# Patient Record
Sex: Female | Born: 1970 | Race: Black or African American | Hispanic: No | Marital: Single | Smoking: Former smoker
Health system: Southern US, Community
[De-identification: ages and names within clinical notes are randomized; demographics above are authoritative.]

## PROBLEM LIST (undated history)

## (undated) DIAGNOSIS — C539 Malignant neoplasm of cervix uteri, unspecified: Secondary | ICD-10-CM

## (undated) DIAGNOSIS — I5032 Chronic diastolic (congestive) heart failure: Secondary | ICD-10-CM

## (undated) DIAGNOSIS — D509 Iron deficiency anemia, unspecified: Secondary | ICD-10-CM

## (undated) DIAGNOSIS — K219 Gastro-esophageal reflux disease without esophagitis: Secondary | ICD-10-CM

## (undated) DIAGNOSIS — I10 Essential (primary) hypertension: Secondary | ICD-10-CM

## (undated) HISTORY — DX: Malignant neoplasm of cervix uteri, unspecified: C53.9

## (undated) HISTORY — PX: BREAST BIOPSY: SHX20

## (undated) HISTORY — PX: BREAST SURGERY: SHX581

---

## 2013-07-07 ENCOUNTER — Ambulatory Visit
Admission: RE | Admit: 2013-07-07 | Discharge: 2013-07-07 | Disposition: A | Discharge status: Home / Self Care | Payer: No Typology Code available for payment source | Source: Ambulatory Visit | Attending: Infectious Disease | Admitting: Infectious Disease

## 2013-07-07 ENCOUNTER — Other Ambulatory Visit: Payer: Self-pay | Admitting: Infectious Disease

## 2013-07-07 DIAGNOSIS — A150 Tuberculosis of lung: Secondary | ICD-10-CM

## 2016-02-01 ENCOUNTER — Ambulatory Visit: Payer: Self-pay | Admitting: Women's Health

## 2016-08-31 ENCOUNTER — Ambulatory Visit (HOSPITAL_COMMUNITY)
Admission: EM | Admit: 2016-08-31 | Discharge: 2016-08-31 | Disposition: A | Discharge status: Home / Self Care | Payer: Managed Care, Other (non HMO) | Attending: Family Medicine | Admitting: Family Medicine

## 2016-08-31 ENCOUNTER — Encounter (HOSPITAL_COMMUNITY): Payer: Self-pay | Admitting: Emergency Medicine

## 2016-08-31 DIAGNOSIS — M62838 Other muscle spasm: Secondary | ICD-10-CM

## 2016-08-31 DIAGNOSIS — I10 Essential (primary) hypertension: Secondary | ICD-10-CM

## 2016-08-31 HISTORY — DX: Essential (primary) hypertension: I10

## 2016-08-31 MED ORDER — DICLOFENAC SODIUM 1 % TD GEL: 4.0000 g | Freq: Four times a day (QID) | TRANSDERMAL | 2 refills | Status: DC

## 2016-08-31 MED ORDER — CYCLOBENZAPRINE HCL 10 MG PO TABS: ORAL_TABLET | ORAL | 0 refills | Status: DC

## 2016-08-31 NOTE — ED Notes (Signed)
Here for neck/upper back pain onset yest... Reports pain increase when she turns her head  Denies inj/trauma, strenuous activity, urinary or bowel incontinence.   Slow gait... A&O x4... NAD

## 2016-08-31 NOTE — Discharge Instructions (Signed)
You are having neck spasms. Treat with moist heat, use of Voltaren gel locally to area 3-4 times a day and use of muscle relaxer's (Flexeril) as needed. If you are not improving then suggest you f/u with Orthopedics.  From a blood pressure standpoint suggest establishing care with a PCP for management. Avoid high salt diet and no anti-inflammatory's like aleve or ibuprofen which can make you BP worse.

## 2016-08-31 NOTE — ED Provider Notes (Signed)
CSN: [id number]     Arrival date & time 08/31/16  1445 History   First MD Initiated Contact with Patient 08/31/16 1510     Chief Complaint  Patient presents with  . Neck Pain   (Consider location/radiation/quality/duration/timing/severity/associated sxs/prior Treatment) Patient is a 45 yo black female who carries a history of HTN. She presents today with neck pain and spasms. She reports that she awoke 2 days ago with pain upon ROM of her neck. She felt as though she "slept wrong". The pain is worsening. She reports taking Tylenol without good relief. She has no radiation of her pain into extremities. No numbness, tingling or weakness.  She reports that she has been managing her BP since arrival to the [place] 3 years ago. She has not established care with PCP. She states that her BPs are usually lower (she works as a Technical brewer and checks it often). No chest pain or dizziness. No headaches are noted.       Past Medical History:  Diagnosis Date  . Hypertension    History reviewed. No pertinent surgical history. No family history on file. Social History  Substance Use Topics  . Smoking status: Current Every Day Smoker    Packs/day: 0.50    Types: Cigarettes  . Smokeless tobacco: Never Used  . Alcohol use Yes   OB History    No data available     Review of Systems  Constitutional: Negative for chills and fever.  Eyes: Negative for visual disturbance.  Respiratory: Negative for chest tightness and shortness of breath.   Musculoskeletal: Positive for neck pain and neck stiffness.  Skin: Negative.   Neurological: Negative for dizziness, light-headedness, numbness and headaches.  Psychiatric/Behavioral: Negative.   All other systems reviewed and are negative.   Allergies  Patient has no known allergies.  Home Medications   Prior to Admission medications   Medication Sig Start Date End Date Taking? Authorizing Provider  cyclobenzaprine (FLEXERIL) 10 MG tablet 1/2 to 1 tablet every 8  hours prn muscle spasm and pain 08/31/16   Bjorn Pippin, PA-C  diclofenac sodium (VOLTAREN) 1 % GEL Apply 4 g topically 4 (four) times daily. 08/31/16   Bjorn Pippin, PA-C   Meds Ordered and Administered this Visit  Medications - No data to display  BP (!) 168/104 (BP Location: Left Arm) Comment: notified cma  Pulse 87   Temp 97.9 F (36.6 C) (Oral)   Resp 16   LMP 08/31/2016   SpO2 100%  No data found.   Physical Exam  Constitutional: She is oriented to person, place, and time. She appears well-developed and well-nourished. No distress.  HENT:  Head: Normocephalic and atraumatic.  Neck: Neck supple.  Tenderness along the right SCM with pain to limited ROM of the cervical spine.   Musculoskeletal:  Full ROm of the shoulders, good strength throughout upper extremities  Neurological: She is alert and oriented to person, place, and time. She displays normal reflexes. No sensory deficit. She exhibits normal muscle tone. Coordination normal.  Skin: Skin is warm and dry. No rash noted. She is not diaphoretic.  Psychiatric: Her behavior is normal.  Nursing note and vitals reviewed.   Urgent Care Course   Clinical Course     Procedures (including critical care time)  Labs Review Labs Reviewed - No data to display  Imaging Review No results found.   Visual Acuity Review  Right Eye Distance:   Left Eye Distance:   Bilateral Distance:  Right Eye Near:   Left Eye Near:    Bilateral Near:         MDM   1. Muscle spasms of neck   2. Elevated blood pressure reading with diagnosis of hypertension    1. Treat with muscle relaxer's, moist heat, gentle ROM. Work note given. Avoid NSAIDs in the setting of HTN. FU here or with Ortho if not improving. No indication for xray's.  2. Asymptomatic. Instructed to f/u with a PCP (some names given) ASAP for management of BP.     Bjorn Pippin, PA-C 08/31/16 [phone]

## 2017-12-25 ENCOUNTER — Other Ambulatory Visit: Payer: Self-pay | Admitting: Cardiovascular Disease

## 2017-12-25 ENCOUNTER — Ambulatory Visit
Admission: RE | Admit: 2017-12-25 | Discharge: 2017-12-25 | Disposition: A | Discharge status: Home / Self Care | Payer: 59 | Source: Ambulatory Visit | Attending: Cardiovascular Disease | Admitting: Cardiovascular Disease

## 2017-12-25 DIAGNOSIS — R05 Cough: Secondary | ICD-10-CM

## 2017-12-25 DIAGNOSIS — R059 Cough, unspecified: Secondary | ICD-10-CM

## 2018-05-27 ENCOUNTER — Ambulatory Visit (INDEPENDENT_AMBULATORY_CARE_PROVIDER_SITE_OTHER): Payer: 59 | Admitting: Family Medicine

## 2018-05-27 ENCOUNTER — Other Ambulatory Visit: Payer: Self-pay

## 2018-05-27 ENCOUNTER — Encounter: Payer: Self-pay | Admitting: Family Medicine

## 2018-05-27 VITALS — BP 162/115 | HR 69 | Temp 97.9°F | Resp 16 | Ht 67.0 in | Wt 255.6 lb

## 2018-05-27 DIAGNOSIS — Z833 Family history of diabetes mellitus: Secondary | ICD-10-CM

## 2018-05-27 DIAGNOSIS — Z131 Encounter for screening for diabetes mellitus: Secondary | ICD-10-CM | POA: Diagnosis not present

## 2018-05-27 DIAGNOSIS — R42 Dizziness and giddiness: Secondary | ICD-10-CM | POA: Diagnosis not present

## 2018-05-27 DIAGNOSIS — I10 Essential (primary) hypertension: Secondary | ICD-10-CM

## 2018-05-27 DIAGNOSIS — I517 Cardiomegaly: Secondary | ICD-10-CM

## 2018-05-27 DIAGNOSIS — R600 Localized edema: Secondary | ICD-10-CM

## 2018-05-27 NOTE — Patient Instructions (Addendum)
IF you received an x-ray today, you will receive an invoice from [facility]. Please contact [facility] at [phone] with questions or concerns regarding your invoice.   IF you received labwork today, you will receive an invoice from [facility]. Please contact [facility] at [phone] with questions or concerns regarding your invoice.   Our billing staff will not be able to assist you with questions regarding bills from these companies.  You will be contacted with the lab results as soon as they are available. The fastest way to get your results is to activate your My Chart account. Instructions are located on the last page of this paperwork. If you have not heard from [place] regarding the results in 2 weeks, please contact this office.    We recommend that you schedule a mammogram for breast cancer screening. Typically, you do not need a referral to do this. Please contact a local imaging center to schedule your mammogram.  [facility] - [phone]  *ask for the Radiology Department [facility] ([place]) - [phone] or [phone]  [facility] - [phone] [place] [phone] [facility] - [phone]  *ask for the [facility] - [phone]  *ask for the Radiology Department [facility] - [phone]  *ask for the [facility] - [phone] Cancer Screening for Women A cancer screening is a test or exam that checks for cancer. Your health care provider will recommend specific cancer screenings based on your age, personal history, and family history of cancer. Work with your health care provider to create a cancer screening schedule that protects your health. Why is cancer screening done? Cancer screening is done to look for cancer in the very early stages, before it spreads and becomes harder to treat and  before you would start to notice symptoms. Finding cancer early improves the chances of successful treatment. It may save your life. Who should be screened for cancer? All women should be screened for certain cancers, including breast cancer, cervical cancer, and skin cancer. Your health care provider may recommend screenings for other types of cancer if:  You had cancer before.  You have a family member with cancer.  You have abnormal genes that could increase the risk of cancer.  You have risk factors for certain cancers, such as smoking.  When you should be screened for cancer depends on:  Your age.  Your medical history and your family's medical history.  Certain lifestyle factors, such as smoking.  Environmental exposure, such as to asbestos.  What are some common cancer screenings? Breast cancer Breast cancer screening is done with a test that takes images of breast tissue (mammogram). Here are some screening guidelines:  When you are age 88-44. you will be given the choice to start having mammograms.  When you are age 51-54, you should have a mammogram every year.  You may start having mammograms before age 38 if you have risk factors for breast cancer, such as having an immediate family member with breast cancer.  When you are age 47 or older, you should have a mammogram every 1-2 years for as long as you are in good health and have a life expectancy of 10 years or more.  It is important to know what your breasts look and feel like so you can report any changes to your health care provider.  Cervical cancer Cervical cancer screening  is done with a Pap test. This testchecks for abnormalities, including the virus that causes cervical cancer (human papillomavirus, or HPV). To perform the test, a health care provider takes a swab of cervical cells during a pelvic exam. Screening for cervical cancer with a Pap test should start at age 63. Here are some screening  guidelines:  When you are age 49-29, you should have a Pap test every 3 years.  When you are age 70-65, you should have a Pap test and HPV test every 5 years or have a Pap test every 3 years.  You may be screened for cervical cancer more often if you have risk factors for cervical cancer.  If your Pap tests are abnormal, you may have an HPV test.  If you have had the HPV vaccine, you will still be screened for cervical cancer and follow normal screening recommendations.  You do not need to be screened for cervical cancer if any of the following apply to you:  You are older than age 76 and you have not had a serious cervical precancer or cancer in the last 20 years.  Your cervix and uterus have been removed and you have never had cervical cancer or precancerous cells.  Endometrial cancer There is no standard screening test for endometrial cancer, but the cancer can be detected with:  A test of a sample of tissue taken from the lining of the uterus (endometrial tissue biopsy).  A vaginal ultrasound.  Pap tests.  If you are at increased risk for endometrial cancer, you may need to have these tests more often than normal. You are at increased risk if:  You have a family history of ovarian, uterine, or colon cancer.  You are taking tamoxifen, a drug that is used to treat breast cancer.  You have certain types of colon cancer.  If you have reached menopause, it is especially important to talk with your health care provider about any vaginal bleeding or spotting. Screening for endometrial cancer is not recommended for women who do not have symptoms of the cancer, such as vaginal bleeding. Colorectal cancer Screening for colorectal cancer is recommended starting at age 67 for most women. If you have a family history of colon or rectal cancer or other risk factors, you may need to start having screenings earlier. Talk with your health care provider about which screening test is right for  you and how often you should be screened. Colorectal cancer screening looks for cancer or for growths called polyps that often form before cancer starts. Tests to look for cancer or polyps include:  Colonoscopy or flexible sigmoidoscopy. For these procedures, a flexible tube with a small camera is inserted into the rectum.  CT colonography. This test uses X-rays and a contrast dye to check the colon for polyps. If a polyp is found, you may need to have a colonoscopy so the polyp can be located and removed.  Tests to look for cancer in the stool (feces) include:  Guaiac-based fecal occult blood test (FOBT). This test detects blood in stool. It can be done at home with a kit.  Fecal immunochemical test (FIT). This test detects blood in stool. For this test, you will need to collect stool samples at home.  Stool DNA test. This test looks for blood in stool and any changes in DNA that can lead to colon cancer. For this test, you will need to collect a stool sample at home and send it to a lab.  Skin cancer Skin cancer screening is done by checking the skin for unusual moles or spots and any changes in existing moles. Your health care provider should check your skin for signs of skin cancer at every physical exam. You should check your skin every month and tell your health care provider right away if anything looks unusual. Women with a higher-than-normal risk for skin cancer may want to see a skin specialist (dermatologist) for an annual body check. Lung cancer Lung cancer screening is done with a CT scan that looks for abnormal cells in the lungs. Discuss lung cancer screening with your health care provider if you are 13-29 years old and if any of the following apply to you:  You currently smoke.  You used to smoke heavily.  You have had at least a 30-pack-year smoking history.  You have quit smoking within the past 15 years.  If you smoke heavily or if you used to smoke, you may need to be  screened every year. Where to find more information:  [place]: SkinPromotion.no  Centers for Disease Control and Prevention: http://knight-sullivan.biz/  Department of Health and Human Services: BankingDetective.si Contact a health care provider if:  You have concerns about any signs or symptoms of cancer, such as: ? Moles that have an unusual shape or color. ? Changes in existing moles. ? A sore on your skin that does not heal. ? Blood in your stool. ? Fatigue that does not go away. ? Frequent pain or cramping in your abdomen. ? Coughing, or coughing up blood. ? Losing weight without trying. ? Lumps or other changes in your breasts. ? Vaginal bleeding, spotting, or changes in your periods. Summary  Be aware of and watch for signs and symptoms of cancer, especially symptoms of breast cancer, cervical cancer, endometrial cancer, colorectal cancer, skin cancer, and lung cancer.  Early detection of cancer with cancer screening may save your life.  Talk with your health care provider about your specific cancer risks.  Work together with your health care provider to create a cancer screening plan that is right for you. This information is not intended to replace advice given to you by your health care provider. Make sure you discuss any questions you have with your health care provider. Document Released: 06/28/2016 Document Revised: 06/28/2016 Document Reviewed: 06/28/2016 Elsevier Interactive Patient Education  [employer].

## 2018-05-27 NOTE — Progress Notes (Signed)
Chief Complaint  Patient presents with  . New Patient (Initial Visit)    HPI  Hypertension: Patient here to establish care for Hypertension.   She reports that even when she takes all her medication he pressure is still high She reports that she gets some chest tightness even when she takes her medications Her bp makes her feel tired and dizzy She had a stress test with the Cardiologist. Her last visit was 2 week ago. 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   Health Maintenance She reports that she has not been checked for diabetes or high cholesterol She has not had a mammogram and pap smear She has been [id number], one passed away, 23 78 son and 17 102 daughter   Past Medical History:  Diagnosis Date  . Hypertension     Current Outpatient Medications  Medication Sig Dispense Refill  . albuterol (PROVENTIL HFA;VENTOLIN HFA) 108 (90 Base) MCG/ACT inhaler Inhale into the lungs every 6 (six) hours as needed for wheezing or shortness of breath.    . aMarland KitchenLODipine (NORVASC) 5 MG tablet Take 5 mg by mouth daily.    . hydrALAZINE (APRESOLINE) 25 MG tablet Take 25 mg by mouth 3 (three) times daily.    . lMarland Kitchensartan-hydrochlorothiazide (HYZAAR) 100-12.5 MG tablet Take 1 tablet by mouth daily.    . metoprolol tartrate (LOPRESSOR) 25 MG tablet Take 25 mg by mouth 2  (two) times daily.    . potassium chloride (K-DUR) 10 MEQ tablet Take 10 mEq by mouth daily.    . cyclobenzaprine (FLEXERIL) 10 MG tablet 1/2 to 1 tablet every 8 hours prn muscle spasm and pain (Patient not taking: Reported on 05/27/2018) 20 tablet 0  . diclofenac sodium (VOLTAREN) 1 % GEL Apply 4 g topically 4 (four) times daily. (Patient not taking: Reported on 05/27/2018) 1 Tube 2   No current facility-administered medications for this visit.     Allergies: No Known Allergies  No past surgical history on file.  Social History   Socioeconomic History  . Marital status: Single    Spouse name: Not on file  . Number of children: Not on file  . Years of education: Not on file  . Highest education level: Not on file  Occupational History  . Not on file  Social Needs  . Financial resource strain: Not on file  . Food insecurity:    Worry: Not on file    Inability: Not on file  . Transportation needs:    Medical: Not on file    Non-medical: Not on file  Tobacco Use  . Smoking status: Never Smoker  . Smokeless tobacco: Never Used  Substance and Sexual Activity  . Alcohol use: Yes  . Drug use: Never  . Sexual activity: Not on file  Lifestyle  .  Physical activity:    Days per week: Not on file    Minutes per session: Not on file  . Stress: Not on file  Relationships  . Social connections:    Talks on phone: Not on file    Gets together: Not on file    Attends religious service: Not on file    Active member of club or organization: Not on file    Attends meetings of clubs or organizations: Not on file    Relationship status: Not on file  Other Topics Concern  . Not on file  Social History Narrative  . Not on file    Family History  Problem Relation Age of Onset  . Hypertension Mother   . Hypertension Father   . Diabetes Father      ROS Review of Systems See HPI Constitution: No fevers or chills No malaise No diaphoresis Skin: No rash or itching Eyes: no  blurry vision, no double vision GU: no dysuria or hematuria Neuro: no dizziness or headaches  all others reviewed and negative   Objective: Vitals:   05/27/18 1133  BP: (!) 162/115  Pulse: 69  Resp: 16  Temp: 97.9 F (36.6 C)  TempSrc: Oral  SpO2: 98%  Weight: 255 lb 9.6 oz (115.9 kg)  Height: _0  (1.702 m)    Physical Exam  Constitutional: She is oriented to person, place, and time. She appears well-developed and well-nourished.  HENT:  Head: Normocephalic and atraumatic.  Right Ear: External ear normal.  Left Ear: External ear normal.  Nose: Nose normal.  Mouth/Throat: Oropharynx is clear and moist.  Eyes: Conjunctivae and EOM are normal.  Neck: Normal range of motion. Neck supple.  Cardiovascular: Normal rate, regular rhythm and normal heart sounds.  No murmur heard. Pulmonary/Chest: Effort normal and breath sounds normal. No stridor. No respiratory distress. She has no wheezes.  Abdominal: Soft. Bowel sounds are normal. She exhibits no distension and no mass. There is no tenderness. There is no guarding.  Musculoskeletal: She exhibits edema.  Pitting edema to the ankle, trace from the ankle to mid leg bilaterally  Neurological: She is alert and oriented to person, place, and time.  Skin: Skin is warm. Capillary refill takes less than 2 seconds.  Psychiatric: She has a normal mood and affect. Her behavior is normal. Judgment and thought content normal.   ECG- LVH criteria met, no st elevation     CLINICAL DATA:  Cough.  EXAM: CHEST - 2 VIEW  COMPARISON:  04/06/2013.  FINDINGS: Mediastinum hilar structures normal. Lungs are clear. Stable cardiomegaly. No pulmonary venous congestion. No acute bony abnormality.  IMPRESSION: 1.  Stable cardiomegaly.  No pulmonary venous congestion.  2.  No acute pulmonary disease P   Electronically Signed   By: Marcello Moores  Register   On: 12/26/2017 07:31   Assessment and Plan Aerielle was seen today for new  patient (initial visit).  Diagnoses and all orders for this visit:  Malignant hypertension- will get records from cardiology Close follow up in one month Pt recently had meds changed by cardiology -     EKG 12-Lead -     Comprehensive metabolic panel -     Microalbumin, urine -     Lipid panel  Cardiomegaly- will get records from Cardiology  Dizziness -     EKG 12-Lead  Screening for diabetes mellitus -     Hemoglobin A1c  Family history of diabetes mellitus in father -     Hemoglobin A1c  Severe obesity (BMI >= 40) ([place])- will review stress test result before recommending exercise program   Lower extremity edema- advised DASH diet, compression stockings Waiting on labs      [place]

## 2018-05-28 LAB — LIPID PANEL
CHOL/HDL RATIO: 3.8 ratio (ref 0.0–4.4)
Cholesterol, Total: 184 mg/dL (ref 100–199)
HDL: 48 mg/dL (ref >39)
LDL Calculated: 125 mg/dL — ABNORMAL HIGH (ref 0–99)
TRIGLYCERIDES: 54 mg/dL (ref 0–149)
VLDL Cholesterol Cal: 11 mg/dL (ref 5–40)

## 2018-05-28 LAB — COMPREHENSIVE METABOLIC PANEL
ALT: 16 IU/L (ref 0–32)
AST: 20 IU/L (ref 0–40)
Albumin/Globulin Ratio: 1.5 (ref 1.2–2.2)
Albumin: 4.3 g/dL (ref 3.5–5.5)
Alkaline Phosphatase: 53 IU/L (ref 39–117)
BILIRUBIN TOTAL: 0.3 mg/dL (ref 0.0–1.2)
BUN/Creatinine Ratio: 20 (ref 9–23)
BUN: 12 mg/dL (ref 6–24)
CHLORIDE: 102 mmol/L (ref 96–106)
CO2: 26 mmol/L (ref 20–29)
Calcium: 9.4 mg/dL (ref 8.7–10.2)
Creatinine, Ser: 0.59 mg/dL (ref 0.57–1.00)
GFR calc non Af Amer: 109 mL/min/{1.73_m2} (ref >59)
GFR, EST AFRICAN AMERICAN: 126 mL/min/{1.73_m2} (ref >59)
GLOBULIN, TOTAL: 2.8 g/dL (ref 1.5–4.5)
Glucose: 86 mg/dL (ref 65–99)
Potassium: 4.2 mmol/L (ref 3.5–5.2)
SODIUM: 140 mmol/L (ref 134–144)
TOTAL PROTEIN: 7.1 g/dL (ref 6.0–8.5)

## 2018-05-28 LAB — HEMOGLOBIN A1C
ESTIMATED AVERAGE GLUCOSE: 108 mg/dL
HEMOGLOBIN A1C: 5.4 % (ref 4.8–5.6)

## 2018-05-28 LAB — MICROALBUMIN, URINE: Microalbumin, Urine: 34.7 ug/mL

## 2018-06-05 ENCOUNTER — Encounter: Payer: Self-pay | Admitting: Family Medicine

## 2018-06-16 ENCOUNTER — Ambulatory Visit (INDEPENDENT_AMBULATORY_CARE_PROVIDER_SITE_OTHER): Payer: 59

## 2018-06-16 ENCOUNTER — Ambulatory Visit (HOSPITAL_COMMUNITY)
Admission: EM | Admit: 2018-06-16 | Discharge: 2018-06-16 | Disposition: A | Discharge status: Home / Self Care | Payer: 59 | Attending: Family Medicine | Admitting: Family Medicine

## 2018-06-16 ENCOUNTER — Encounter (HOSPITAL_COMMUNITY): Payer: Self-pay

## 2018-06-16 DIAGNOSIS — R52 Pain, unspecified: Secondary | ICD-10-CM

## 2018-06-16 DIAGNOSIS — M25551 Pain in right hip: Secondary | ICD-10-CM | POA: Diagnosis not present

## 2018-06-16 MED ORDER — NAPROXEN 500 MG PO TABS: 500.0000 mg | ORAL_TABLET | Freq: Two times a day (BID) | ORAL | 0 refills | Status: DC

## 2018-06-16 MED ORDER — KETOROLAC TROMETHAMINE 30 MG/ML IJ SOLN
INTRAMUSCULAR | Status: AC
  Filled 2018-06-16: qty 1

## 2018-06-16 MED ORDER — KETOROLAC TROMETHAMINE 30 MG/ML IJ SOLN
30.0000 mg | Freq: Once | INTRAMUSCULAR | Status: AC
  Administered 2018-06-16: 30 mg via INTRAMUSCULAR

## 2018-06-16 NOTE — ED Notes (Signed)
donjoy crutches given.

## 2018-06-16 NOTE — ED Provider Notes (Signed)
[place]    CSN: [id number] Arrival date & time: 06/16/18  1517     History   Chief Complaint Chief Complaint  Patient presents with  . Leg Pain    Right  . Knee Pain    Right    HPI Kayla Carey is a 47 y.o. female.   The history is provided by the patient. No language interpreter was used.  Hip Pain  This is a new problem. The current episode started yesterday (She was trying to get into the car yesterday and she twisted her right hip wrong and heard a pop. Since then she has been in pain associated with right hip swelling and difficulty ambulating). The problem occurs constantly. The problem has been gradually worsening. The symptoms are aggravated by bending, twisting, standing and walking. The symptoms are relieved by rest. Treatments tried: Muscle relaxant. The treatment provided mild relief.  HTN: On medication. She missed her PM dose today due to severe pain.Denies any other symptoms.  Past Medical History:  Diagnosis Date  . Hypertension     There are no active problems to display for this patient.   History reviewed. No pertinent surgical history.  OB History   None      Home Medications    Prior to Admission medications   Medication Sig Start Date End Date Taking? Authorizing Provider  albuterol (PROVENTIL HFA;VENTOLIN HFA) 108 (90 Base) MCG/ACT inhaler Inhale into the lungs every 6 (six) hours as needed for wheezing or shortness of breath.    [provider]  amLODipine (NORVASC) 5 MG tablet Take 5 mg by mouth daily.    [provider]  cyclobenzaprine (FLEXERIL) 10 MG tablet 1/2 to 1 tablet every 8 hours prn muscle spasm and pain Patient not taking: Reported on 05/27/2018 08/31/16   Bjorn Pippin, PA-C  diclofenac sodium (VOLTAREN) 1 % GEL Apply 4 g topically 4 (four) times daily. Patient not taking: Reported on 05/27/2018 08/31/16   Bjorn Pippin, PA-C  hydrALAZINE (APRESOLINE) 25 MG tablet Take 25 mg by mouth  3 (three) times daily.    [provider]  losartan-hydrochlorothiazide (HYZAAR) 100-12.5 MG tablet Take 1 tablet by mouth daily.    [provider]  metoprolol tartrate (LOPRESSOR) 25 MG tablet Take 25 mg by mouth 2 (two) times daily.    [provider]  potassium chloride (K-DUR) 10 MEQ tablet Take 10 mEq by mouth daily.    [provider]    Family History Family History  Problem Relation Age of Onset  . Hypertension Mother   . Hypertension Father   . Diabetes Father     Social History Social History   Tobacco Use  . Smoking status: Never Smoker  . Smokeless tobacco: Never Used  Substance Use Topics  . Alcohol use: Yes  . Drug use: Never     Allergies   Patient has no known allergies.   Review of Systems Review of Systems  Respiratory: Negative.   Cardiovascular: Negative.   Gastrointestinal: Negative.   Musculoskeletal: Positive for arthralgias, gait problem and joint swelling.       Right hip pain and swelling  All other systems reviewed and are negative.    Physical Exam Triage Vital Signs ED Triage Vitals  Enc Vitals Group     BP 06/16/18 1604 (!) 148/101     Pulse Rate 06/16/18 1604 80     Resp 06/16/18 1604 20     Temp 06/16/18 1604 98.3  F (36.8 C)     Temp Source 06/16/18 1604 Oral     SpO2 06/16/18 1604 100 %     Weight --      Height --      Head Circumference --      Peak Flow --      Pain Score 06/16/18 1603 10     Pain Loc --      Pain Edu? --      Excl. in [place]? --    No data found.  Updated Vital Signs BP (!) 148/101 (BP Location: Left Arm)   Pulse 80   Temp 98.3 F (36.8 C) (Oral)   Resp 20   SpO2 100%   Visual Acuity Right Eye Distance:   Left Eye Distance:   Bilateral Distance:    Right Eye Near:   Left Eye Near:    Bilateral Near:     Physical Exam  Constitutional:  In mild distress due to pain  Cardiovascular: Normal rate and regular rhythm.  No murmur heard. Pulmonary/Chest:  Effort normal and breath sounds normal. No stridor. No respiratory distress. She has no wheezes.  Abdominal: Soft. Bowel sounds are normal. She exhibits no distension. There is no tenderness.  Musculoskeletal:       Right hip: She exhibits decreased range of motion, tenderness and swelling. She exhibits normal strength, no crepitus and no deformity.       Left hip: Normal.       Right knee: Normal.       Left knee: Normal.  Neurological: She displays no tremor. No sensory deficit. Gait abnormal.  Antalgic gait favoring right LL due to pain  Nursing note and vitals reviewed.    UC Treatments / Results  Labs (all labs ordered are listed, but only abnormal results are displayed) Labs Reviewed - No data to display  EKG None  Radiology No results found.  Procedures Procedures (including critical care time)  Medications Ordered in UC Medications - No data to display  Initial Impression / Assessment and Plan / UC Course  I have reviewed the triage vital signs and the nursing notes.  Pertinent labs & imaging results that were available during my care of the patient were reviewed by me and considered in my medical decision making (see chart for details).  Clinical Course as of Jun 17 1703  Mon Jun 16, 2018  1702 Hip xray neg for dislocation. As discussed with her, MRI might be the best option given her pain.  She declined ED visit at this point. Naproxen prescribed prn pain. Crutches given. Return precaution discussed.   [KE]  1703 HTN: Worsened by pain. Also did not take her meds. Advised to take BP meds as soon as possible. She agreed with the plan./   [KE]    Clinical Course User Index [KE] Kinnie Feil, MD   Dg Hip Unilat With Pelvis 2-3 Views Right  Result Date: 06/16/2018 CLINICAL DATA:  Twisted the right leg now with right hip pain EXAM: DG HIP (WITH OR WITHOUT PELVIS) 2-3V RIGHT COMPARISON:  None. FINDINGS: SI joints are non widened. Pubic symphysis and rami  appear intact. No fracture or malalignment. IMPRESSION: No acute osseous abnormality. Electronically Signed   By: Donavan Foil M.D.   On: 06/16/2018 16:47    Pain of right hip joint  Pain - Plan: DG Hip Unilat With Pelvis 2-3 Views Right, DG Hip Unilat With Pelvis 2-3 Views Right   Final Clinical Impressions(s) / UC  Diagnoses   Final diagnoses:  None   Discharge Instructions   None    ED Prescriptions    None     Controlled Substance Prescriptions [place] Controlled Substance Registry consulted? Not Applicable   Kinnie Feil, MD 06/16/18 1704

## 2018-06-16 NOTE — ED Triage Notes (Signed)
Pt presents with pain in right thigh and knee from turning the wrong way to get into car yesterday.

## 2018-06-16 NOTE — Discharge Instructions (Addendum)
It was nice seeing you today. Your hip xray is negative,. Use Naproxen as needed for pain. Keep minimal weight on your hip. Please go to the ED for MRI of your hip if pain is not improving.

## 2018-06-25 ENCOUNTER — Other Ambulatory Visit: Payer: Self-pay

## 2018-06-25 ENCOUNTER — Ambulatory Visit (INDEPENDENT_AMBULATORY_CARE_PROVIDER_SITE_OTHER): Payer: 59 | Admitting: Family Medicine

## 2018-06-25 ENCOUNTER — Encounter: Payer: Self-pay | Admitting: Family Medicine

## 2018-06-25 VITALS — BP 147/89 | HR 88 | Temp 99.0°F | Resp 17 | Ht 67.0 in | Wt 256.6 lb

## 2018-06-25 DIAGNOSIS — Z0000 Encounter for general adult medical examination without abnormal findings: Secondary | ICD-10-CM | POA: Diagnosis not present

## 2018-06-25 DIAGNOSIS — Z01419 Encounter for gynecological examination (general) (routine) without abnormal findings: Secondary | ICD-10-CM

## 2018-06-25 DIAGNOSIS — R87810 Cervical high risk human papillomavirus (HPV) DNA test positive: Secondary | ICD-10-CM

## 2018-06-25 DIAGNOSIS — R87613 High grade squamous intraepithelial lesion on cytologic smear of cervix (HGSIL): Secondary | ICD-10-CM

## 2018-06-25 NOTE — Patient Instructions (Addendum)
We recommend that you schedule a mammogram for breast cancer screening. Typically, you do not need a referral to do this. Please contact a local imaging center to schedule your mammogram.  [facility] - [phone]  *ask for the Radiology [place] ([place]) - [phone] or [phone]  [facility] - [phone] [phone] [facility] - [phone]  *ask for the [facility] - [phone]  *ask for the Radiology Department [facility] - [phone]  *ask for the [place] - [phone]     If you have lab work done today you will be contacted with your lab results within the next 2 weeks.  If you have not heard from [place] then please contact us. The fastest way to get your results is to register for My Chart.   IF you received an x-ray today, you will receive an invoice from [facility]. Please contact [facility] Radiology at [phone] with questions or concerns regarding your invoice.   IF you received labwork today, you will receive an invoice from [place]. Please contact [facility] at [phone] with questions or concerns regarding your invoice.   Our billing staff will not be able to assist you with questions regarding bills from these companies.  You will be contacted with the lab results as soon as they are available. The fastest way to get your results is to activate your My Chart account. Instructions are located on the last page of this paperwork. If you have not heard from [place] regarding the results in 2 weeks, please contact this office.    Health Maintenance, Female Adopting a healthy lifestyle and getting preventive care can go a long way to promote health and wellness. Talk with your health care provider about what schedule of regular examinations is right for you. This is a good  chance for you to check in with your provider about disease prevention and staying healthy. In between checkups, there are plenty of things you can do on your own. Experts have done a lot of research about which lifestyle changes and preventive measures are most likely to keep you healthy. Ask your health care provider for more information. Weight and diet Eat a healthy diet  Be sure to include plenty of vegetables, fruits, low-fat dairy products, and lean protein.  Do not eat a lot of foods high in solid fats, added sugars, or salt.  Get regular exercise. This is one of the most important things you can do for your health. ? Most adults should exercise for at least 150 minutes each week. The exercise should increase your heart rate and make you sweat (moderate-intensity exercise). ? Most adults should also do strengthening exercises at least twice a week. This is in addition to the moderate-intensity exercise.  Maintain a healthy weight  Body mass index (BMI) is a measurement that can be used to identify possible weight problems. It estimates body fat based on height and weight. Your health care provider can help determine your BMI and help you achieve or maintain a healthy weight.  For females 51 years of age and older: ? A BMI below 18.5 is considered underweight. ? A BMI of 18.5 to 24.9 is normal. ? A BMI of 25 to 29.9 is considered overweight. ? A BMI of 30 and above is considered obese.  Watch levels of cholesterol and blood lipids  You should start having  your blood tested for lipids and cholesterol at 47 years of age, then have this test every 5 years.  You may need to have your cholesterol levels checked more often if: ? Your lipid or cholesterol levels are high. ? You are older than 47 years of age. ? You are at high risk for heart disease.  Cancer screening Lung Cancer  Lung cancer screening is recommended for adults 29-25 years old who are at high risk for lung cancer  because of a history of smoking.  A yearly low-dose CT scan of the lungs is recommended for people who: ? Currently smoke. ? Have quit within the past 15 years. ? Have at least a 30-pack-year history of smoking. A pack year is smoking an average of one pack of cigarettes a day for 1 year.  Yearly screening should continue until it has been 15 years since you quit.  Yearly screening should stop if you develop a health problem that would prevent you from having lung cancer treatment.  Breast Cancer  Practice breast self-awareness. This means understanding how your breasts normally appear and feel.  It also means doing regular breast self-exams. Let your health care provider know about any changes, no matter how small.  If you are in your 20s or 30s, you should have a clinical breast exam (CBE) by a health care provider every 1-3 years as part of a regular health exam.  If you are 44 or older, have a CBE every year. Also consider having a breast X-ray (mammogram) every year.  If you have a family history of breast cancer, talk to your health care provider about genetic screening.  If you are at high risk for breast cancer, talk to your health care provider about having an MRI and a mammogram every year.  Breast cancer gene (BRCA) assessment is recommended for women who have family members with BRCA-related cancers. BRCA-related cancers include: ? Breast. ? Ovarian. ? Tubal. ? Peritoneal cancers.  Results of the assessment will determine the need for genetic counseling and BRCA1 and BRCA2 testing.  Cervical Cancer Your health care provider may recommend that you be screened regularly for cancer of the pelvic organs (ovaries, uterus, and vagina). This screening involves a pelvic examination, including checking for microscopic changes to the surface of your cervix (Pap test). You may be encouraged to have this screening done every 3 years, beginning at age 38.  For women ages 37-65,  health care providers may recommend pelvic exams and Pap testing every 3 years, or they may recommend the Pap and pelvic exam, combined with testing for human papilloma virus (HPV), every 5 years. Some types of HPV increase your risk of cervical cancer. Testing for HPV may also be done on women of any age with unclear Pap test results.  Other health care providers may not recommend any screening for nonpregnant women who are considered low risk for pelvic cancer and who do not have symptoms. Ask your health care provider if a screening pelvic exam is right for you.  If you have had past treatment for cervical cancer or a condition that could lead to cancer, you need Pap tests and screening for cancer for at least 20 years after your treatment. If Pap tests have been discontinued, your risk factors (such as having a new sexual partner) need to be reassessed to determine if screening should resume. Some women have medical problems that increase the chance of getting cervical cancer. In these cases, your health care  provider may recommend more frequent screening and Pap tests.  Colorectal Cancer  This type of cancer can be detected and often prevented.  Routine colorectal cancer screening usually begins at 47 years of age and continues through 47 years of age.  Your health care provider may recommend screening at an earlier age if you have risk factors for colon cancer.  Your health care provider may also recommend using home test kits to check for hidden blood in the stool.  A small camera at the end of a tube can be used to examine your colon directly (sigmoidoscopy or colonoscopy). This is done to check for the earliest forms of colorectal cancer.  Routine screening usually begins at age 27.  Direct examination of the colon should be repeated every 5-10 years through 47 years of age. However, you may need to be screened more often if early forms of precancerous polyps or small growths are  found.  Skin Cancer  Check your skin from head to toe regularly.  Tell your health care provider about any new moles or changes in moles, especially if there is a change in a mole's shape or color.  Also tell your health care provider if you have a mole that is larger than the size of a pencil eraser.  Always use sunscreen. Apply sunscreen liberally and repeatedly throughout the day.  Protect yourself by wearing long sleeves, pants, a wide-brimmed hat, and sunglasses whenever you are outside.  Heart disease, diabetes, and high blood pressure  High blood pressure causes heart disease and increases the risk of stroke. High blood pressure is more likely to develop in: ? People who have blood pressure in the high end of the normal range (130-139/85-89 mm Hg). ? People who are overweight or obese. ? People who are African American.  If you are 44-30 years of age, have your blood pressure checked every 3-5 years. If you are 88 years of age or older, have your blood pressure checked every year. You should have your blood pressure measured twice-once when you are at a hospital or clinic, and once when you are not at a hospital or clinic. Record the average of the two measurements. To check your blood pressure when you are not at a hospital or clinic, you can use: ? An automated blood pressure machine at a pharmacy. ? A home blood pressure monitor.  If you are between 43 years and 75 years old, ask your health care provider if you should take aspirin to prevent strokes.  Have regular diabetes screenings. This involves taking a blood sample to check your fasting blood sugar level. ? If you are at a normal weight and have a low risk for diabetes, have this test once every three years after 47 years of age. ? If you are overweight and have a high risk for diabetes, consider being tested at a younger age or more often. Preventing infection Hepatitis B  If you have a higher risk for hepatitis B,  you should be screened for this virus. You are considered at high risk for hepatitis B if: ? You were born in a country where hepatitis B is common. Ask your health care provider which countries are considered high risk. ? Your parents were born in a high-risk country, and you have not been immunized against hepatitis B (hepatitis B vaccine). ? You have HIV or AIDS. ? You use needles to inject street drugs. ? You live with someone who has hepatitis B. ? You  have had sex with someone who has hepatitis B. ? You get hemodialysis treatment. ? You take certain medicines for conditions, including cancer, organ transplantation, and autoimmune conditions.  Hepatitis C  Blood testing is recommended for: ? Everyone born from [birth year] through 1965. ? Anyone with known risk factors for hepatitis C.  Sexually transmitted infections (STIs)  You should be screened for sexually transmitted infections (STIs) including gonorrhea and chlamydia if: ? You are sexually active and are younger than 47 years of age. ? You are older than 47 years of age and your health care provider tells you that you are at risk for this type of infection. ? Your sexual activity has changed since you were last screened and you are at an increased risk for chlamydia or gonorrhea. Ask your health care provider if you are at risk.  If you do not have HIV, but are at risk, it may be recommended that you take a prescription medicine daily to prevent HIV infection. This is called pre-exposure prophylaxis (PrEP). You are considered at risk if: ? You are sexually active and do not regularly use condoms or know the HIV status of your partner(s). ? You take drugs by injection. ? You are sexually active with a partner who has HIV.  Talk with your health care provider about whether you are at high risk of being infected with HIV. If you choose to begin PrEP, you should first be tested for HIV. You should then be tested every 3 months for as long  as you are taking PrEP. Pregnancy  If you are premenopausal and you may become pregnant, ask your health care provider about preconception counseling.  If you may become pregnant, take 400 to 800 micrograms (mcg) of folic acid every day.  If you want to prevent pregnancy, talk to your health care provider about birth control (contraception). Osteoporosis and menopause  Osteoporosis is a disease in which the bones lose minerals and strength with aging. This can result in serious bone fractures. Your risk for osteoporosis can be identified using a bone density scan.  If you are 23 years of age or older, or if you are at risk for osteoporosis and fractures, ask your health care provider if you should be screened.  Ask your health care provider whether you should take a calcium or vitamin D supplement to lower your risk for osteoporosis.  Menopause may have certain physical symptoms and risks.  Hormone replacement therapy may reduce some of these symptoms and risks. Talk to your health care provider about whether hormone replacement therapy is right for you. Follow these instructions at home:  Schedule regular health, dental, and eye exams.  Stay current with your immunizations.  Do not use any tobacco products including cigarettes, chewing tobacco, or electronic cigarettes.  If you are pregnant, do not drink alcohol.  If you are breastfeeding, limit how much and how often you drink alcohol.  Limit alcohol intake to no more than 1 drink per day for nonpregnant women. One drink equals 12 ounces of beer, 5 ounces of wine, or 1 ounces of hard liquor.  Do not use street drugs.  Do not share needles.  Ask your health care provider for help if you need support or information about quitting drugs.  Tell your health care provider if you often feel depressed.  Tell your health care provider if you have ever been abused or do not feel safe at home. This information is not intended to  replace advice  given to you by your health care provider. Make sure you discuss any questions you have with your health care provider. Document Released: 04/16/2011 Document Revised: 03/08/2016 Document Reviewed: 07/05/2015 Elsevier Interactive Patient Education  [employer].

## 2018-06-25 NOTE — Progress Notes (Signed)
Chief Complaint  Patient presents with  . Annual Exam    cpe with pap    Subjective:  Kayla Carey is a 47 y.o. female here for a health maintenance visit.  Patient is established pt  There are no active problems to display for this patient.   Past Medical History:  Diagnosis Date  . Hypertension     No past surgical history on file.   Outpatient Medications Prior to Visit  Medication Sig Dispense Refill  . albuterol (PROVENTIL HFA;VENTOLIN HFA) 108 (90 Base) MCG/ACT inhaler Inhale into the lungs every 6 (six) hours as needed for wheezing or shortness of breath.    Marland Kitchen amLODipine (NORVASC) 5 MG tablet Take 5 mg by mouth daily.    . hydrALAZINE (APRESOLINE) 25 MG tablet Take 25 mg by mouth 3 (three) times daily.    Marland Kitchen losartan-hydrochlorothiazide (HYZAAR) 100-12.5 MG tablet Take 1 tablet by mouth daily.    . metoprolol tartrate (LOPRESSOR) 25 MG tablet Take 25 mg by mouth 2 (two) times daily.    . naproxen (NAPROSYN) 500 MG tablet Take 1 tablet (500 mg total) by mouth 2 (two) times daily. 30 tablet 0  . potassium chloride (K-DUR) 10 MEQ tablet Take 10 mEq by mouth daily.    . cyclobenzaprine (FLEXERIL) 10 MG tablet 1/2 to 1 tablet every 8 hours prn muscle spasm and pain (Patient not taking: Reported on 05/27/2018) 20 tablet 0  . diclofenac sodium (VOLTAREN) 1 % GEL Apply 4 g topically 4 (four) times daily. (Patient not taking: Reported on 05/27/2018) 1 Tube 2   No facility-administered medications prior to visit.     No Known Allergies   Family History  Problem Relation Age of Onset  . Hypertension Mother   . Hypertension Father   . Diabetes Father      Health Habits: Dental Exam: up to date Eye Exam: up to date Exercise: 5 times/week on average Current exercise activities: walking/running   Social History   Socioeconomic History  . Marital status: Single    Spouse name: Not on file  . Number of children: Not on file  . Years of education: Not on file  .  Highest education level: Not on file  Occupational History  . Not on file  Social Needs  . Financial resource strain: Not on file  . Food insecurity:    Worry: Not on file    Inability: Not on file  . Transportation needs:    Medical: Not on file    Non-medical: Not on file  Tobacco Use  . Smoking status: Never Smoker  . Smokeless tobacco: Never Used  Substance and Sexual Activity  . Alcohol use: Yes  . Drug use: Never  . Sexual activity: Not on file  Lifestyle  . Physical activity:    Days per week: Not on file    Minutes per session: Not on file  . Stress: Not on file  Relationships  . Social connections:    Talks on phone: Not on file    Gets together: Not on file    Attends religious service: Not on file    Active member of club or organization: Not on file    Attends meetings of clubs or organizations: Not on file    Relationship status: Not on file  . Intimate partner violence:    Fear of current or ex partner: Not on file    Emotionally abused: Not on file    Physically abused: Not on file  Forced sexual activity: Not on file  Other Topics Concern  . Not on file  Social History Narrative  . Not on file   Social History   Substance and Sexual Activity  Alcohol Use Yes   Social History   Tobacco Use  Smoking Status Never Smoker  Smokeless Tobacco Never Used   Social History   Substance and Sexual Activity  Drug Use Never    GYN: Sexual Health Menstrual status: regular menses LMP: No LMP recorded. (Menstrual status: Irregular Periods). Last pap smear: see HM section History of abnormal pap smears:    Health Maintenance: See under health Maintenance activity for review of completion dates as well.  There is no immunization history on file for this patient.    Depression Screen-PHQ2/9 Depression screen [facility] 2/9 06/25/2018 05/27/2018  Decreased Interest 0 2  Down, Depressed, Hopeless 0 1  PHQ - 2 Score 0 3  Altered sleeping - 2  Tired,  decreased energy - 2  Change in appetite - 1  Feeling bad or failure about yourself  - 1  Trouble concentrating - 0  Moving slowly or fidgety/restless - 0  Suicidal thoughts - 0  PHQ-9 Score - 9  Difficult doing work/chores - Somewhat difficult       Depression Severity and Treatment Recommendations:  0-4= None  5-9= Mild / Treatment: Support, educate to call if worse; return in one month  10-14= Moderate / Treatment: Support, watchful waiting; Antidepressant or Psycotherapy  15-19= Moderately severe / Treatment: Antidepressant OR Psychotherapy  >= 20 = Major depression, severe / Antidepressant AND Psychotherapy    Review of Systems   Review of Systems  Constitutional: Negative for chills and fever.  Respiratory: Negative for cough, sputum production and shortness of breath.   Cardiovascular: Negative for chest pain, palpitations and orthopnea.  Gastrointestinal: Negative for abdominal pain, nausea and vomiting.  Genitourinary: Negative for dysuria and urgency.  Neurological: Negative for dizziness, tingling and headaches.  Psychiatric/Behavioral: Negative for depression. The patient is not nervous/anxious.     See HPI for ROS as well.    Objective:   Vitals:   06/25/18 1640  BP: (!) 147/89  Pulse: 88  Resp: 17  Temp: 99 F (37.2 C)  TempSrc: Oral  SpO2: 98%  Weight: 256 lb 9.6 oz (116.4 kg)  Height: '5\' 7"'$  (1.702 m)   BP Readings from Last 3 Encounters:  06/25/18 (!) 147/89  06/16/18 (!) 148/101  05/27/18 (!) 162/115     Body mass index is 40.19 kg/m.  Physical Exam BP (!) 147/89 (BP Location: Left Arm, Patient Position: Sitting, Cuff Size: Large)   Pulse 88   Temp 99 F (37.2 C) (Oral)   Resp 17   Ht '5\' 7"'$  (1.702 m)   Wt 256 lb 9.6 oz (116.4 kg)   SpO2 98%   BMI 40.19 kg/m  Chaperone present General Appearance:    Alert, cooperative, no distress, appears stated age  Head:    Normocephalic, without obvious abnormality, atraumatic  Eyes:     conjunctiva/corneas clear, EOM's intact, fundi    benign, both eyes  Ears:    Normal TM's and external ear canals, both ears  Nose:   Nares normal, septum midline, mucosa normal, no drainage    or sinus tenderness  Throat:   Lips, mucosa, and tongue normal; teeth and gums normal  Neck:   Supple, symmetrical, trachea midline, no adenopathy;    thyroid:  no enlargement/tenderness/nodules  Back:     Symmetric,  no curvature, ROM normal, no CVA tenderness  Lungs:     Clear to auscultation bilaterally, respirations unlabored  Chest Wall:    No tenderness or deformity   Heart:    Regular rate and rhythm, S1 and S2 normal, no murmur, rub   or gallop  Breast Exam:    No tenderness, masses, or nipple abnormality  Abdomen:     Soft, non-tender, bowel sounds active all four quadrants,    no masses, no organomegaly  Genitalia:    Normal female without lesion, discharge or tenderness, pap smear performed, speculum exam normal, bimanual exam reveals midline uterus, ovaries without masses  Extremities:   Extremities normal, atraumatic, no cyanosis or edema  Pulses:   2+ and symmetric all extremities  Skin:   Skin color, texture, turgor normal, no rashes or lesions  Lymph nodes:   Cervical, supraclavicular, and axillary nodes normal  Neurologic:   CNII-XII intact, normal strength, sensation and reflexes    throughout       Assessment/Plan:   Patient was seen for a health maintenance exam.  Counseled the patient on health maintenance issues. Reviewed her health mainteance schedule and ordered appropriate tests (see orders.) Counseled on regular exercise and weight management. Recommend regular eye exams and dental cleaning.   The following issues were addressed today for health maintenance:   Reneta was seen today for annual exam.  Diagnoses and all orders for this visit:  Encounter for gynecological examination without abnormal finding -     Pap IG, CT/NG NAA, and HPV (high risk) [facility]  Encounter for health maintenance examination- Women's Health Maintenance Plan Advised monthly breast exam and annual mammogram Advised dental exam every six months Discussed stress management Discussed pap smear screening guidelines  Severe obesity (BMI >= 40) ([place])- discussed diet and exercise Reviewed labs for lipid and diabetes  HSIL (high grade squamous intraepithelial lesion) on Pap smear of cervix -     Ambulatory referral to Gynecology  Cervical high risk HPV (human papillomavirus) test positive -     Ambulatory referral to Gynecology  Will refer to [facility] for Colpos[facility]  No follow-ups on file.    Body mass index is 40.19 kg/m.:  Discussed the patient's BMI with patient. The BMI body mass index is 40.19 kg/m.     No future appointments.  Patient Instructions   We recommend that you schedule a mammogram for breast cancer screening. Typically, you do not need a referral to do this. Please contact a local imaging center to schedule your mammogram.  [facility] - [phone]  *ask for the Radiology [place] ([place]) - [phone] or [phone]  [facility] - [phone] [place] [phone] [facility] - [phone]  *ask for the [facility] - [phone]  *ask for the Radiology Department [facility] - [phone]  *ask for the [place] - [phone]     If you have lab work done today you will be contacted with your lab results within the next 2 weeks.  If you have not heard from [place] then please contact us. The fastest way to get your results is to register for My Chart.   IF you received an x-ray today, you will receive an invoice from [facility]. Please contact [facility] at [phone] with questions or concerns regarding your invoice.   IF you received  labwork today, you  will receive an invoice from [place]. Please contact [facility] at [phone] with questions or concerns regarding your invoice.   Our billing staff will not be able to assist you with questions regarding bills from these companies.  You will be contacted with the lab results as soon as they are available. The fastest way to get your results is to activate your My Chart account. Instructions are located on the last page of this paperwork. If you have not heard from [place] regarding the results in 2 weeks, please contact this office.    Health Maintenance, Female Adopting a healthy lifestyle and getting preventive care can go a long way to promote health and wellness. Talk with your health care provider about what schedule of regular examinations is right for you. This is a good chance for you to check in with your provider about disease prevention and staying healthy. In between checkups, there are plenty of things you can do on your own. Experts have done a lot of research about which lifestyle changes and preventive measures are most likely to keep you healthy. Ask your health care provider for more information. Weight and diet Eat a healthy diet  Be sure to include plenty of vegetables, fruits, low-fat dairy products, and lean protein.  Do not eat a lot of foods high in solid fats, added sugars, or salt.  Get regular exercise. This is one of the most important things you can do for your health. ? Most adults should exercise for at least 150 minutes each week. The exercise should increase your heart rate and make you sweat (moderate-intensity exercise). ? Most adults should also do strengthening exercises at least twice a week. This is in addition to the moderate-intensity exercise.  Maintain a healthy weight  Body mass index (BMI) is a measurement that can be used to identify possible weight problems. It estimates body fat based on height and weight. Your health care  provider can help determine your BMI and help you achieve or maintain a healthy weight.  For females 43 years of age and older: ? A BMI below 18.5 is considered underweight. ? A BMI of 18.5 to 24.9 is normal. ? A BMI of 25 to 29.9 is considered overweight. ? A BMI of 30 and above is considered obese.  Watch levels of cholesterol and blood lipids  You should start having your blood tested for lipids and cholesterol at 47 years of age, then have this test every 5 years.  You may need to have your cholesterol levels checked more often if: ? Your lipid or cholesterol levels are high. ? You are older than 47 years of age. ? You are at high risk for heart disease.  Cancer screening Lung Cancer  Lung cancer screening is recommended for adults 66-23 years old who are at high risk for lung cancer because of a history of smoking.  A yearly low-dose CT scan of the lungs is recommended for people who: ? Currently smoke. ? Have quit within the past 15 years. ? Have at least a 30-pack-year history of smoking. A pack year is smoking an average of one pack of cigarettes a day for 1 year.  Yearly screening should continue until it has been 15 years since you quit.  Yearly screening should stop if you develop a health problem that would prevent you from having lung cancer treatment.  Breast Cancer  Practice breast self-awareness. This means understanding how your breasts normally appear and feel.  It also means  doing regular breast self-exams. Let your health care provider know about any changes, no matter how small.  If you are in your 20s or 30s, you should have a clinical breast exam (CBE) by a health care provider every 1-3 years as part of a regular health exam.  If you are 31 or older, have a CBE every year. Also consider having a breast X-ray (mammogram) every year.  If you have a family history of breast cancer, talk to your health care provider about genetic screening.  If you are  at high risk for breast cancer, talk to your health care provider about having an MRI and a mammogram every year.  Breast cancer gene (BRCA) assessment is recommended for women who have family members with BRCA-related cancers. BRCA-related cancers include: ? Breast. ? Ovarian. ? Tubal. ? Peritoneal cancers.  Results of the assessment will determine the need for genetic counseling and BRCA1 and BRCA2 testing.  Cervical Cancer Your health care provider may recommend that you be screened regularly for cancer of the pelvic organs (ovaries, uterus, and vagina). This screening involves a pelvic examination, including checking for microscopic changes to the surface of your cervix (Pap test). You may be encouraged to have this screening done every 3 years, beginning at age 52.  For women ages 47-65, health care providers may recommend pelvic exams and Pap testing every 3 years, or they may recommend the Pap and pelvic exam, combined with testing for human papilloma virus (HPV), every 5 years. Some types of HPV increase your risk of cervical cancer. Testing for HPV may also be done on women of any age with unclear Pap test results.  Other health care providers may not recommend any screening for nonpregnant women who are considered low risk for pelvic cancer and who do not have symptoms. Ask your health care provider if a screening pelvic exam is right for you.  If you have had past treatment for cervical cancer or a condition that could lead to cancer, you need Pap tests and screening for cancer for at least 20 years after your treatment. If Pap tests have been discontinued, your risk factors (such as having a new sexual partner) need to be reassessed to determine if screening should resume. Some women have medical problems that increase the chance of getting cervical cancer. In these cases, your health care provider may recommend more frequent screening and Pap tests.  Colorectal Cancer  This type of  cancer can be detected and often prevented.  Routine colorectal cancer screening usually begins at 47 years of age and continues through 47 years of age.  Your health care provider may recommend screening at an earlier age if you have risk factors for colon cancer.  Your health care provider may also recommend using home test kits to check for hidden blood in the stool.  A small camera at the end of a tube can be used to examine your colon directly (sigmoidoscopy or colonoscopy). This is done to check for the earliest forms of colorectal cancer.  Routine screening usually begins at age 32.  Direct examination of the colon should be repeated every 5-10 years through 47 years of age. However, you may need to be screened more often if early forms of precancerous polyps or small growths are found.  Skin Cancer  Check your skin from head to toe regularly.  Tell your health care provider about any new moles or changes in moles, especially if there is a change in a  mole's shape or color.  Also tell your health care provider if you have a mole that is larger than the size of a pencil eraser.  Always use sunscreen. Apply sunscreen liberally and repeatedly throughout the day.  Protect yourself by wearing long sleeves, pants, a wide-brimmed hat, and sunglasses whenever you are outside.  Heart disease, diabetes, and high blood pressure  High blood pressure causes heart disease and increases the risk of stroke. High blood pressure is more likely to develop in: ? People who have blood pressure in the high end of the normal range (130-139/85-89 mm Hg). ? People who are overweight or obese. ? People who are African American.  If you are 73-15 years of age, have your blood pressure checked every 3-5 years. If you are 69 years of age or older, have your blood pressure checked every year. You should have your blood pressure measured twice-once when you are at a hospital or clinic, and once when you are  not at a hospital or clinic. Record the average of the two measurements. To check your blood pressure when you are not at a hospital or clinic, you can use: ? An automated blood pressure machine at a pharmacy. ? A home blood pressure monitor.  If you are between 58 years and 11 years old, ask your health care provider if you should take aspirin to prevent strokes.  Have regular diabetes screenings. This involves taking a blood sample to check your fasting blood sugar level. ? If you are at a normal weight and have a low risk for diabetes, have this test once every three years after 47 years of age. ? If you are overweight and have a high risk for diabetes, consider being tested at a younger age or more often. Preventing infection Hepatitis B  If you have a higher risk for hepatitis B, you should be screened for this virus. You are considered at high risk for hepatitis B if: ? You were born in a country where hepatitis B is common. Ask your health care provider which countries are considered high risk. ? Your parents were born in a high-risk country, and you have not been immunized against hepatitis B (hepatitis B vaccine). ? You have HIV or AIDS. ? You use needles to inject street drugs. ? You live with someone who has hepatitis B. ? You have had sex with someone who has hepatitis B. ? You get hemodialysis treatment. ? You take certain medicines for conditions, including cancer, organ transplantation, and autoimmune conditions.  Hepatitis C  Blood testing is recommended for: ? Everyone born from [birth year] through 1965. ? Anyone with known risk factors for hepatitis C.  Sexually transmitted infections (STIs)  You should be screened for sexually transmitted infections (STIs) including gonorrhea and chlamydia if: ? You are sexually active and are younger than 47 years of age. ? You are older than 47 years of age and your health care provider tells you that you are at risk for this type of  infection. ? Your sexual activity has changed since you were last screened and you are at an increased risk for chlamydia or gonorrhea. Ask your health care provider if you are at risk.  If you do not have HIV, but are at risk, it may be recommended that you take a prescription medicine daily to prevent HIV infection. This is called pre-exposure prophylaxis (PrEP). You are considered at risk if: ? You are sexually active and do not regularly use condoms or  know the HIV status of your partner(s). ? You take drugs by injection. ? You are sexually active with a partner who has HIV.  Talk with your health care provider about whether you are at high risk of being infected with HIV. If you choose to begin PrEP, you should first be tested for HIV. You should then be tested every 3 months for as long as you are taking PrEP. Pregnancy  If you are premenopausal and you may become pregnant, ask your health care provider about preconception counseling.  If you may become pregnant, take 400 to 800 micrograms (mcg) of folic acid every day.  If you want to prevent pregnancy, talk to your health care provider about birth control (contraception). Osteoporosis and menopause  Osteoporosis is a disease in which the bones lose minerals and strength with aging. This can result in serious bone fractures. Your risk for osteoporosis can be identified using a bone density scan.  If you are 75 years of age or older, or if you are at risk for osteoporosis and fractures, ask your health care provider if you should be screened.  Ask your health care provider whether you should take a calcium or vitamin D supplement to lower your risk for osteoporosis.  Menopause may have certain physical symptoms and risks.  Hormone replacement therapy may reduce some of these symptoms and risks. Talk to your health care provider about whether hormone replacement therapy is right for you. Follow these instructions at home:  Schedule  regular health, dental, and eye exams.  Stay current with your immunizations.  Do not use any tobacco products including cigarettes, chewing tobacco, or electronic cigarettes.  If you are pregnant, do not drink alcohol.  If you are breastfeeding, limit how much and how often you drink alcohol.  Limit alcohol intake to no more than 1 drink per day for nonpregnant women. One drink equals 12 ounces of beer, 5 ounces of wine, or 1 ounces of hard liquor.  Do not use street drugs.  Do not share needles.  Ask your health care provider for help if you need support or information about quitting drugs.  Tell your health care provider if you often feel depressed.  Tell your health care provider if you have ever been abused or do not feel safe at home. This information is not intended to replace advice given to you by your health care provider. Make sure you discuss any questions you have with your health care provider. Document Released: 04/16/2011 Document Revised: 03/08/2016 Document Reviewed: 07/05/2015 Elsevier Interactive Patient Education  [employer].

## 2018-07-02 LAB — PAP IG, CT-NG NAA, HPV HIGH-RISK
CHLAMYDIA, NUC. ACID AMP: NEGATIVE
GONOCOCCUS BY NUCLEIC ACID AMP: NEGATIVE
HPV, high-risk: POSITIVE — AB
PAP Smear Comment: 0

## 2018-07-10 ENCOUNTER — Telehealth: Payer: Self-pay | Admitting: Family Medicine

## 2018-07-10 NOTE — Telephone Encounter (Signed)
Spoke with pt.  Advised Dr. Nolon Rod did the diagnostic testing, that showed some changes and she is referred to the GYN to do the procedure that will tell us what those changes are.  Pt wanted to know if she has cancer.   Advised her we do not know if those changes are cancer or not, but the GYN will do the procedure that will tell us what those changes are.   Pt verbalized understanding.  Appt with GYN is 10/1

## 2018-07-10 NOTE — Telephone Encounter (Signed)
Copied from [place] [phone]. Topic: Inquiry >> Jul 10, 2018 10:08 AM Reyne Dumas L wrote: Reason for CRM:   Pt wants to know why she is being referred to an OB/GYN - she thinks that she was told she has cancer. Pt can be reached at [phone]

## 2018-07-15 ENCOUNTER — Encounter: Payer: Self-pay | Admitting: Gynecology

## 2018-07-15 ENCOUNTER — Ambulatory Visit: Payer: 59 | Admitting: Gynecology

## 2018-07-15 VITALS — BP 164/104 | Ht 68.0 in | Wt 256.0 lb

## 2018-07-15 DIAGNOSIS — R87810 Cervical high risk human papillomavirus (HPV) DNA test positive: Secondary | ICD-10-CM

## 2018-07-15 DIAGNOSIS — R87613 High grade squamous intraepithelial lesion on cytologic smear of cervix (HGSIL): Secondary | ICD-10-CM

## 2018-07-15 DIAGNOSIS — D069 Carcinoma in situ of cervix, unspecified: Secondary | ICD-10-CM | POA: Diagnosis not present

## 2018-07-15 NOTE — Addendum Note (Signed)
Addended by: Anastasio Auerbach on: 07/15/2018 01:22 PM   Modules accepted: Orders

## 2018-07-15 NOTE — Progress Notes (Signed)
    Kayla Carey 1971/02/06 [id number]        47 y.o.  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  Patient has recently moved from [place] and does not believe that she is ever had a Pap smear done before.  Having some mild irregular menses as far as timing but otherwise doing well from a gynecologic standpoint.  Had a normal GYN exam and is been referred for this abnormal Pap smear.  Past medical history,surgical history, problem list, medications, allergies, family history and social history were all reviewed and documented in the EPIC chart.  Directed ROS with pertinent positives and negatives documented in the history of present illness/assessment and plan.  Exam: Programmer, systems Vitals:   07/15/18 1220  BP: (!) 164/104  Weight: 256 lb (116.1 kg)  Height: 5\' 8"  (1.727 m)   General appearance:  Normal Abdomen soft nontender without masses guarding rebound Pelvic external BUS vagina normal.  Cervix normal.  Uterus normal size midline mobile nontender.  Adnexa without masses or tenderness.  Colposcopy performed after acetic acid cleanse is inadequate as transformation zone not clearly seen 360 degrees.  Areas of acetowhite change at 12:00 through 6:00 with atypical vessels noted at 3:00.  Four-quadrant biopsies taken to include the atypical vessel area.  ECC performed.  Monsel's applied afterwards.  Patient tolerated well.  Physical Exam  Genitourinary:      Assessment/Plan:  47 y.o. 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  I reviewed with the patient dysplasia, high-grade versus low-grade, progression versus regression and the HPV association.  The precancerous nature of a high-grade abnormality was reviewed and the need for treatment discussed.  Possibilities to include microinvasion also reviewed.  Patient will follow-up for biopsy results and then we will formulate a treatment plan.   Various possibilities reviewed to include LEEP, cone biopsy, hysterectomy.  We also discussed her hypertension at 164/104.  Patient relates not taking her blood pressure medicines this morning and I stressed the need to do so when she gets home and to monitor her blood pressure.  If it remains elevated she needs to see her primary provider ASAP.    Anastasio Auerbach MD, 1:13 PM 07/15/2018

## 2018-07-15 NOTE — Patient Instructions (Signed)
This will call you with biopsy results.  Take your blood pressure medication and monitor your blood pressure.  If it remains elevated follow-up with your primary physician.

## 2018-07-18 ENCOUNTER — Telehealth: Payer: Self-pay | Admitting: *Deleted

## 2018-07-18 ENCOUNTER — Telehealth: Payer: Self-pay | Admitting: Family Medicine

## 2018-07-18 NOTE — Telephone Encounter (Signed)
-----   Message from Kayla Auerbach, MD sent at 07/18/2018  8:35 AM EDT ----- Schedule gynecologic oncology appointment reference newly diagnosed cervical cancer.  I have already spoken to the patient and she is anticipating a phone call to arrange an appointment

## 2018-07-18 NOTE — Telephone Encounter (Signed)
Staff message sent to [facility] to schedule and let me know time and date.

## 2018-07-18 NOTE — Telephone Encounter (Signed)
Received medical records from Dixie Dials, MD 07/18/18

## 2018-07-18 NOTE — Telephone Encounter (Signed)
Patient scheduled on Dr. Gerarda Fraction on Wed, Oct 9 at 12:15pm, check in at 11:45am, requested copy of pathology report, I explained she will need to fill out a medical release form at office and she can have copy. Patient will be coming today to fill this out and pick up pathology report left at front desk.

## 2018-07-22 NOTE — Progress Notes (Signed)
[facility] Note: New Patient FIRST VISIT   Consult was requested by Dr. Donalynn Furlong for a cervical biopsy showing SCCa   Chief Complaint  Patient presents with  . Malignant neoplasm of cervix, unspecified site [facility])    GYN Oncologic Summary 1. TBD o .  HPI: [name]  is a very nice 47 y.o.  P3  She was seen for routine exam after living in [place] and receiving most of her care there. She had a screening Pap by her PCP. 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 She was referred to Gynecology for further workup.  07/15/18 Colposcopy with biopsies revealed: 1. 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 -NO INVASIVE NEOPLASM IDENTIFIED. -SEE COMMENT. 2. Cervix, biopsy, 3 o'clock -CERVICAL TRANSFORMATION ZONE MUCOSA WITH CIN-III (SEVERE SQUAMOUS DYSPLASIA/SQUAMOUS CARCINOMA IN SITU; HIGH GRADE SQUAMOUS INTRAEPITHELIAL LESION). -NO INVASIVE NEOPLASM IDENTIFIED. -SEE COMMENT. 3. Cervix, biopsy, 6 o'clock -FRAGMENTED SQUAMOUS MUCOSA WITH CIN-III (SEVERE SQUAMOUS DYSPLASIA/SQUAMOUS CARCINOMA IN SITU; HIGH GRADE SQUAMOUS INTRAEPITHELIAL LESION). -CANNOT RULE OUT HIGHER GRADE LESION. 4. Cervix, biopsy, 9 o'clock -FOCI OF SUBMUCOSAL INVASIVE SQUAMOUS CELL CARCINOMA IN A SETTING OF FRAGMENTED CERVICAL TRANSFORMATION ZONE MUCOSA WITH CIN-III (SEVERE SQUAMOUS DYSPLASIA/SQUAMOUS CARCINOMA IN SITU; HIGH GRADE SQUAMOUS INTRAEPITHELIAL LESION). -SEE COMMENT. The portion of the endocervical stroma containing invasive squamous cell carcinoma is heavily inflamed, unoriented and devoid of surface mucosa. In this setting, the depth of invasion cannot be accurately determined. 5. Endocervix, curettage - FRAGMENTED CERVICAL TRANSFORMATION ZONE MUCOSA WITH CIN-III (SEVERE SQUAMOUS DYSPLASIA/SQUAMOUS CARCINOMA IN  SITU; HIGH GRADE SQUAMOUS INTRAEPITHELIAL LESION). - CERVICAL STROMA NOT PRESENT FOR EVALUATION. - SEE COMMENT.  Patient denies any post-coital bleeding, but it is hard to get a history whether she has had intercourse in the last 6 months.  Her menses have been spacing out and she has noted no increased vaginal bleeding. She has noticed some spotting since the biopsies done a few days ago.  Occasional sharp right sided pain but it is fleeting. Denies N/V.  Imported EPIC Oncologic History:   No history exists.    Measurement of disease: pending . Marland Kitchen  Radiology: Dg Hip Unilat With Pelvis 2-3 Views Right  Result Date: 06/16/2018 CLINICAL DATA:  Twisted the right leg now with right hip pain EXAM: DG HIP (WITH OR WITHOUT PELVIS) 2-3V RIGHT COMPARISON:  None. FINDINGS: SI joints are non widened. Pubic symphysis and rami appear intact. No fracture or malalignment. IMPRESSION: No acute osseous abnormality. Electronically Signed   By: Donavan Foil M.D.   On: 06/16/2018 16:47   .   Outpatient Encounter Medications as of 07/23/2018  Medication Sig  . albuterol (PROVENTIL HFA;VENTOLIN HFA) 108 (90 Base) MCG/ACT inhaler Inhale into the lungs every 6 (six) hours as needed for wheezing or shortness of breath.  Marland Kitchen amLODipine (NORVASC) 5 MG tablet Take 5 mg by mouth daily.  . Ascorbic Acid (VITAMIN C) 1000 MG tablet Take 1,000 mg by mouth daily. Patient states she takes vitamin C 2000mg  daily  . aspirin 325 MG tablet Take 325 mg by mouth daily.  . calcium-vitamin D (OSCAL WITH D) 500-200 MG-UNIT tablet Take 1 tablet by mouth daily.  . cyclobenzaprine (FLEXERIL) 10 MG tablet 1/2 to 1 tablet every 8 hours prn muscle spasm and pain (Patient taking differently: Take 10 mg by mouth as needed. 1/2 to 1 tablet every 8 hours prn muscle spasm and  pain)  . ferrous sulfate 325 (65 FE) MG tablet Take 325 mg by mouth daily.  . hydrALAZINE (APRESOLINE) 25 MG tablet Take 25 mg by mouth 3 (three) times daily.  Marland Kitchen  losartan-hydrochlorothiazide (HYZAAR) 100-12.5 MG tablet Take 1 tablet by mouth daily.  . metoprolol tartrate (LOPRESSOR) 25 MG tablet Take 25 mg by mouth 2 (two) times daily.  . potassium chloride (K-DUR) 10 MEQ tablet Take 10 mEq by mouth daily.  . [DISCONTINUED] diclofenac sodium (VOLTAREN) 1 % GEL Apply 4 g topically 4 (four) times daily. (Patient not taking: Reported on 05/27/2018)  . [DISCONTINUED] naproxen (NAPROSYN) 500 MG tablet Take 1 tablet (500 mg total) by mouth 2 (two) times daily. (Patient not taking: Reported on 07/15/2018)   No facility-administered encounter medications on file as of 07/23/2018.    No Known Allergies  Past Medical History:  Diagnosis Date  . Enlarged heart 2014   diagnosed in [place]  . Hypertension    Past Surgical History:  Procedure Laterality Date  . BREAST SURGERY     Lumpectomy-benign        Past Gynecological History:   GYNECOLOGIC HISTORY:  . No LMP recorded. (Menstrual status: Irregular Periods).  . Menarche: 46 years old . P 3 . Contraceptive None . HRT NA  . Last Pap ?never before workup Family Hx:  Family History  Problem Relation Age of Onset  . Hypertension Mother   . Congestive Heart Failure Mother   . Hypertension Father   . Diabetes Father   . Cancer Father        Prostate  . Kidney cancer Son        son died from disease   Social Hx:  Marland Kitchen Tobacco use: quit ~2 weeks ago . Alcohol use: 1 drink per day . Illicit Drug use: none . Illicit IV Drug use: none    Review of Systems: Review of Systems  Constitutional: Positive for fatigue.  Respiratory: Positive for shortness of breath.   Cardiovascular: Positive for chest pain and leg swelling.  Gastrointestinal: Positive for constipation.  Endocrine: Positive for hot flashes.  Genitourinary: Positive for frequency, menstrual problem and vaginal bleeding.   Musculoskeletal: Positive for arthralgias.  Neurological: Positive for headaches.  All other systems reviewed and are  negative. clarification of chest pain sounds more like reflux when asked to describe   Vitals:  Vitals:   07/23/18 1206  BP: (!) 150/92  Pulse: 82  Resp: 16  Temp: 98.2 F (36.8 C)  SpO2: 100%   Vitals:   07/23/18 1206  Weight: 259 lb (117.5 kg)  Height: 5\' 6"  (1.676 m)   Body mass index is 41.8 kg/m.  Physical Exam: General :  Overweight. Well developed, 47 y.o., female in no apparent distress HEENT:  Normocephalic/atraumatic, symmetric, EOMI, eyelids normal Neck:   Supple, no masses.  Lymphatics:  No cervical/ submandibular/ supraclavicular/ infraclavicular/ inguinal adenopathy Respiratory:  Respirations unlabored, no use of accessory muscles CV:   Deferred Breast:  Deferred Musculoskeletal: No CVA tenderness, normal muscle strength. Abdomen:  Overweight.  Soft, non-tender and nondistended. No evidence of hernia. No masses. Extremities:  No lymphedema, no erythema, non-tender. Skin:   Normal inspection Neuro/Psych:  No focal motor deficit, no abnormal mental status. Normal gait. Normal affect. Alert and oriented to person, place, and time  Genito Urinary: Vulva: Normal external female genitalia.  Bladder/urethra: Urethral meatus normal in size and location. No lesions or   masses, well supported bladder Speculum exam: Vagina: No lesion, no discharge,  no bleeding. Cervix: No gross lesions, question something at internal os, but some abnormalities are due to recent biopsies. Somewhat ecchymotic again likely 2/2 recent biopsies. No fungating or ulcerative lesion Bimanual exam: No palpable lesion on bimanual  Uterus: Normal size, mobile.  Adnexa: No masses. Rectovaginal:  Good tone, no masses, no cul de sac nodularity, no parametrial involvement or nodularity.   Assessment  CIS versus SCCa Cervix ECOG PERFORMANCE STATUS: 0 - Asymptomatic  Plan  1. Complexity of visit ? This is a new problem and additional workup is planned ? Data reviewed ? There are no relevant  images to review ? If we end up diagnosing Stage IB then we'll likely order CT+/-PET ? I reviewed her referring doctor's office notes and I have summarized in the HPI ? History was obtained from the patient and her family along with her chart. ? This is an undiagnosed new problem with unknown prognosis. Minor surgery with risk factors will be recommended. 2. She may be early stage SCCa Cervix since I do not see a gross lesion ? Cone biopsy is indicated. 3. Pending further tissue it cannot be determined if we will recommend simple hysterectomy versus radical and I explained this to the family and the patient today. 4. They also understand we will have to wait a few weeks after the cone to do any definitive surgery. 5. We have not yet addressed the infectious nature of cervical cancer and indication for STD testing. This will need to happen at future visits.  Face to face time with patient was 60 minutes. Over 50% of this time was spent on counseling and coordination of care.  Isabel Caprice, MD  07/23/2018, 5:10 PM    Cc: Donalynn Furlong, MD (Referring Ob/Gyn) Forrest Moron, MD (PCP)

## 2018-07-23 ENCOUNTER — Inpatient Hospital Stay: Payer: 59 | Attending: Obstetrics | Admitting: Obstetrics

## 2018-07-23 ENCOUNTER — Encounter: Payer: Self-pay | Admitting: Obstetrics

## 2018-07-23 VITALS — BP 150/92 | HR 82 | Temp 98.2°F | Resp 16 | Ht 66.0 in | Wt 259.0 lb

## 2018-07-23 DIAGNOSIS — C539 Malignant neoplasm of cervix uteri, unspecified: Secondary | ICD-10-CM | POA: Insufficient documentation

## 2018-07-23 NOTE — Patient Instructions (Addendum)
Plan to have a cold knife conization of the cervix and endocervical curettage at the [facility] on July 30, 2018.  You will receive a phone call from the pre-surgical RN to discuss instructions.  Please call for any questions or concerns.   Cervical Conization  Cervical conization (cone biopsy) is a procedure in which a cone-shaped portion of the cervix is cut out so that it can be examined under a microscope. The procedure is done to check for cancer cells or cells that might turn into cancer (precancerous cells). You may have this procedure if:  You have abnormal bleeding from your cervix.  You had an abnormal Pap test.  Something abnormal was seen on your cervix during an exam.  This procedure is performed in either a health care provider's office or in an operating room. Tell a health care provider about:  Any allergies you have.  All medicines you are taking, including vitamins, herbs, eye drops, creams, and over-the-counter medicines.  Any problems you or family members have had with the use of anesthetic medicines.  Any blood disorders you have.  Any surgeries you have had.  Any medical conditions you have.  Your smoking habits.  When you normally have your period.  Whether you are pregnant or may be pregnant. What are the risks? Generally, this is a safe procedure. However, problems may occur, including:  Heavy bleeding for several days or weeks after the procedure.  Allergic reactions to medicines or dyes.  Increased risk of preterm labor in future pregnancies.  Infection (rare).  Damage to the cervix or other structures or organs (rare).  What happens before the procedure? Staying hydrated Follow instructions from your health care provider about hydration, which may include:  Up to 2 hours before the procedure - you may continue to drink clear liquids, such as water, clear fruit juice, black coffee, and plain tea.  Eating and  drinking restrictions Follow instructions from your health care provider about eating and drinking.  General instructions  Do not douche, have sex, use tampons, or use any vaginal medicines before the procedure as told by your health care provider.  You may be asked to empty your bladder and bowel right before the procedure.  Ask your health care provider about: ? Changing or stopping your normal medicines. This is important if you take diabetes medicines or blood thinners. ? Taking medicines such as aspirin and ibuprofen. These medicines can thin your blood. Do not take these medicines before your procedure if your doctor tells you not to.  Plan to have someone take you home from the hospital or clinic. What happens during the procedure?  To reduce your risk of infection: ? Your health care team will wash or sanitize their hands. ? Your skin will be washed with soap. ? Hair may be removed from the surgical area.  You will undress from the waist down and be given a gown to wear.  You will lie on an examining table and put your feet in stirrups.  An IV tube will be inserted into one of your veins.  You will be given one or more of the following: ? A medicine to help you relax (sedative). ? A medicine to numb the area (local anesthetic). ? A medicine to make you fall asleep (general anesthetic). ? A medicine that numbs the cervix (cervical block).  A lubricated device called a speculum will be inserted into your vagina. It will be used to spread open  the walls of the vagina so your health care provider can see the inside of the vagina and cervix better.  An instrument that has a magnifying lens and a light (colposcope) will let your health care provider examine the cervix more closely.  Your health care provider will apply a solution to your cervix. This turns abnormal areas a pale color.  A tissue sample will be removed from the cervix using one of the following methods: ? The  cold knife method. In this method, the tissue is cut out with a knife (scalpel). ? The loop electrosurgical excision procedure (LEEP) method. In this method, the tissue is cut out with a thin wire that can burn (cauterize) the tissue with an electrical current. ? Laser treatment method. In this method, the tissue is cut out and then cauterized with a laser beam to prevent bleeding.  Your health care provider will apply a paste over the biopsy areas to help control bleeding.  The tissue sample will be examined under a microscope. The procedure may vary among health care providers and hospitals. What happens after the procedure?  Your blood pressure, heart rate, breathing rate, and blood oxygen level will be monitored often until the medicines you were given have worn off.  If you were given a local anesthetic, you will rest at the clinic or hospital until you are stable and feel ready to go home.  If you were given a general anesthetic, you may be monitored for a longer period of time.  You may have some cramping.  You may have bloody discharge or light to moderate bleeding.  You may have dark discharge coming from your vagina. This is from the paste used on the cervix to prevent bleeding. Summary  Cervical conization is a procedure in which a cone-shaped portion of the cervix is cut out so that it can be examined under a microscope.  The procedure is done to check for cancer cells or cells that might turn into cancer (precancerous cells). This information is not intended to replace advice given to you by your health care provider. Make sure you discuss any questions you have with your health care provider. Document Released: 07/11/2005 Document Revised: 10/03/2016 Document Reviewed: 10/03/2016 Elsevier Interactive Patient Education  2017 [employer].

## 2018-07-28 ENCOUNTER — Other Ambulatory Visit: Payer: Self-pay

## 2018-07-28 ENCOUNTER — Encounter (HOSPITAL_BASED_OUTPATIENT_CLINIC_OR_DEPARTMENT_OTHER): Payer: Self-pay | Admitting: *Deleted

## 2018-07-28 NOTE — Progress Notes (Addendum)
Spoke w/ pt via phone for pre-op interview.  Npo after mn.  Arrive at [facility]  Needs istat 8 and urine preg.  Per pre-op order EKG requested , there is a current ekg in epic dated 05-27-2018 which was abnormal possible ishcemia.  Per pt she saw cardiologist , dr Doylene Canard, and he did a stress test and was told it was ok.  Requested lov note and stress test result from dr [id number] office.  Then will review with anthesia.  Pt will take norvasc, lopressor, and hydralazine am dos w/ sips of water.   ADDENDUM:  Received lov note and ekg from dr Donnella Bi office , pt did not have a stress test done .  Reviewed pt chart w/ dr rose mda, face to face, including pt cardiologist note from dr Donnella Bi.  Per dr rose mda, stated pt would need cardiology clearance from another cardiologist other than dr Doylene Canard before having her procedure.  lvm for dr Gerarda Fraction via phone at office.

## 2018-07-29 ENCOUNTER — Other Ambulatory Visit: Payer: Self-pay | Admitting: Gynecologic Oncology

## 2018-07-29 ENCOUNTER — Telehealth: Payer: Self-pay

## 2018-07-29 ENCOUNTER — Telehealth: Payer: Self-pay | Admitting: Gynecologic Oncology

## 2018-07-29 DIAGNOSIS — R9431 Abnormal electrocardiogram [ECG] [EKG]: Secondary | ICD-10-CM

## 2018-07-29 NOTE — Telephone Encounter (Signed)
Kayla Carey is scheduled for the cardiologist Dr. Bettina Gavia on 08-01-18 to be evaluated for cardiac clearance for surgery 08-08-18. Gave Kayla Streicher the address for Dr. Bettina Gavia to google for directions. Pt is fine with 08-08-18 date for r/s of surgery.

## 2018-07-29 NOTE — Telephone Encounter (Signed)
Contacted patient and informed her that anesthesia is requiring cardiology clearance prior to proceeding with her procedure due to abnormal EKG in August showing ischemia.  Patient advised she would be contacted with information about her appointment.  No other concerns voiced. Advised her procedure would have to be rescheduled.

## 2018-08-01 ENCOUNTER — Encounter: Payer: Self-pay | Admitting: Cardiology

## 2018-08-01 ENCOUNTER — Ambulatory Visit (INDEPENDENT_AMBULATORY_CARE_PROVIDER_SITE_OTHER): Payer: 59 | Admitting: Cardiology

## 2018-08-01 ENCOUNTER — Encounter: Payer: Self-pay | Admitting: *Deleted

## 2018-08-01 VITALS — BP 156/90 | HR 84 | Ht 66.0 in | Wt 258.0 lb

## 2018-08-01 DIAGNOSIS — R011 Cardiac murmur, unspecified: Secondary | ICD-10-CM | POA: Diagnosis not present

## 2018-08-01 DIAGNOSIS — R9431 Abnormal electrocardiogram [ECG] [EKG]: Secondary | ICD-10-CM

## 2018-08-01 DIAGNOSIS — I10 Essential (primary) hypertension: Secondary | ICD-10-CM

## 2018-08-01 DIAGNOSIS — I119 Hypertensive heart disease without heart failure: Secondary | ICD-10-CM

## 2018-08-01 DIAGNOSIS — K219 Gastro-esophageal reflux disease without esophagitis: Secondary | ICD-10-CM | POA: Diagnosis not present

## 2018-08-01 DIAGNOSIS — R079 Chest pain, unspecified: Secondary | ICD-10-CM | POA: Diagnosis not present

## 2018-08-01 DIAGNOSIS — Z01810 Encounter for preprocedural cardiovascular examination: Secondary | ICD-10-CM | POA: Diagnosis not present

## 2018-08-01 DIAGNOSIS — D509 Iron deficiency anemia, unspecified: Secondary | ICD-10-CM | POA: Insufficient documentation

## 2018-08-01 HISTORY — DX: Hypertensive heart disease without heart failure: I11.9

## 2018-08-01 HISTORY — DX: Chest pain, unspecified: R07.9

## 2018-08-01 MED ORDER — FUROSEMIDE 40 MG PO TABS: 40.0000 mg | ORAL_TABLET | Freq: Two times a day (BID) | ORAL | 1 refills | Status: DC

## 2018-08-01 NOTE — Patient Instructions (Signed)
Medication Instructions:  Your physician has recommended you make the following change in your medication:   INCREASE: furosemide (Lasix) 40mg  one tablet two times per day  If you need a refill on your cardiac medications before your next appointment, please call your pharmacy.   Lab work: You will have lab work today: CBC, ProBNP, BMP If you have labs (blood work) drawn today and your tests are completely normal, you will receive your results only by: Marland Kitchen MyChart Message (if you have MyChart) OR . A paper copy in the mail If you have any lab test that is abnormal or we need to change your treatment, we will call you to review the results.  Testing/Procedures: You had an EKG today  Your physician has requested that you have an echocardiogram. Echocardiography is a painless test that uses sound waves to create images of your heart. It provides your doctor with information about the size and shape of your heart and how well your heart's chambers and valves are working. This procedure takes approximately one hour. There are no restrictions for this procedure.  Your physician has requested that you have a lexiscan myoview. For further information please visit [email or web address]. Please follow instruction sheet, as given.      Follow-Up: At [facility], you and your health needs are our priority.  As part of our continuing mission to provide you with exceptional heart care, we have created designated Provider Care Teams.  These Care Teams include your primary Cardiologist (physician) and Advanced Practice Providers (APPs -  Physician Assistants and Nurse Practitioners) who all work together to provide you with the care you need, when you need it. . You will need a follow up appointment in 4 weeks.    Any Other Special Instructions Will Be Listed Below (If Applicable).  Echocardiogram An echocardiogram, or echocardiography, uses sound waves (ultrasound) to produce an image of your heart.  The echocardiogram is simple, painless, obtained within a short period of time, and offers valuable information to your health care provider. The images from an echocardiogram can provide information such as:  Evidence of coronary artery disease (CAD).  Heart size.  Heart muscle function.  Heart valve function.  Aneurysm detection.  Evidence of a past heart attack.  Fluid buildup around the heart.  Heart muscle thickening.  Assess heart valve function.  Tell a health care provider about:  Any allergies you have.  All medicines you are taking, including vitamins, herbs, eye drops, creams, and over-the-counter medicines.  Any problems you or family members have had with anesthetic medicines.  Any blood disorders you have.  Any surgeries you have had.  Any medical conditions you have.  Whether you are pregnant or may be pregnant. What happens before the procedure? No special preparation is needed. Eat and drink normally. What happens during the procedure?  In order to produce an image of your heart, gel will be applied to your chest and a wand-like tool (transducer) will be moved over your chest. The gel will help transmit the sound waves from the transducer. The sound waves will harmlessly bounce off your heart to allow the heart images to be captured in real-time motion. These images will then be recorded.  You may need an IV to receive a medicine that improves the quality of the pictures. What happens after the procedure? You may return to your normal schedule including diet, activities, and medicines, unless your health care provider tells you otherwise. This information is not intended to replace  advice given to you by your health care provider. Make sure you discuss any questions you have with your health care provider. Document Released: 09/28/2000 Document Revised: 05/19/2016 Document Reviewed: 06/08/2013 Elsevier Interactive Patient Education  2017 [employer].   Cardiac Nuclear Scan A cardiac nuclear scan is a test that measures blood flow to the heart when a person is resting and when he or she is exercising. The test looks for problems such as:  Not enough blood reaching a portion of the heart.  The heart muscle not working normally.  You may need this test if:  You have heart disease.  You have had abnormal lab results.  You have had heart surgery or angioplasty.  You have chest pain.  You have shortness of breath.  In this test, a radioactive dye (tracer) is injected into your bloodstream. After the tracer has traveled to your heart, an imaging device is used to measure how much of the tracer is absorbed by or distributed to various areas of your heart. This procedure is usually done at a hospital and takes 2-4 hours. Tell a health care provider about:  Any allergies you have.  All medicines you are taking, including vitamins, herbs, eye drops, creams, and over-the-counter medicines.  Any problems you or family members have had with the use of anesthetic medicines.  Any blood disorders you have.  Any surgeries you have had.  Any medical conditions you have.  Whether you are pregnant or may be pregnant. What are the risks? Generally, this is a safe procedure. However, problems may occur, including:  Serious chest pain and heart attack. This is only a risk if the stress portion of the test is done.  Rapid heartbeat.  Sensation of warmth in your chest. This usually passes quickly.  What happens before the procedure?  Ask your health care provider about changing or stopping your regular medicines. This is especially important if you are taking diabetes medicines or blood thinners.  Remove your jewelry on the day of the procedure. What happens during the procedure?  An IV tube will be inserted into one of your veins.  Your health care provider will inject a small amount of radioactive tracer through the  tube.  You will wait for 20-40 minutes while the tracer travels through your bloodstream.  Your heart activity will be monitored with an electrocardiogram (ECG).  You will lie down on an exam table.  Images of your heart will be taken for about 15-20 minutes.  You may be asked to exercise on a treadmill or stationary bike. While you exercise, your heart's activity will be monitored with an ECG, and your blood pressure will be checked. If you are unable to exercise, you may be given a medicine to increase blood flow to parts of your heart.  When blood flow to your heart has peaked, a tracer will again be injected through the IV tube.  After 20-40 minutes, you will get back on the exam table and have more images taken of your heart.  When the procedure is over, your IV tube will be removed. The procedure may vary among health care providers and hospitals. Depending on the type of tracer used, scans may need to be repeated 3-4 hours later. What happens after the procedure?  Unless your health care provider tells you otherwise, you may return to your normal schedule, including diet, activities, and medicines.  Unless your health care provider tells you otherwise, you may increase your fluid intake.  This will help flush the contrast dye from your body. Drink enough fluid to keep your urine clear or pale yellow.  It is up to you to get your test results. Ask your health care provider, or the department that is doing the test, when your results will be ready. Summary  A cardiac nuclear scan measures the blood flow to the heart when a person is resting and when he or she is exercising.  You may need this test if you are at risk for heart disease.  Tell your health care provider if you are pregnant.  Unless your health care provider tells you otherwise, increase your fluid intake. This will help flush the contrast dye from your body. Drink enough fluid to keep your urine clear or pale  yellow. This information is not intended to replace advice given to you by your health care provider. Make sure you discuss any questions you have with your health care provider. Document Released: 10/26/2004 Document Revised: 10/03/2016 Document Reviewed: 09/09/2013 Elsevier Interactive Patient Education  2017 [employer].

## 2018-08-01 NOTE — Progress Notes (Signed)
Cardiology Office Note:    Date:  08/01/2018   ID:  Kayla Carey, DOB 12-Jan-1971, MRN [id number]  PCP:  Forrest Moron, MD  Cardiologist:  Shirlee More, MD   Referring MD: Forrest Moron, MD  ASSESSMENT:    1. Preoperative cardiovascular examination   2. Essential hypertension   3. Gastroesophageal reflux disease, esophagitis presence not specified   4. Chest pain, unspecified type   5. Iron deficiency anemia, unspecified iron deficiency anemia type   6. Abnormal electrocardiogram (ECG) (EKG)   7. Chest pain in adult   8. Systolic ejection murmur    PLAN:    In order of problems listed above:  1. Plan procedure is low risk however patient is high risk with unexplained chest pain abnormal EKG systolic ejection murmur and concerns for heart failure.  We should defer the elective procedure to cardiac evaluation including myocardial perfusion study echocardiogram cardiac obtain records from her previous cardiologist increase her diuretic and check lab work including proBNP for heart failure renal function on diuretic and a CBC to exclude anemia.  I will see her back in the office in several weeks and make a decision regarding whether she is optimized. 2. Poorly controlled on multiple drugs including loop diuretic ARB beta-blocker calcium channel blocker and will double the dose of her furosemide. 3. Both typical and atypical with abnormal EKG needs further evaluation and check a CBC to exclude anemia as a precipitant 4. Check CBC I cannot find a hemoglobin in her electronic medical record 5. Likely due to long-standing severe hypertension however need to exclude ischemia echocardiogram is appropriate 6. Echocardiogram to exclude aortic stenosis and obstructive hypertrophic cardiomyopathy  Next appointment   Medication Adjustments/Labs and Tests Ordered: Current medicines are reviewed at length with the patient today.  Concerns regarding medicines are outlined above.  Orders  Placed This Encounter  Procedures  . EKG 12-Lead   No orders of the defined types were placed in this encounter.    Chief Complaint  Patient presents with  . Pre-op Exam    abnormal EKG  . Hypertension    History of Present Illness:    Kayla Carey is a 47 y.o. female who is being seen today for preoperative cardiology evaluation with an abnormal EKG at the request of Forrest Moron, MD.  Surgeon Role  Isabel Caprice, MD Primary   Procedure Laterality Anesthesia  CONIZATION CERVIX WITH BIOPSY AND ENDOCERVICAL CURETTAGE N/A Choice     She has a history of long-standing hypertension in the range of 15 years and tells me the numbers are always elevators and never at target.  She been seen by a cardiologist in [place] outside of see [facility] and from her description had some type of stress test performed in the last few years.  She has long-standing severe hypertension she is short of breath with activity has peripheral edema and is at risk for congestive heart failure.  She also has an ejection murmur and may have aortic stenosis or obstructive hypertrophic cardiomyopathy.  Her EKG is markedly abnormal which could be stone seen on long-standing hypertension with LVH severe or with CAD.  She is having chest pain most of the sounds atypical sharp localized productivity also tells me she tries to do activities like running she gets a tightness in her chest relieved with rest.  Although the planned procedure is low risk with abnormal EKG suspicion of decompensated heart failure heart murmur and chest pain I think she needs undergo  further evaluation.  Echocardiogram is ordered regarding valvular aortic stenosis hypertrophic cardiomyopathy and myocardial perfusion study regarding ischemia.  I asked her to double her diuretic continue her multiple antihypertensive agents and will defer decisions on surgery until seen by me back in the office in 4 weeks.  Laboratory studies performed in August  shows a cholesterol 184 A1c is normal creatinine normal 0.59 potassium normal 4.2.  Past Medical History:  Diagnosis Date  . Cervical cancer ([place])   . Chest pain    07-28-2018  per pt has seen a cardiologist, dr Donnella Bi, in past 3-4 months ago and had a stress test , was told it was ok   . Enlarged heart 2014   diagnosed in [place]  . GERD (gastroesophageal reflux disease)    per pt just drinks water  . Hypertension   . IDA (iron deficiency anemia)     Past Surgical History:  Procedure Laterality Date  . BREAST SURGERY Left age 58   Lumpectomy-benign    Current Medications: Current Meds  Medication Sig  . albuterol (PROVENTIL HFA;VENTOLIN HFA) 108 (90 Base) MCG/ACT inhaler Inhale into the lungs every 6 (six) hours as needed for wheezing or shortness of breath.  Marland Kitchen amLODipine (NORVASC) 5 MG tablet Take 5 mg by mouth every morning.   . Ascorbic Acid (VITAMIN C) 1000 MG tablet Take 1,000 mg by mouth daily.   . ferrous sulfate 325 (65 FE) MG tablet Take 325 mg by mouth daily with breakfast.   . furosemide (LASIX) 40 MG tablet Take 1 tablet by mouth daily.  . hydrALAZINE (APRESOLINE) 25 MG tablet Take 25 mg by mouth 3 (three) times daily.   Marland Kitchen losartan-hydrochlorothiazide (HYZAAR) 100-12.5 MG tablet Take 1 tablet by mouth every morning.   . metoprolol tartrate (LOPRESSOR) 25 MG tablet Take 25 mg by mouth every morning.   . potassium chloride (K-DUR) 10 MEQ tablet Take 10 mEq by mouth every morning.      Allergies:   Patient has no known allergies.   Social History   Socioeconomic History  . Marital status: Single    Spouse name: Not on file  . Number of children: Not on file  . Years of education: Not on file  . Highest education level: Not on file  Occupational History  . Not on file  Social Needs  . Financial resource strain: Not on file  . Food insecurity:    Worry: Not on file    Inability: Not on file  . Transportation needs:    Medical: Not on file    Non-medical:  Not on file  Tobacco Use  . Smoking status: Former Smoker    Packs/day: 0.50    Years: 20.00    Pack years: 10.00    Types: Cigarettes    Last attempt to quit: 07/15/2018    Years since quitting: 0.0  . Smokeless tobacco: Never Used  . Tobacco comment: 07-28-2018 tring to quit, per pt quit approx. 07-15-2018  Substance and Sexual Activity  . Alcohol use: Yes    Comment: occasional  . Drug use: Never  . Sexual activity: Not Currently    Comment: 1st intercourse 47 yo-More than 5 partners  Lifestyle  . Physical activity:    Days per week: Not on file    Minutes per session: Not on file  . Stress: Not on file  Relationships  . Social connections:    Talks on phone: Not on file    Gets together: Not on file  Attends religious service: Not on file    Active member of club or organization: Not on file    Attends meetings of clubs or organizations: Not on file    Relationship status: Not on file  Other Topics Concern  . Not on file  Social History Narrative  . Not on file     Family History: The patient's family history includes Cancer in her father; Congestive Heart Failure in her mother; Diabetes in her father; Hypertension in her father and mother; Kidney cancer in her son.  ROS:   Review of Systems  Constitution: Negative.  HENT: Negative.   Eyes: Negative.   Cardiovascular: Positive for chest pain, dyspnea on exertion and leg swelling.  Respiratory: Positive for shortness of breath.   Endocrine: Negative.   Hematologic/Lymphatic: Negative.   Skin: Negative.   Musculoskeletal: Positive for back pain.  Gastrointestinal: Negative.   Genitourinary: Negative.   Neurological: Negative.   Psychiatric/Behavioral: The patient is nervous/anxious.   Allergic/Immunologic: Negative.    Please see the history of present illness.     All other systems reviewed and are negative.  EKGs/Labs/Other Studies Reviewed:    The following studies were reviewed today:   EKG:  EKG  is  ordered today.  The ekg ordered today demonstrates normal sinus rhythm biatrial LVH and marked repolarization changes  Recent Labs: 05/27/2018: ALT 16; BUN 12; Creatinine, Ser 0.59; Potassium 4.2; Sodium 140  Recent Lipid Panel    Component Value Date/Time   CHOL 184 05/27/2018 1335   TRIG 54 05/27/2018 1335   HDL 48 05/27/2018 1335   CHOLHDL 3.8 05/27/2018 1335   LDLCALC 125 (H) 05/27/2018 1335    Physical Exam:    VS:  BP (!) 156/90 (BP Location: Right Arm, Patient Position: Sitting, Cuff Size: Normal)   Pulse 84   Ht 5\' 6"  (1.676 m)   Wt 258 lb (117 kg)   SpO2 98%   BMI 41.64 kg/m     Wt Readings from Last 3 Encounters:  08/01/18 258 lb (117 kg)  07/23/18 259 lb (117.5 kg)  07/15/18 256 lb (116.1 kg)     GEN: She appears fatigued has mild pallor well nourished, well developed in no acute distress HEENT: Normal NECK: No JVD; No carotid bruits LYMPHATICS: No lymphadenopathy CARDIAC: Grade 2/6 to 3/6 systolic ejection murmur harsh aortic area into the right clavicle along the left sternal border no aortic regurgitation S2 is split RRR, no murmurs, rubs, gallops RESPIRATORY:  Clear to auscultation without rales, wheezing or rhonchi  ABDOMEN: Soft, non-tender, non-distended MUSCULOSKELETAL: 3+ to the knee bilateral edema; No deformity  SKIN: Warm and dry NEUROLOGIC:  Alert and oriented x 3 PSYCHIATRIC:  Normal affect     Signed, Shirlee More, MD  08/01/2018 4:12 PM    [facility]

## 2018-08-02 LAB — CBC
Hematocrit: 42 % (ref 34.0–46.6)
Hemoglobin: 14.6 g/dL (ref 11.1–15.9)
MCH: 31.3 pg (ref 26.6–33.0)
MCHC: 34.8 g/dL (ref 31.5–35.7)
MCV: 90 fL (ref 79–97)
PLATELETS: 233 10*3/uL (ref 150–450)
RBC: 4.67 x10E6/uL (ref 3.77–5.28)
RDW: 13.2 % (ref 12.3–15.4)
WBC: 4.8 10*3/uL (ref 3.4–10.8)

## 2018-08-02 LAB — BASIC METABOLIC PANEL
BUN/Creatinine Ratio: 25 — ABNORMAL HIGH (ref 9–23)
BUN: 16 mg/dL (ref 6–24)
CALCIUM: 10.3 mg/dL — AB (ref 8.7–10.2)
CHLORIDE: 100 mmol/L (ref 96–106)
CO2: 28 mmol/L (ref 20–29)
Creatinine, Ser: 0.65 mg/dL (ref 0.57–1.00)
GFR calc non Af Amer: 106 mL/min/{1.73_m2} (ref >59)
GFR, EST AFRICAN AMERICAN: 122 mL/min/{1.73_m2} (ref >59)
Glucose: 81 mg/dL (ref 65–99)
Potassium: 4.3 mmol/L (ref 3.5–5.2)
Sodium: 142 mmol/L (ref 134–144)

## 2018-08-02 LAB — PRO B NATRIURETIC PEPTIDE: NT-PRO BNP: 236 pg/mL (ref 0–249)

## 2018-08-04 ENCOUNTER — Telehealth (HOSPITAL_COMMUNITY): Payer: Self-pay | Admitting: *Deleted

## 2018-08-04 ENCOUNTER — Telehealth: Payer: Self-pay | Admitting: *Deleted

## 2018-08-04 ENCOUNTER — Telehealth: Payer: Self-pay | Admitting: Oncology

## 2018-08-04 NOTE — Telephone Encounter (Signed)
Patient given detailed instructions per Myocardial Perfusion Study Information Sheet for the test on 08/05/18 at 1000. Patient notified to arrive 15 minutes early and that it is imperative to arrive on time for appointment to keep from having the test rescheduled.  If you need to cancel or reschedule your appointment, please call the office within 24 hours of your appointment. . Patient verbalized understanding.Minervia Osso, Ranae Palms

## 2018-08-04 NOTE — Telephone Encounter (Signed)
Lennice Sites, RN with Dr. Joya Gaskins office to see if stress test can be rescheduled for this week.

## 2018-08-04 NOTE — Telephone Encounter (Signed)
Patient's 2-day lexiscan has been moved up due to needing this testing for cardiac clearance for upcoming biopsy procedure scheduled fro Friday, 08/08/18. Patient informed by Edmon Crape at our front desk. Patient agreeable, no further questions.

## 2018-08-05 ENCOUNTER — Ambulatory Visit (HOSPITAL_BASED_OUTPATIENT_CLINIC_OR_DEPARTMENT_OTHER): Payer: 59

## 2018-08-05 ENCOUNTER — Ambulatory Visit (HOSPITAL_COMMUNITY): Payer: 59 | Attending: Cardiology

## 2018-08-05 VITALS — Ht 66.0 in | Wt 258.0 lb

## 2018-08-05 DIAGNOSIS — Z01810 Encounter for preprocedural cardiovascular examination: Secondary | ICD-10-CM | POA: Insufficient documentation

## 2018-08-05 DIAGNOSIS — R011 Cardiac murmur, unspecified: Secondary | ICD-10-CM

## 2018-08-05 DIAGNOSIS — R9431 Abnormal electrocardiogram [ECG] [EKG]: Secondary | ICD-10-CM | POA: Diagnosis present

## 2018-08-05 DIAGNOSIS — R079 Chest pain, unspecified: Secondary | ICD-10-CM | POA: Diagnosis present

## 2018-08-05 LAB — ECHOCARDIOGRAM COMPLETE
HEIGHTINCHES: 66 in
WEIGHTICAEL: 4128 [oz_av]

## 2018-08-05 MED ORDER — TECHNETIUM TC 99M TETROFOSMIN IV KIT
31.1000 | PACK | Freq: Once | INTRAVENOUS | Status: AC | PRN
  Administered 2018-08-05: 31.1 via INTRAVENOUS
  Filled 2018-08-05: qty 32

## 2018-08-05 MED ORDER — REGADENOSON 0.4 MG/5ML IV SOLN
0.4000 mg | Freq: Once | INTRAVENOUS | Status: AC
  Administered 2018-08-05: 0.4 mg via INTRAVENOUS

## 2018-08-05 NOTE — Progress Notes (Unsigned)
08/05/18 Pregnancy test given- negative.  Kayla Carey

## 2018-08-06 ENCOUNTER — Ambulatory Visit: Payer: 59 | Admitting: Obstetrics

## 2018-08-06 ENCOUNTER — Ambulatory Visit (HOSPITAL_COMMUNITY): Payer: 59 | Attending: Cardiology

## 2018-08-06 ENCOUNTER — Telehealth: Payer: Self-pay | Admitting: *Deleted

## 2018-08-06 LAB — MYOCARDIAL PERFUSION IMAGING
CHL CUP NUCLEAR SDS: 1
CHL CUP NUCLEAR SRS: 0
CSEPPHR: 114 {beats}/min
LV sys vol: 60 mL
LVDIAVOL: 118 mL (ref 46–106)
Rest HR: 68 {beats}/min
SSS: 1
TID: 1.3

## 2018-08-06 MED ORDER — TECHNETIUM TC 99M TETROFOSMIN IV KIT
31.3000 | PACK | Freq: Once | INTRAVENOUS | Status: AC | PRN
  Administered 2018-08-06: 31.3 via INTRAVENOUS
  Filled 2018-08-06: qty 32

## 2018-08-06 NOTE — Telephone Encounter (Signed)
Left message to return call regarding echocardiogram results.

## 2018-08-07 ENCOUNTER — Ambulatory Visit (HOSPITAL_BASED_OUTPATIENT_CLINIC_OR_DEPARTMENT_OTHER): Payer: 59

## 2018-08-07 ENCOUNTER — Telehealth: Payer: Self-pay | Admitting: Oncology

## 2018-08-07 ENCOUNTER — Telehealth: Payer: Self-pay | Admitting: Gynecologic Oncology

## 2018-08-07 NOTE — Progress Notes (Signed)
Spoke with Lenna Sciara NP with [facility] and she plans to cancel surgery for tomorrow. May reschedule after further cardiac workup.

## 2018-08-07 NOTE — Telephone Encounter (Signed)
Kayla Carey called and asked if her surgery tomorrow will be canceled.  Advised her that she will need further testing per Dr. Bettina Gavia so it has been canceled.  Advised her that we will call her back when it can be rescheduled.

## 2018-08-08 ENCOUNTER — Ambulatory Visit (HOSPITAL_BASED_OUTPATIENT_CLINIC_OR_DEPARTMENT_OTHER): Admission: RE | Admit: 2018-08-08 | Payer: 59 | Source: Ambulatory Visit | Admitting: Obstetrics

## 2018-08-08 HISTORY — DX: Gastro-esophageal reflux disease without esophagitis: K21.9

## 2018-08-08 HISTORY — DX: Iron deficiency anemia, unspecified: D50.9

## 2018-08-08 HISTORY — DX: Malignant neoplasm of cervix uteri, unspecified: C53.9

## 2018-08-08 SURGERY — CONE BIOPSY, CERVIX
Anesthesia: Choice

## 2018-08-08 NOTE — Telephone Encounter (Signed)
Patient called asking if her procedure was still scheduled for tomorrow am.  Discussed with her that her cardiologist is needing to perform another procedure.  Advised her that this would be discussed in more detail on Monday at her appt with Dr. Bettina Gavia.  Advised that we are going to continue to follow her progress and will reschedule her procedure as soon as possible if clearance is obtained.  Pt stating that she understands.  No other concerns voiced. Advised to call for any needs.

## 2018-08-11 ENCOUNTER — Ambulatory Visit (INDEPENDENT_AMBULATORY_CARE_PROVIDER_SITE_OTHER): Payer: 59 | Admitting: Cardiology

## 2018-08-11 ENCOUNTER — Ambulatory Visit (HOSPITAL_BASED_OUTPATIENT_CLINIC_OR_DEPARTMENT_OTHER)
Admission: RE | Admit: 2018-08-11 | Discharge: 2018-08-11 | Disposition: A | Discharge status: Home / Self Care | Payer: 59 | Source: Ambulatory Visit | Attending: Cardiology | Admitting: Cardiology

## 2018-08-11 VITALS — BP 134/94 | HR 66 | Ht 66.0 in | Wt 256.8 lb

## 2018-08-11 DIAGNOSIS — I5032 Chronic diastolic (congestive) heart failure: Secondary | ICD-10-CM

## 2018-08-11 DIAGNOSIS — R9439 Abnormal result of other cardiovascular function study: Secondary | ICD-10-CM

## 2018-08-11 DIAGNOSIS — R9431 Abnormal electrocardiogram [ECG] [EKG]: Secondary | ICD-10-CM

## 2018-08-11 DIAGNOSIS — I517 Cardiomegaly: Secondary | ICD-10-CM | POA: Diagnosis not present

## 2018-08-11 DIAGNOSIS — Z01818 Encounter for other preprocedural examination: Secondary | ICD-10-CM | POA: Diagnosis present

## 2018-08-11 DIAGNOSIS — I110 Hypertensive heart disease with heart failure: Secondary | ICD-10-CM

## 2018-08-11 HISTORY — DX: Abnormal result of other cardiovascular function study: R94.39

## 2018-08-11 HISTORY — DX: Abnormal electrocardiogram (ECG) (EKG): R94.31

## 2018-08-11 NOTE — Patient Instructions (Addendum)
Medication Instructions:  Your physician recommends that you continue on your current medications as directed. Please refer to the Current Medication list given to you today.  If you need a refill on your cardiac medications before your next appointment, please call your pharmacy.   Lab work: Your physician recommends that you return for lab work today: BMP.   If you have labs (blood work) drawn today and your tests are completely normal, you will receive your results only by: Marland Kitchen MyChart Message (if you have MyChart) OR . A paper copy in the mail If you have any lab test that is abnormal or we need to change your treatment, we will call you to review the results.  Testing/Procedures: A chest x-ray takes a picture of the organs and structures inside the chest, including the heart, lungs, and blood vessels. This test can show several things, including, whether the heart is enlarges; whether fluid is building up in the lungs; and whether pacemaker / defibrillator leads are still in place.      [facility] [place], [place] [address] Dept: [phone] Loc: [place]  08/11/2018  You are scheduled for a Cardiac Catheterization on Wednesday, October 30 with Dr. Shelva Majestic.  1. Please arrive [facility] at [facility] [address] [place], [place] [address] at 9:30 AM (This time is two hours before your procedure to ensure your preparation). Free valet parking service is available.   Special note: Every effort is made to have your procedure done on time. Please understand that emergencies sometimes delay scheduled procedures.  2. Diet: Do not eat solid foods after midnight.  The patient may have clear liquids until 5am upon the day of the procedure.  3. Labs: None needed.   4. Medication instructions in preparation for your  procedure:   Contrast Allergy: No   Please hold your losartan-hydrochlorothiazide and furosemide (lasix) starting Tuesday, 08/10/18.   On the morning of your procedure, you can take any morning medicines NOT listed above.  You may use sips of water.  5. Plan for one night stay--bring personal belongings. 6. Bring a current list of your medications and current insurance cards. 7. You MUST have a responsible person to drive you home. 8. Someone MUST be with you the first 24 hours after you arrive home or your discharge will be delayed. 9. Please wear clothes that are easy to get on and off and wear slip-on shoes.  Thank you for allowing [place] to care for you!   -- [facility]   Follow-Up: At [facility], you and your health needs are our priority.  As part of our continuing mission to provide you with exceptional heart care, we have created designated Provider Care Teams.  These Care Teams include your primary Cardiologist (physician) and Advanced Practice Providers (APPs -  Physician Assistants and Nurse Practitioners) who all work together to provide you with the care you need, when you need it. You will need a follow up appointment in 4 weeks.     Coronary Angiogram With Stent Coronary angiogram with stent placement is a procedure to widen or open a narrow blood vessel of the heart (coronary artery). Arteries may become blocked by cholesterol buildup (plaques) in the lining of the wall. When a coronary artery becomes partially blocked, blood flow to that area decreases. This may lead  to chest pain or a heart attack (myocardial infarction). A stent is a small piece of metal that looks like mesh or a spring. Stent placement may be done as treatment for a heart attack or right after a coronary angiogram in which a blocked artery is found. Let your health care provider know about:  Any allergies you have.  All medicines you are taking, including vitamins,  herbs, eye drops, creams, and over-the-counter medicines.  Any problems you or family members have had with anesthetic medicines.  Any blood disorders you have.  Any surgeries you have had.  Any medical conditions you have.  Whether you are pregnant or may be pregnant. What are the risks? Generally, this is a safe procedure. However, problems may occur, including:  Damage to the heart or its blood vessels.  A return of blockage.  Bleeding, infection, or bruising at the insertion site.  A collection of blood under the skin (hematoma) at the insertion site.  A blood clot in another part of the body.  Kidney injury.  Allergic reaction to the dye or contrast that is used.  Bleeding into the abdomen (retroperitoneal bleeding).  What happens before the procedure? Staying hydrated Follow instructions from your health care provider about hydration, which may include:  Up to 2 hours before the procedure - you may continue to drink clear liquids, such as water, clear fruit juice, black coffee, and plain tea.  Eating and drinking restrictions Follow instructions from your health care provider about eating and drinking, which may include:  8 hours before the procedure - stop eating heavy meals or foods such as meat, fried foods, or fatty foods.  6 hours before the procedure - stop eating light meals or foods, such as toast or cereal.  2 hours before the procedure - stop drinking clear liquids.  Ask your health care provider about:  Changing or stopping your regular medicines. This is especially important if you are taking diabetes medicines or blood thinners.  Taking medicines such as ibuprofen. These medicines can thin your blood. Do not take these medicines before your procedure if your health care provider instructs you not to. Generally, aspirin is recommended before a procedure of passing a small, thin tube (catheter) through a blood vessel and into the heart (cardiac  catheterization).  What happens during the procedure?  An IV tube will be inserted into one of your veins.  You will be given one or more of the following: ? A medicine to help you relax (sedative). ? A medicine to numb the area where the catheter will be inserted into an artery (local anesthetic).  To reduce your risk of infection: ? Your health care team will wash or sanitize their hands. ? Your skin will be washed with soap. ? Hair may be removed from the area where the catheter will be inserted.  Using a guide wire, the catheter will be inserted into an artery. The location may be in your groin, in your wrist, or in the fold of your arm (near your elbow).  A type of X-ray (fluoroscopy) will be used to help guide the catheter to the opening of the arteries in the heart.  A dye will be injected into the catheter, and X-rays will be taken. The dye will help to show where any narrowing or blockages are located in the arteries.  A tiny wire will be guided to the blocked spot, and a balloon will be inflated to make the artery wider.  The stent  will be expanded and will crush the plaques into the wall of the vessel. The stent will hold the area open and improve the blood flow. Most stents have a drug coating to reduce the risk of the stent narrowing over time.  The artery may be made wider using a drill, laser, or other tools to remove plaques.  When the blood flow is better, the catheter will be removed. The lining of the artery will grow over the stent, which stays where it was placed. This procedure may vary among health care providers and hospitals. What happens after the procedure?  If the procedure is done through the leg, you will be kept in bed lying flat for about 6 hours. You will be instructed to not bend and not cross your legs.  The insertion site will be checked frequently.  The pulse in your foot or wrist will be checked frequently.  You may have additional blood  tests, X-rays, and a test that records the electrical activity of your heart (electrocardiogram, or ECG). This information is not intended to replace advice given to you by your health care provider. Make sure you discuss any questions you have with your health care provider. Document Released: 04/07/2003 Document Revised: 05/31/2016 Document Reviewed: 05/06/2016 Elsevier Interactive Patient Education  [employer].

## 2018-08-11 NOTE — Progress Notes (Signed)
Cardiology Office Note:    Date:  08/12/2018   ID:  Kayla Carey, DOB 1971-07-15, MRN [id number]  PCP:  Forrest Moron, MD  Cardiologist:  Shirlee More, MD    Referring MD: Forrest Moron, MD    ASSESSMENT:    1. Abnormal myocardial perfusion study   2. Abnormal electrocardiogram (ECG) (EKG)   3. 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 Pre-op evaluation    PLAN:    In order of problems listed above:  1. For coronary angiography I suspect that all of her problem here is long-standing severe hypertension complicated with hypertensive heart disease and diastolic heart failure that is well compensated on her current diuretic.  If normal work referral for GYN oncologist for surgery 2. Abnormal other due to ischemia or LVH 3. Improved, BP close to target target and clinical findings of heart failure resolved 4.          coronary artery angiography labs performed today   Next appointment: 4 weeks   Medication Adjustments/Labs and Tests Ordered: Current medicines are reviewed at length with the patient today.  Concerns regarding medicines are outlined above.  Orders Placed This Encounter  Procedures  . DG Chest 2 View  . Basic Metabolic Panel (BMET)   No orders of the defined types were placed in this encounter.   Chief Complaint  Patient presents with  . Follow-up    abnormal MPI and EKG  . Pre-op Exam  . Hypertension  . Congestive Heart Failure    History of Present Illness:    Kayla Carey is a 47 y.o. female with a hx of hypertension and an abnormal EKG last seen 08/01/18.  She was seen in preoperative evaluation for further evaluation underwent echocardiogram which showed severe left ventricular hypertrophy and right and left atrial enlargement.  A gated Myoview study was performed pharmacologically which was high risk with transient ventricular dilatation intermediate risk.  Initial interpretation raises the  question of multivessel CAD and advises coronary angiography or cardiac CTA for further evaluation. Compliance with diet, lifestyle and medications: Yes  She is seen in the office along with her sister [facility] long-term care.  She is improved in retrospect I think she had an element of diastolic heart failure edema is clear blood pressure is improved she has had no further chest discomfort.  Her evaluation revealed findings of severe secondary left ventricular hypertrophy when she also had an abnormal myocardial perfusion study with transient ventricular dilation.  Although the response was nonischemic this can be a marker of multivessel severe CAD and with her abnormal EKG and the need for surgical procedures which her sister tells me will include a hysterectomy she needs undergo further evaluation.  I had communication from the GYN oncology group time is of the essence and reviewed the option of either delayed cardiac CTA electively referral for coronary angiography options benefits and risk detailed informed consent made and should be scheduled for coronary angiography at the first opportunity to complete her preoperative ablation.  At present she is having no edema orthopnea shortness of breath chest pain or palpitation.  Her sister is involved in her care is compliant with her multiple drug regimen for resistant hypertension.  She has no dye allergy. Past Medical History:  Diagnosis Date  . Cervical cancer ([place])   . Chest pain    07-28-2018  per pt has seen a cardiologist, dr Donnella Bi, in past 3-4 months ago  and had a stress test , was told it was ok   . Enlarged heart 2014   diagnosed in [place]  . GERD (gastroesophageal reflux disease)    per pt just drinks water  . Hypertension   . IDA (iron deficiency anemia)     Past Surgical History:  Procedure Laterality Date  . BREAST SURGERY Left age 38   Lumpectomy-benign    Current Medications: Current Meds  Medication Sig    . albuterol (PROVENTIL HFA;VENTOLIN HFA) 108 (90 Base) MCG/ACT inhaler Inhale into the lungs every 6 (six) hours as needed for wheezing or shortness of breath.  Marland Kitchen amLODipine (NORVASC) 5 MG tablet Take 5 mg by mouth daily.   . Ascorbic Acid (VITAMIN C) 1000 MG tablet Take 1,000 mg by mouth daily.   . Cholecalciferol (VITAMIN D3) 2000 units capsule Take 2,000 Units by mouth daily.  . ferrous sulfate 325 (65 FE) MG tablet Take 325 mg by mouth daily.   . furosemide (LASIX) 40 MG tablet Take 1 tablet (40 mg total) by mouth 2 (two) times daily.  . hydrALAZINE (APRESOLINE) 25 MG tablet Take 25 mg by mouth 3 (three) times daily.   Marland Kitchen losartan-hydrochlorothiazide (HYZAAR) 100-12.5 MG tablet Take 1 tablet by mouth every morning.   . metoprolol tartrate (LOPRESSOR) 25 MG tablet Take 25 mg by mouth every morning.   . potassium chloride (K-DUR) 10 MEQ tablet Take 10 mEq by mouth every morning.      Allergies:   Patient has no known allergies.   Social History   Socioeconomic History  . Marital status: Single    Spouse name: Not on file  . Number of children: Not on file  . Years of education: Not on file  . Highest education level: Not on file  Occupational History  . Not on file  Social Needs  . Financial resource strain: Not on file  . Food insecurity:    Worry: Not on file    Inability: Not on file  . Transportation needs:    Medical: Not on file    Non-medical: Not on file  Tobacco Use  . Smoking status: Former Smoker    Packs/day: 0.50    Years: 20.00    Pack years: 10.00    Types: Cigarettes    Last attempt to quit: 07/15/2018    Years since quitting: 0.0  . Smokeless tobacco: Never Used  . Tobacco comment: 07-28-2018 tring to quit, per pt quit approx. 07-15-2018  Substance and Sexual Activity  . Alcohol use: Yes    Comment: occasional  . Drug use: Never  . Sexual activity: Not Currently    Comment: 1st intercourse 46 yo-More than 5 partners  Lifestyle  . Physical activity:     Days per week: Not on file    Minutes per session: Not on file  . Stress: Not on file  Relationships  . Social connections:    Talks on phone: Not on file    Gets together: Not on file    Attends religious service: Not on file    Active member of club or organization: Not on file    Attends meetings of clubs or organizations: Not on file    Relationship status: Not on file  Other Topics Concern  . Not on file  Social History Narrative  . Not on file     Family History: The patient's family history includes Cancer in her father; Congestive Heart Failure in her mother; Diabetes in her father;  Hypertension in her father and mother; Kidney cancer in her son. ROS:   Please see the history of present illness.    All other systems reviewed and are negative.  EKGs/Labs/Other Studies Reviewed:    The following studies were reviewed today:  I reviewed the results of echocardiogram severe left ventricular hypertrophy and myocardial perfusion study with transient ventricular dilatation with the patient and her sister and we discussed in detail.  The visit was more than 25 minutes and greater than 50% was spent in counseling and planning subsequent care  Recent Labs: 05/27/2018: ALT 16 08/01/2018: Hemoglobin 14.6; NT-Pro BNP 236; Platelets 233 08/11/2018: BUN 13; Creatinine, Ser 0.65; Potassium 3.9; Sodium 142  Recent Lipid Panel    Component Value Date/Time   CHOL 184 05/27/2018 1335   TRIG 54 05/27/2018 1335   HDL 48 05/27/2018 1335   CHOLHDL 3.8 05/27/2018 1335   LDLCALC 125 (H) 05/27/2018 1335    Physical Exam:    VS:  BP (!) 134/94 (BP Location: Right Arm, Patient Position: Sitting, Cuff Size: Large)   Pulse 66   Ht 5\' 6"  (1.676 m)   Wt 256 lb 12.8 oz (116.5 kg)   SpO2 98%   BMI 41.45 kg/m     Wt Readings from Last 3 Encounters:  08/11/18 256 lb 12.8 oz (116.5 kg)  08/05/18 258 lb (117 kg)  08/01/18 258 lb (117 kg)     GEN:  Well nourished, well developed in no  acute distress HEENT: Normal NECK: No JVD; No carotid bruits LYMPHATICS: No lymphadenopathy CARDIAC: RRR, no murmurs, rubs, gallops RESPIRATORY:  Clear to auscultation without rales, wheezing or rhonchi  ABDOMEN: Soft, non-tender, non-distended MUSCULOSKELETAL:  No edema; No deformity  SKIN: Warm and dry NEUROLOGIC:  Alert and oriented x 3 PSYCHIATRIC:  Normal affect    Signed, Shirlee More, MD  08/12/2018 7:47 AM    [facility]

## 2018-08-12 ENCOUNTER — Telehealth: Payer: Self-pay | Admitting: *Deleted

## 2018-08-12 ENCOUNTER — Ambulatory Visit: Payer: 59 | Admitting: Cardiology

## 2018-08-12 LAB — BASIC METABOLIC PANEL
BUN / CREAT RATIO: 20 (ref 9–23)
BUN: 13 mg/dL (ref 6–24)
CO2: 29 mmol/L (ref 20–29)
Calcium: 9.9 mg/dL (ref 8.7–10.2)
Chloride: 99 mmol/L (ref 96–106)
Creatinine, Ser: 0.65 mg/dL (ref 0.57–1.00)
GFR calc Af Amer: 122 mL/min/{1.73_m2} (ref >59)
GFR, EST NON AFRICAN AMERICAN: 106 mL/min/{1.73_m2} (ref >59)
Glucose: 96 mg/dL (ref 65–99)
POTASSIUM: 3.9 mmol/L (ref 3.5–5.2)
SODIUM: 142 mmol/L (ref 134–144)

## 2018-08-12 NOTE — Telephone Encounter (Signed)
Pt contacted pre-catheterization scheduled at [facility] for: Wednesday August 13, 2018 11:30 AM Verified arrival time and place: [place] Entrance A at: 9 AM  No solid food after midnight prior to cath, clear liquids until 5 AM day of procedure. Contrast allergy: no  Hold: Losartan-HCTZ-day before and day of procedure per Dr Bettina Gavia Furosemide-day before and day of procedure  per Dr Shea Evans before and day of procedure  Except hold medications AM meds can be  taken pre-cath with sip of water including: ASA 81 mg  Confirmed patient has responsible person to drive home post procedure and for 24 hours after you arrive home: yes

## 2018-08-13 ENCOUNTER — Ambulatory Visit (HOSPITAL_COMMUNITY)
Admission: RE | Admit: 2018-08-13 | Discharge: 2018-08-13 | Disposition: A | Discharge status: Home / Self Care | Payer: 59 | Source: Ambulatory Visit | Attending: Cardiovascular Disease | Admitting: Cardiovascular Disease

## 2018-08-13 ENCOUNTER — Other Ambulatory Visit: Payer: Self-pay

## 2018-08-13 ENCOUNTER — Encounter (HOSPITAL_COMMUNITY)
Admission: RE | Disposition: A | Discharge status: Home / Self Care | Payer: Self-pay | Source: Ambulatory Visit | Attending: Cardiovascular Disease

## 2018-08-13 DIAGNOSIS — Z8541 Personal history of malignant neoplasm of cervix uteri: Secondary | ICD-10-CM | POA: Insufficient documentation

## 2018-08-13 DIAGNOSIS — I5032 Chronic diastolic (congestive) heart failure: Secondary | ICD-10-CM | POA: Insufficient documentation

## 2018-08-13 DIAGNOSIS — Z8052 Family history of malignant neoplasm of bladder: Secondary | ICD-10-CM | POA: Diagnosis not present

## 2018-08-13 DIAGNOSIS — R079 Chest pain, unspecified: Secondary | ICD-10-CM | POA: Diagnosis not present

## 2018-08-13 DIAGNOSIS — Z809 Family history of malignant neoplasm, unspecified: Secondary | ICD-10-CM | POA: Insufficient documentation

## 2018-08-13 DIAGNOSIS — R9439 Abnormal result of other cardiovascular function study: Secondary | ICD-10-CM | POA: Diagnosis present

## 2018-08-13 DIAGNOSIS — I110 Hypertensive heart disease with heart failure: Secondary | ICD-10-CM | POA: Insufficient documentation

## 2018-08-13 DIAGNOSIS — K219 Gastro-esophageal reflux disease without esophagitis: Secondary | ICD-10-CM | POA: Diagnosis not present

## 2018-08-13 DIAGNOSIS — R9431 Abnormal electrocardiogram [ECG] [EKG]: Secondary | ICD-10-CM

## 2018-08-13 DIAGNOSIS — Z8249 Family history of ischemic heart disease and other diseases of the circulatory system: Secondary | ICD-10-CM | POA: Insufficient documentation

## 2018-08-13 DIAGNOSIS — Z87891 Personal history of nicotine dependence: Secondary | ICD-10-CM | POA: Diagnosis not present

## 2018-08-13 DIAGNOSIS — Z833 Family history of diabetes mellitus: Secondary | ICD-10-CM | POA: Diagnosis not present

## 2018-08-13 DIAGNOSIS — D509 Iron deficiency anemia, unspecified: Secondary | ICD-10-CM | POA: Diagnosis not present

## 2018-08-13 HISTORY — PX: LEFT HEART CATH AND CORONARY ANGIOGRAPHY: CATH118249

## 2018-08-13 LAB — PREGNANCY, URINE: PREG TEST UR: NEGATIVE

## 2018-08-13 SURGERY — LEFT HEART CATH AND CORONARY ANGIOGRAPHY
Anesthesia: LOCAL

## 2018-08-13 MED ORDER — DIAZEPAM 5 MG PO TABS: 5.0000 mg | ORAL_TABLET | Freq: Four times a day (QID) | ORAL | Status: DC | PRN

## 2018-08-13 MED ORDER — LIDOCAINE HCL (PF) 1 % IJ SOLN
INTRAMUSCULAR | Status: DC | PRN
  Administered 2018-08-13: 2 mL

## 2018-08-13 MED ORDER — SODIUM CHLORIDE 0.9 % IV SOLN: 250.0000 mL | INTRAVENOUS | Status: DC | PRN

## 2018-08-13 MED ORDER — FENTANYL CITRATE (PF) 100 MCG/2ML IJ SOLN
INTRAMUSCULAR | Status: DC | PRN
  Administered 2018-08-13: 50 ug via INTRAVENOUS

## 2018-08-13 MED ORDER — SODIUM CHLORIDE 0.9 % WEIGHT BASED INFUSION: 1.0000 mL/kg/h | INTRAVENOUS | Status: DC

## 2018-08-13 MED ORDER — SODIUM CHLORIDE 0.9% FLUSH: 3.0000 mL | Freq: Two times a day (BID) | INTRAVENOUS | Status: DC

## 2018-08-13 MED ORDER — SODIUM CHLORIDE 0.9 % IV SOLN: INTRAVENOUS | Status: DC

## 2018-08-13 MED ORDER — VERAPAMIL HCL 2.5 MG/ML IV SOLN
INTRAVENOUS | Status: DC | PRN
  Administered 2018-08-13: 10 mL via INTRA_ARTERIAL

## 2018-08-13 MED ORDER — SODIUM CHLORIDE 0.9% FLUSH: 3.0000 mL | INTRAVENOUS | Status: DC | PRN

## 2018-08-13 MED ORDER — VERAPAMIL HCL 2.5 MG/ML IV SOLN
INTRAVENOUS | Status: AC
  Filled 2018-08-13: qty 2

## 2018-08-13 MED ORDER — SODIUM CHLORIDE 0.9 % WEIGHT BASED INFUSION
3.0000 mL/kg/h | INTRAVENOUS | Status: AC
  Administered 2018-08-13: 3 mL/kg/h via INTRAVENOUS

## 2018-08-13 MED ORDER — HYDRALAZINE HCL 20 MG/ML IJ SOLN
INTRAMUSCULAR | Status: DC | PRN
  Administered 2018-08-13: 10 mg via INTRAVENOUS

## 2018-08-13 MED ORDER — FENTANYL CITRATE (PF) 100 MCG/2ML IJ SOLN
INTRAMUSCULAR | Status: AC
  Filled 2018-08-13: qty 2

## 2018-08-13 MED ORDER — HEPARIN (PORCINE) IN NACL 1000-0.9 UT/500ML-% IV SOLN
INTRAVENOUS | Status: DC | PRN
  Administered 2018-08-13 (×2): 500 mL

## 2018-08-13 MED ORDER — HEPARIN SODIUM (PORCINE) 1000 UNIT/ML IJ SOLN
INTRAMUSCULAR | Status: DC | PRN
  Administered 2018-08-13: 6000 [IU] via INTRAVENOUS

## 2018-08-13 MED ORDER — ONDANSETRON HCL 4 MG/2ML IJ SOLN: 4.0000 mg | Freq: Four times a day (QID) | INTRAMUSCULAR | Status: DC | PRN

## 2018-08-13 MED ORDER — MIDAZOLAM HCL 2 MG/2ML IJ SOLN
INTRAMUSCULAR | Status: AC
  Filled 2018-08-13: qty 2

## 2018-08-13 MED ORDER — ASPIRIN 81 MG PO CHEW: 81.0000 mg | CHEWABLE_TABLET | ORAL | Status: DC

## 2018-08-13 MED ORDER — LIDOCAINE HCL (PF) 1 % IJ SOLN
INTRAMUSCULAR | Status: AC
  Filled 2018-08-13: qty 30

## 2018-08-13 MED ORDER — MIDAZOLAM HCL 2 MG/2ML IJ SOLN
INTRAMUSCULAR | Status: DC | PRN
  Administered 2018-08-13: 2 mg via INTRAVENOUS

## 2018-08-13 MED ORDER — HEPARIN (PORCINE) IN NACL 1000-0.9 UT/500ML-% IV SOLN
INTRAVENOUS | Status: AC
  Filled 2018-08-13: qty 1000

## 2018-08-13 MED ORDER — ACETAMINOPHEN 325 MG PO TABS: 650.0000 mg | ORAL_TABLET | ORAL | Status: DC | PRN

## 2018-08-13 MED ORDER — IOHEXOL 350 MG/ML SOLN
INTRAVENOUS | Status: DC | PRN
  Administered 2018-08-13: 60 mL via INTRA_ARTERIAL

## 2018-08-13 MED ORDER — HYDRALAZINE HCL 20 MG/ML IJ SOLN
INTRAMUSCULAR | Status: AC
  Filled 2018-08-13: qty 1

## 2018-08-13 SURGICAL SUPPLY — 9 items
CATH OPTITORQUE TIG 4.0 5F (CATHETERS) ×2 IMPLANT
DEVICE RAD COMP TR BAND LRG (VASCULAR PRODUCTS) ×2 IMPLANT
GLIDESHEATH SLEND SS 6F .021 (SHEATH) ×2 IMPLANT
GUIDEWIRE INQWIRE 1.5J.035X260 (WIRE) ×1 IMPLANT
INQWIRE 1.5J .035X260CM (WIRE) ×2
KIT HEART LEFT (KITS) ×2 IMPLANT
PACK CARDIAC CATHETERIZATION (CUSTOM PROCEDURE TRAY) ×2 IMPLANT
TRANSDUCER W/STOPCOCK (MISCELLANEOUS) ×2 IMPLANT
TUBING CIL FLEX 10 FLL-RA (TUBING) ×2 IMPLANT

## 2018-08-13 NOTE — Discharge Instructions (Signed)
Return to Work ________________[name]___________________________________ was treated at our facility. 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  Employee may return to modified work on ______________________. Work activity restrictions Work activities that are not tolerated include: ___Bending ___Prolonged sitting ___Lifting more than ________ lb ___Squatting ___ Prolonged standing ___Climbing ___Reaching ___Pushing and pulling ___ Walking ___Other ______________________ These restrictions are effective until ______________________. Show this Return to Work statement to your supervisor at work as soon as possible. Your employer should be aware of your condition and can help with the necessary work activity restrictions. If you wish to return to work sooner than the date that is listed above, or if you have further problems that make it difficult for you to return at that time, please call our clinic or your health care provider. _________________________________________ Health Care Provider Name (printed) _________________________________________ Health Care Provider (signature) _________________________________________ Date This information is not intended to replace advice given to you by your health care provider. Make sure you discuss any questions you have with your health care provider. Document Released: 10/01/2005 Document Revised: 09/14/2016 Document Reviewed: 04/30/2014 Elsevier Interactive Patient Education  2018 [place] Refer to this sheet in the next few weeks. These instructions provide you with information about caring for yourself after your procedure. Your health care provider may also give you more specific instructions. Your treatment has been planned according to current medical practices, but problems  sometimes occur. Call your health care provider if you have any problems or questions after your procedure. What can I expect after the procedure? After your procedure, it is typical to have the following:  Bruising at the radial site that usually fades within 1-2 weeks.  Blood collecting in the tissue (hematoma) that may be painful to the touch. It should usually decrease in size and tenderness within 1-2 weeks.  Follow these instructions at home:  Take medicines only as directed by your health care provider.  You may shower 24-48 hours after the procedure or as directed by your health care provider. Remove the bandage (dressing) and gently wash the site with plain soap and water. Pat the area dry with a clean towel. Do not rub the site, because this may cause bleeding.  Do not take baths, swim, or use a hot tub until your health care provider approves.  Check your insertion site every day for redness, swelling, or drainage.  Do not apply powder or lotion to the site.  Do not flex or bend the affected arm for 24 hours or as directed by your health care provider.  Do not push or pull heavy objects with the affected arm for 24 hours or as directed by your health care provider.  Do not lift over 10 lb (4.5 kg) for 5 days after your procedure or as directed by your health care provider.  Ask your health care provider when it is okay to: ? Return to work or school. ? Resume usual physical activities or sports. ? Resume sexual activity.  Do not drive home if you are discharged the same day as the procedure. Have someone else drive you.  You may drive 24 hours after the procedure unless otherwise instructed by your health care provider.  Do not operate machinery or power tools for 24 hours after the procedure.  If your procedure was done as an outpatient procedure, which means that you went home the same  day as your procedure, a responsible adult should be with you for the first 24  hours after you arrive home.  Keep all follow-up visits as directed by your health care provider. This is important. Contact a health care provider if:  You have a fever.  You have chills.  You have increased bleeding from the radial site. Hold pressure on the site. Get help right away if:  You have unusual pain at the radial site.  You have redness, warmth, or swelling at the radial site.  You have drainage (other than a small amount of blood on the dressing) from the radial site.  The radial site is bleeding, and the bleeding does not stop after 30 minutes of holding steady pressure on the site.  Your arm or hand becomes pale, cool, tingly, or numb. This information is not intended to replace advice given to you by your health care provider. Make sure you discuss any questions you have with your health care provider. Document Released: 11/03/2010 Document Revised: 03/08/2016 Document Reviewed: 04/19/2014 Elsevier Interactive Patient Education  2018 [place].    Moderate Conscious Sedation, Adult, Care After These instructions provide you with information about caring for yourself after your procedure. Your health care provider may also give you more specific instructions. Your treatment has been planned according to current medical practices, but problems sometimes occur. Call your health care provider if you have any problems or questions after your procedure. What can I expect after the procedure? After your procedure, it is common:  To feel sleepy for several hours.  To feel clumsy and have poor balance for several hours.  To have poor judgment for several hours.  To vomit if you eat too soon.  Follow these instructions at home: For at least 24 hours after the procedure:   Do not: ? Participate in activities where you could fall or become injured. ? Drive. ? Use heavy machinery. ? Drink alcohol. ? Take sleeping pills or medicines that cause  drowsiness. ? Make important decisions or sign legal documents. ? Take care of children on your own.  Rest. Eating and drinking  Follow the diet recommended by your health care provider.  If you vomit: ? Drink water, juice, or soup when you can drink without vomiting. ? Make sure you have little or no nausea before eating solid foods. General instructions  Have a responsible adult stay with you until you are awake and alert.  Take over-the-counter and prescription medicines only as told by your health care provider.  If you smoke, do not smoke without supervision.  Keep all follow-up visits as told by your health care provider. This is important. Contact a health care provider if:  You keep feeling nauseous or you keep vomiting.  You feel light-headed.  You develop a rash.  You have a fever. Get help right away if:  You have trouble breathing. This information is not intended to replace advice given to you by your health care provider. Make sure you discuss any questions you have with your health care provider. Document Released: 07/22/2013 Document Revised: 03/05/2016 Document Reviewed: 01/21/2016 Elsevier Interactive Patient Education  [employer].

## 2018-08-14 ENCOUNTER — Telehealth: Payer: Self-pay | Admitting: Cardiovascular Disease

## 2018-08-14 ENCOUNTER — Encounter (HOSPITAL_COMMUNITY): Payer: Self-pay | Admitting: Cardiovascular Disease

## 2018-08-14 NOTE — Telephone Encounter (Signed)
Spoke with patient who had LHC with Dr. Claiborne Billings yesterday. She only received pages 6-9 of hospital discharge summary. She needs all the pages, as the d/c summary states when she can return to work and the pages she received do not have [facility] listed as the facility. She needs MD to sign the d/c summary. Printed and will place at front desk for pick up.

## 2018-08-14 NOTE — Telephone Encounter (Signed)
New Message:   Pt had a procedure at the hospital  Yesterday. She have paper work for work that filled out. When she took it to work today, they said it had to be completed today and signed. Pt wants to know if she can bring paper over here today for Dr Claiborne Billings?

## 2018-08-15 ENCOUNTER — Telehealth: Payer: Self-pay | Admitting: Cardiology

## 2018-08-15 ENCOUNTER — Ambulatory Visit: Payer: 59 | Admitting: Obstetrics

## 2018-08-15 NOTE — Telephone Encounter (Signed)
Advised patient to keep follow up appointment as scheduled on 09/04/18 with Dr. Bettina Gavia.

## 2018-08-15 NOTE — Telephone Encounter (Signed)
Patient would like to know what her next step is in the process.

## 2018-08-18 ENCOUNTER — Telehealth: Payer: Self-pay | Admitting: Oncology

## 2018-08-18 ENCOUNTER — Telehealth: Payer: Self-pay | Admitting: *Deleted

## 2018-08-18 NOTE — Telephone Encounter (Signed)
Called and spoke with the patient, canceled appt for Friday per [facility] APP. Patient having surgery on Monday

## 2018-08-18 NOTE — Telephone Encounter (Signed)
Lennice Sites, RN with Dr. Joya Gaskins office regarding cardiac clearance for patient after her cardiac cath.  Faxed clearance form to [phone].

## 2018-08-18 NOTE — Telephone Encounter (Signed)
Called Kayla Carey and let her know that her surgery has been scheduled for 08/25/18 pending cardiac clearance.  Also advised her to keep her appointment with Dr. Gerarda Fraction on 08/22/18.  She verbalized understanding and agreement.

## 2018-08-19 ENCOUNTER — Other Ambulatory Visit: Payer: Self-pay

## 2018-08-19 ENCOUNTER — Encounter (HOSPITAL_BASED_OUTPATIENT_CLINIC_OR_DEPARTMENT_OTHER): Payer: Self-pay | Admitting: *Deleted

## 2018-08-19 NOTE — Progress Notes (Signed)
Cardiac clearance note dr Bettina Gavia 08-19-18 on chart for 08-25-18 surgery. Message left by [facility] cross np patient does not need repeat ekg day of surgery, order discontinued in epic, patient does need urine pregnancy day of surgery 08-25-18.

## 2018-08-19 NOTE — Progress Notes (Signed)
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

## 2018-08-20 ENCOUNTER — Ambulatory Visit (HOSPITAL_COMMUNITY): Payer: 59

## 2018-08-21 ENCOUNTER — Ambulatory Visit (HOSPITAL_COMMUNITY): Payer: 59

## 2018-08-22 ENCOUNTER — Ambulatory Visit: Payer: 59 | Admitting: Obstetrics

## 2018-08-25 ENCOUNTER — Encounter (HOSPITAL_BASED_OUTPATIENT_CLINIC_OR_DEPARTMENT_OTHER)
Admission: RE | Disposition: A | Discharge status: Home / Self Care | Payer: Self-pay | Source: Ambulatory Visit | Attending: Obstetrics

## 2018-08-25 ENCOUNTER — Ambulatory Visit (HOSPITAL_BASED_OUTPATIENT_CLINIC_OR_DEPARTMENT_OTHER): Payer: 59 | Admitting: Anesthesiology

## 2018-08-25 ENCOUNTER — Ambulatory Visit (HOSPITAL_BASED_OUTPATIENT_CLINIC_OR_DEPARTMENT_OTHER)
Admission: RE | Admit: 2018-08-25 | Discharge: 2018-08-25 | Disposition: A | Discharge status: Home / Self Care | Payer: 59 | Source: Ambulatory Visit | Attending: Obstetrics | Admitting: Obstetrics

## 2018-08-25 ENCOUNTER — Encounter: Payer: Self-pay | Admitting: Gynecologic Oncology

## 2018-08-25 ENCOUNTER — Encounter (HOSPITAL_BASED_OUTPATIENT_CLINIC_OR_DEPARTMENT_OTHER): Payer: Self-pay

## 2018-08-25 DIAGNOSIS — Z7951 Long term (current) use of inhaled steroids: Secondary | ICD-10-CM | POA: Diagnosis not present

## 2018-08-25 DIAGNOSIS — I10 Essential (primary) hypertension: Secondary | ICD-10-CM | POA: Diagnosis not present

## 2018-08-25 DIAGNOSIS — Z87891 Personal history of nicotine dependence: Secondary | ICD-10-CM | POA: Insufficient documentation

## 2018-08-25 DIAGNOSIS — D060 Carcinoma in situ of endocervix: Secondary | ICD-10-CM | POA: Diagnosis not present

## 2018-08-25 DIAGNOSIS — C539 Malignant neoplasm of cervix uteri, unspecified: Secondary | ICD-10-CM | POA: Diagnosis present

## 2018-08-25 DIAGNOSIS — K219 Gastro-esophageal reflux disease without esophagitis: Secondary | ICD-10-CM | POA: Diagnosis not present

## 2018-08-25 DIAGNOSIS — Z79899 Other long term (current) drug therapy: Secondary | ICD-10-CM | POA: Insufficient documentation

## 2018-08-25 DIAGNOSIS — Z7982 Long term (current) use of aspirin: Secondary | ICD-10-CM | POA: Insufficient documentation

## 2018-08-25 DIAGNOSIS — Z6841 Body Mass Index (BMI) 40.0 and over, adult: Secondary | ICD-10-CM | POA: Insufficient documentation

## 2018-08-25 HISTORY — PX: CERVICAL CONIZATION W/BX: SHX1330

## 2018-08-25 LAB — POCT I-STAT 4, (NA,K, GLUC, HGB,HCT)
GLUCOSE: 99 mg/dL (ref 70–99)
HEMATOCRIT: 38 % (ref 36.0–46.0)
HEMOGLOBIN: 12.9 g/dL (ref 12.0–15.0)
Potassium: 3.6 mmol/L (ref 3.5–5.1)
SODIUM: 141 mmol/L (ref 135–145)

## 2018-08-25 LAB — POCT PREGNANCY, URINE: PREG TEST UR: NEGATIVE

## 2018-08-25 SURGERY — CONE BIOPSY, CERVIX
Anesthesia: General | Site: Vagina

## 2018-08-25 MED ORDER — IBUPROFEN 800 MG PO TABS: 800.0000 mg | ORAL_TABLET | Freq: Three times a day (TID) | ORAL | 0 refills | Status: DC | PRN

## 2018-08-25 MED ORDER — LACTATED RINGERS IV SOLN
INTRAVENOUS | Status: DC
  Administered 2018-08-25: 11:00:00 via INTRAVENOUS
  Filled 2018-08-25: qty 1000

## 2018-08-25 MED ORDER — MIDAZOLAM HCL 2 MG/2ML IJ SOLN
INTRAMUSCULAR | Status: DC | PRN
  Administered 2018-08-25: 2 mg via INTRAVENOUS

## 2018-08-25 MED ORDER — DEXAMETHASONE SODIUM PHOSPHATE 10 MG/ML IJ SOLN
INTRAMUSCULAR | Status: DC | PRN
  Administered 2018-08-25: 10 mg via INTRAVENOUS

## 2018-08-25 MED ORDER — PROPOFOL 10 MG/ML IV BOLUS
INTRAVENOUS | Status: DC | PRN
  Administered 2018-08-25: 150 mg via INTRAVENOUS

## 2018-08-25 MED ORDER — PROMETHAZINE HCL 25 MG/ML IJ SOLN
6.2500 mg | INTRAMUSCULAR | Status: DC | PRN
  Filled 2018-08-25: qty 1

## 2018-08-25 MED ORDER — LIDOCAINE 2% (20 MG/ML) 5 ML SYRINGE
INTRAMUSCULAR | Status: AC
  Filled 2018-08-25: qty 5

## 2018-08-25 MED ORDER — PHENYLEPHRINE HCL 10 MG/ML IJ SOLN
INTRAMUSCULAR | Status: DC | PRN
  Administered 2018-08-25 (×2): 80 ug via INTRAVENOUS
  Administered 2018-08-25: 120 ug via INTRAVENOUS

## 2018-08-25 MED ORDER — LIDOCAINE-EPINEPHRINE 1 %-1:100000 IJ SOLN
INTRAMUSCULAR | Status: DC | PRN
  Administered 2018-08-25: 10 mL

## 2018-08-25 MED ORDER — CEFAZOLIN SODIUM-DEXTROSE 2-4 GM/100ML-% IV SOLN
2.0000 g | INTRAVENOUS | Status: AC
  Administered 2018-08-25: 2 g via INTRAVENOUS
  Filled 2018-08-25: qty 100

## 2018-08-25 MED ORDER — FENTANYL CITRATE (PF) 100 MCG/2ML IJ SOLN
INTRAMUSCULAR | Status: DC | PRN
  Administered 2018-08-25 (×2): 50 ug via INTRAVENOUS

## 2018-08-25 MED ORDER — KETOROLAC TROMETHAMINE 30 MG/ML IJ SOLN
INTRAMUSCULAR | Status: DC | PRN
  Administered 2018-08-25: 30 mg via INTRAVENOUS

## 2018-08-25 MED ORDER — OXYCODONE HCL 5 MG PO TABS
5.0000 mg | ORAL_TABLET | Freq: Once | ORAL | Status: AC | PRN
  Administered 2018-08-25: 5 mg via ORAL
  Filled 2018-08-25: qty 1

## 2018-08-25 MED ORDER — MONSELS FERRIC SUBSULFATE EX SOLN
CUTANEOUS | Status: DC | PRN
  Administered 2018-08-25: 1 via TOPICAL

## 2018-08-25 MED ORDER — FENTANYL CITRATE (PF) 100 MCG/2ML IJ SOLN
25.0000 ug | INTRAMUSCULAR | Status: DC | PRN
  Filled 2018-08-25: qty 1

## 2018-08-25 MED ORDER — FENTANYL CITRATE (PF) 100 MCG/2ML IJ SOLN
INTRAMUSCULAR | Status: AC
  Filled 2018-08-25: qty 2

## 2018-08-25 MED ORDER — DEXAMETHASONE SODIUM PHOSPHATE 10 MG/ML IJ SOLN
INTRAMUSCULAR | Status: AC
  Filled 2018-08-25: qty 1

## 2018-08-25 MED ORDER — OXYCODONE HCL 5 MG/5ML PO SOLN
5.0000 mg | Freq: Once | ORAL | Status: AC | PRN
  Filled 2018-08-25: qty 5

## 2018-08-25 MED ORDER — CEFAZOLIN SODIUM-DEXTROSE 2-4 GM/100ML-% IV SOLN
INTRAVENOUS | Status: AC
  Filled 2018-08-25: qty 100

## 2018-08-25 MED ORDER — ONDANSETRON HCL 4 MG/2ML IJ SOLN
INTRAMUSCULAR | Status: AC
  Filled 2018-08-25: qty 2

## 2018-08-25 MED ORDER — MIDAZOLAM HCL 2 MG/2ML IJ SOLN
INTRAMUSCULAR | Status: AC
  Filled 2018-08-25: qty 2

## 2018-08-25 MED ORDER — ONDANSETRON HCL 4 MG/2ML IJ SOLN
INTRAMUSCULAR | Status: DC | PRN
  Administered 2018-08-25: 4 mg via INTRAVENOUS

## 2018-08-25 MED ORDER — IODINE STRONG (LUGOLS) 5 % PO SOLN
ORAL | Status: DC | PRN
  Administered 2018-08-25: 14 mL

## 2018-08-25 MED ORDER — KETOROLAC TROMETHAMINE 30 MG/ML IJ SOLN
INTRAMUSCULAR | Status: AC
  Filled 2018-08-25: qty 1

## 2018-08-25 MED ORDER — OXYCODONE HCL 5 MG PO TABS
ORAL_TABLET | ORAL | Status: AC
  Filled 2018-08-25: qty 1

## 2018-08-25 MED ORDER — LIDOCAINE 2% (20 MG/ML) 5 ML SYRINGE
INTRAMUSCULAR | Status: DC | PRN
  Administered 2018-08-25: 80 mg via INTRAVENOUS

## 2018-08-25 MED ORDER — PROPOFOL 10 MG/ML IV BOLUS
INTRAVENOUS | Status: AC
  Filled 2018-08-25: qty 20

## 2018-08-25 MED FILL — Ferric Subsulfate Soln: CUTANEOUS | Qty: 8 | Status: AC

## 2018-08-25 SURGICAL SUPPLY — 25 items
BLADE EXTENDED COATED 6.5IN (ELECTRODE) IMPLANT
BLADE SURG 11 STRL SS (BLADE) ×3 IMPLANT
CANISTER SUCT 3000ML PPV (MISCELLANEOUS) IMPLANT
CATH ROBINSON RED A/P 16FR (CATHETERS) ×3 IMPLANT
COVER WAND RF STERILE (DRAPES) ×3 IMPLANT
DILATOR CANAL MILEX (MISCELLANEOUS) ×3 IMPLANT
ELECT BALL LEEP 3MM BLK (ELECTRODE) ×3 IMPLANT
GLOVE BIO SURGEON STRL SZ 6.5 (GLOVE) ×2 IMPLANT
GLOVE BIO SURGEON STRL SZ7 (GLOVE) ×3 IMPLANT
GLOVE BIO SURGEONS STRL SZ 6.5 (GLOVE) ×1
GLOVE BIOGEL PI IND STRL 7.0 (GLOVE) ×2 IMPLANT
GLOVE BIOGEL PI INDICATOR 7.0 (GLOVE) ×4
GOWN STRL REUS W/TWL LRG LVL3 (GOWN DISPOSABLE) ×9 IMPLANT
HEMOSTAT SURGICEL 4X8 (HEMOSTASIS) ×3 IMPLANT
KIT TURNOVER CYSTO (KITS) ×3 IMPLANT
NEEDLE SPNL 22GX3.5 QUINCKE BK (NEEDLE) ×3 IMPLANT
NS IRRIG 500ML POUR BTL (IV SOLUTION) ×3 IMPLANT
PACK VAGINAL WOMENS (CUSTOM PROCEDURE TRAY) ×3 IMPLANT
SCOPETTES 8  STERILE (MISCELLANEOUS) ×2
SCOPETTES 8 STERILE (MISCELLANEOUS) ×1 IMPLANT
SUT VIC AB 0 CT1 36 (SUTURE) ×3 IMPLANT
SUT VICRYL 0 UR6 27IN ABS (SUTURE) ×6 IMPLANT
SYR BULB IRRIGATION 50ML (SYRINGE) ×3 IMPLANT
TOWEL OR 17X24 6PK STRL BLUE (TOWEL DISPOSABLE) ×6 IMPLANT
VACUUM HOSE/TUBING 7/8INX6FT (MISCELLANEOUS) IMPLANT

## 2018-08-25 NOTE — Op Note (Signed)
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 No gross lesions encountered.  Estimated Blood Loss:  Minimal      Specimens: cervical cone tagged at 12:00, endocervical curettage         Complications:  None; patient tolerated the procedure well.         Disposition: PACU - hemodynamically stable.  Procedure Details: The patient was then taken to the operating room and placed in the supine position with SCD hose on. General anesthesia was then induced without difficulty. She was then placed in the dorsolithotomy position. The perineum was prepped with Betadine. The vagina was prepped with Betadine. The patient was then draped after the prep was dried. An in and out catheterization to empty the bladder was performed under sterile conditions.  Timeout was performed the patient, procedure, antibiotic, allergy, and length of procedure.   The weighted speculum was placed in the posterior vagina. The right angle retractor was placed anteriorally to visualize the cervix. A 0-vicryl suture on a UR-6 needle was used to place stay sutures (which were tagged) at 3 and 9 o'clock of the cervicovaginal junction. 1% lidocaine with epinephrine was infiltrated at 2, 4, 8, 10 o'clock circumferentially around the face of the cervix, and deep in the endocervical canal. An 11 blade scalpel on a long knife handle was used to make an incision around the face of the cervix, inside of the stay sutures, circumferentially. A tenaculum and forcep with teeth was used to grasp the specimen to manipulate it. The incision was then continued with Mayo scissors by angling towards the  endocervix to amputate the specimen. It was removed, oriented with a marking stitch at the 12 o'clock ectocervix and sent to pathology.   A post-cone ECC was collected from the endocervical canal using a Box curette.  The bovie with the 71mm ball attachment was used at 50 coag to create hemostasis at the surgical bed. A monsels soaked Surgicell was applied to the surgical bed to consolidate this hemostasis.  All instrument, suture, laparotomy, Ray-Tec, and needle counts were correct x2. The patient tolerated the procedure well and was taken recovery room in stable condition.

## 2018-08-25 NOTE — Discharge Instructions (Signed)
NO ADVIL, ALEVE, MOTRIN, IBUPROFEN UNTIL 5:15 TODAY   Post Anesthesia Home Care Instructions  Activity: Get plenty of rest for the remainder of the day. A responsible adult should stay with you for 24 hours following the procedure.  For the next 24 hours, DO NOT: -Drive a car -Paediatric nurse -Drink alcoholic beverages -Take any medication unless instructed by your physician -Make any legal decisions or sign important papers.  Meals: Start with liquid foods such as gelatin or soup. Progress to regular foods as tolerated. Avoid greasy, spicy, heavy foods. If nausea and/or vomiting occur, drink only clear liquids until the nausea and/or vomiting subsides. Call your physician if vomiting continues.  Special Instructions/Symptoms: Your throat may feel dry or sore from the anesthesia or the breathing tube placed in your throat during surgery. If this causes discomfort, gargle with warm salt water. The discomfort should disappear within 24 hours.  If you had a scopolamine patch placed behind your ear for the management of post- operative nausea and/or vomiting:  1. The medication in the patch is effective for 72 hours, after which it should be removed.  Wrap patch in a tissue and discard in the trash. Wash hands thoroughly with soap and water. 2. You may remove the patch earlier than 72 hours if you experience unpleasant side effects which may include dry mouth, dizziness or visual disturbances. 3. Avoid touching the patch. Wash your hands with soap and water after contact with the patch.

## 2018-08-25 NOTE — Anesthesia Postprocedure Evaluation (Signed)
Anesthesia Post Note  Patient: Kayla Carey  Procedure(s) Performed: CONIZATION CERVIX WITH BIOPSY AND ENDOCERVICAL CURETTAGE (N/A Vagina )     Patient location during evaluation: PACU Anesthesia Type: General Level of consciousness: awake and alert Pain management: pain level controlled Vital Signs Assessment: post-procedure vital signs reviewed and stable Respiratory status: spontaneous breathing, nonlabored ventilation and respiratory function stable Cardiovascular status: blood pressure returned to baseline and stable Postop Assessment: no apparent nausea or vomiting Anesthetic complications: no    Last Vitals:  Vitals:   08/25/18 1415 08/25/18 1417  BP: (!) 152/105 (!) 160/99  Pulse: 72 74  Resp: 13 11  Temp:    SpO2: 98% 98%    Last Pain:  Vitals:   08/25/18 1415  TempSrc:   PainSc: [place]

## 2018-08-25 NOTE — Interval H&P Note (Signed)
History and Physical Interval Note:  08/25/2018 12:24 PM  Kayla Carey  has presented today for surgery, with the diagnosis of CERVICAL CANCER VS CERVICAL DYSPLASIA  The various methods of treatment have been discussed with the patient and family. After consideration of risks, benefits and other options for treatment, the patient has consented to  Procedure(s): CONIZATION CERVIX WITH BIOPSY AND ENDOCERVICAL CURETTAGE (N/A) as a surgical intervention .  The patient's history has been reviewed, patient examined, no change in status, stable for surgery.  I have reviewed the patient's chart and labs.  Questions were answered to the patient's satisfaction.     Isabel Caprice

## 2018-08-25 NOTE — H&P (Signed)
Chief Complaint  Patient presents with  . Malignant neoplasm of cervix, unspecified site [facility])    GYN Oncologic Summary 1. TBD ? .  HPI: Kayla Carey  is a very nice 47 y.o.  P3  She was seen for routine exam after living in [place] and receiving most of her care there. She had a screening Pap by her PCP. 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 She was referred to Gynecology for further workup.  07/15/18 Colposcopy with biopsies revealed: 1. 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 -NO INVASIVE NEOPLASM IDENTIFIED. -SEE COMMENT. 2. Cervix, biopsy, 3 o'clock -CERVICAL TRANSFORMATION ZONE MUCOSA WITH CIN-III (SEVERE SQUAMOUS DYSPLASIA/SQUAMOUS CARCINOMA IN SITU; HIGH GRADE SQUAMOUS INTRAEPITHELIAL LESION). -NO INVASIVE NEOPLASM IDENTIFIED. -SEE COMMENT. 3. Cervix, biopsy, 6 o'clock -FRAGMENTED SQUAMOUS MUCOSA WITH CIN-III (SEVERE SQUAMOUS DYSPLASIA/SQUAMOUS CARCINOMA IN SITU; HIGH GRADE SQUAMOUS INTRAEPITHELIAL LESION). -CANNOT RULE OUT HIGHER GRADE LESION. 4. Cervix, biopsy, 9 o'clock -FOCI OF SUBMUCOSAL INVASIVE SQUAMOUS CELL CARCINOMA IN A SETTING OF FRAGMENTED CERVICAL TRANSFORMATION ZONE MUCOSA WITH CIN-III (SEVERE SQUAMOUS DYSPLASIA/SQUAMOUS CARCINOMA IN SITU; HIGH GRADE SQUAMOUS INTRAEPITHELIAL LESION). -SEE COMMENT. The portion of the endocervical stroma containing invasive squamous cell carcinoma is heavily inflamed, unoriented and devoid of surface mucosa. In this setting, the depth of invasion cannot be accurately determined. 5. Endocervix, curettage - FRAGMENTED CERVICAL TRANSFORMATION ZONE MUCOSA WITH CIN-III (SEVERE SQUAMOUS DYSPLASIA/SQUAMOUS CARCINOMA IN SITU; HIGH GRADE SQUAMOUS INTRAEPITHELIAL LESION). - CERVICAL STROMA NOT PRESENT FOR EVALUATION. - SEE COMMENT.  Patient denies any post-coital bleeding, but it is hard to get a history  whether she has had intercourse in the last 6 months.  Her menses have been spacing out and she has noted no increased vaginal bleeding. She has noticed some spotting since the biopsies done a few days ago.  Occasional sharp right sided pain but it is fleeting. Denies N/V.  Since seeing her and planning surgery she required cardiac workup and there has been some delay getting her into the OR due to that. She is now cleared for surgery  Imported EPIC Oncologic History:   No history exists.    Measurement of disease: pending  .  Radiology: ImagingResults  Dg Hip Unilat With Pelvis 2-3 Views Right  Result Date: 06/16/2018 CLINICAL DATA:  Twisted the right leg now with right hip pain EXAM: DG HIP (WITH OR WITHOUT PELVIS) 2-3V RIGHT COMPARISON:  None. FINDINGS: SI joints are non widened. Pubic symphysis and rami appear intact. No fracture or malalignment. IMPRESSION: No acute osseous abnormality. Electronically Signed   By: Donavan Foil M.D.   On: 06/16/2018 16:47            Outpatient Encounter Medications as of 07/23/2018  Medication Sig  . albuterol (PROVENTIL HFA;VENTOLIN HFA) 108 (90 Base) MCG/ACT inhaler Inhale into the lungs every 6 (six) hours as needed for wheezing or shortness of breath.  Marland Kitchen amLODipine (NORVASC) 5 MG tablet Take 5 mg by mouth daily.  . Ascorbic Acid (VITAMIN C) 1000 MG tablet Take 1,000 mg by mouth daily. Patient states she takes vitamin C 2000mg  daily  . aspirin 325 MG tablet Take 325 mg by mouth daily.  . calcium-vitamin D (OSCAL WITH D) 500-200 MG-UNIT tablet Take 1 tablet by mouth daily.  . cyclobenzaprine (FLEXERIL) 10 MG tablet 1/2 to 1 tablet every 8 hours prn muscle spasm and pain (Patient taking differently: Take 10 mg by mouth as needed. 1/2 to 1 tablet every 8  hours prn muscle spasm and pain)  . ferrous sulfate 325 (65 FE) MG tablet Take 325 mg by mouth daily.  . hydrALAZINE (APRESOLINE) 25 MG tablet Take 25 mg by mouth 3 (three) times  daily.  Marland Kitchen losartan-hydrochlorothiazide (HYZAAR) 100-12.5 MG tablet Take 1 tablet by mouth daily.  . metoprolol tartrate (LOPRESSOR) 25 MG tablet Take 25 mg by mouth 2 (two) times daily.  . potassium chloride (K-DUR) 10 MEQ tablet Take 10 mEq by mouth daily.  . [DISCONTINUED] diclofenac sodium (VOLTAREN) 1 % GEL Apply 4 g topically 4 (four) times daily. (Patient not taking: Reported on 05/27/2018)  . [DISCONTINUED] naproxen (NAPROSYN) 500 MG tablet Take 1 tablet (500 mg total) by mouth 2 (two) times daily. (Patient not taking: Reported on 07/15/2018)   No facility-administered encounter medications on file as of 07/23/2018.    No Known Allergies      Past Medical History:  Diagnosis Date  . Enlarged heart 2014   diagnosed in [place]  . Hypertension           Past Surgical History:  Procedure Laterality Date  . BREAST SURGERY     Lumpectomy-benign        Past Gynecological History:   GYNECOLOGIC HISTORY:   No LMP recorded. (Menstrual status: Irregular Periods).   Menarche: 47 years old  P 3  Contraceptive None  HRT NA  Last Pap ?never before workup Family Hx:       Family History  Problem Relation Age of Onset  . Hypertension Mother   . Congestive Heart Failure Mother   . Hypertension Father   . Diabetes Father   . Cancer Father        Prostate  . Kidney cancer Son        son died from disease   Social Hx:   Tobacco use: quit ~2 weeks ago  Alcohol use: 1 drink per day  Illicit Drug use: none  Illicit IV Drug use: none    Review of Systems: none  Vitals:  Vitals:   08/25/18 1026  BP: (!) 145/90  Pulse: 70  Resp: 17  Temp: 98.6 F (37 C)  SpO2: 100%     Physical Exam: General :  Well developed, 47 y.o., female in no apparent distress HEENT:  Normocephalic/atraumatic, symmetric, EOMI, eyelids normal Neck:   No visible masses.  Respiratory:  Respirations unlabored, no use of accessory muscles CV:   Deferred Breast:   Deferred Musculoskeletal: Normal muscle strength. Abdomen:   No visible masses or protrusion Extremities:  No visible edema or deformities Skin:   Normal inspection Neuro/Psych:  No focal motor deficit, no abnormal mental status. Normal gait. Normal affect. Alert and oriented to person, place, and time   Genito Urinary: from 07/23/18 Vulva: Normal external female genitalia.  Bladder/urethra: Urethral meatus normal in size and location. No lesions or   masses, well supported bladder Speculum exam: Vagina: No lesion, no discharge, no bleeding. Cervix: No gross lesions, question something at internal os, but some abnormalities are due to recent biopsies. Somewhat ecchymotic again likely 2/2 recent biopsies. No fungating or ulcerative lesion Bimanual exam: No palpable lesion on bimanual  Uterus: Normal size, mobile.              Adnexa: No masses. Rectovaginal:     Good tone, no masses, no cul de sac nodularity, no parametrial involvement or nodularity.   Assessment  CIS versus SCCa Cervix ECOG PERFORMANCE STATUS: 0 - Asymptomatic  Plan  1. Complexity  of visit ? This is a new problem and additional workup is planned ? Data reviewed ? There are no relevant images to review ? If we end up diagnosing Stage IB then we'll likely order CT+/-PET ? I reviewed her referring doctor's office notes and I have summarized in the HPI ? History was obtained from the patient and her family along with her chart. ? This is an undiagnosed new problem with unknown prognosis. Minor surgery with risk factors will be recommended. 2. She may be early stage SCCa Cervix since I do not see a gross lesion ? Cone biopsy is indicated. 3. Pending further tissue it cannot be determined if we will recommend simple hysterectomy versus radical and I explained this to the family and the patient today. 4. They also understand we will have to wait a few weeks after the cone to do any definitive surgery. 5. We have not  yet addressed the infectious nature of cervical cancer and indication for STD testing. This will need to happen at future visits.

## 2018-08-25 NOTE — Anesthesia Procedure Notes (Signed)
Procedure Name: LMA Insertion Date/Time: 08/25/2018 12:39 PM Performed by: Wanita Chamberlain, CRNA Pre-anesthesia Checklist: Patient identified, Timeout performed, Emergency Drugs available and Suction available Patient Re-evaluated:Patient Re-evaluated prior to induction Oxygen Delivery Method: Circle system utilized Preoxygenation: Pre-oxygenation with 100% oxygen Induction Type: IV induction Ventilation: Mask ventilation without difficulty LMA: LMA inserted LMA Size: 4.0 Number of attempts: 1 Placement Confirmation: CO2 detector,  positive ETCO2 and breath sounds checked- equal and bilateral Tube secured with: Tape Dental Injury: Teeth and Oropharynx as per pre-operative assessment

## 2018-08-25 NOTE — Anesthesia Preprocedure Evaluation (Addendum)
Anesthesia Evaluation  Patient identified by MRN, date of birth, ID band Patient awake    Reviewed: Allergy & Precautions, NPO status , Patient's Chart, lab work & pertinent test results  History of Anesthesia Complications Negative for: history of anesthetic complications  Airway Mallampati: II  TM Distance: >3 FB Neck ROM: Full    Dental  (+) Dental Advisory Given, Loose, Chipped,    Pulmonary former smoker,    breath sounds clear to auscultation       Cardiovascular hypertension, Pt. on medications and Pt. on home beta blockers  Rhythm:Regular Rate:Normal   '19 Cath - Normal epicardial coronary arteries with a dominant RCA coronary circulation. Significant elevation of LVEDP at 40 mm in this patient with hyperdynamic LV function and severe LVH with diastolic dysfunction.  '19 Myoperfusion - Baseline EKG with LVH and repolarization abnormality with no change during infusion. The left ventricular ejection fraction is normal (55-65%). The perfusion study is normal. There is transient ischemic dilatation with a TID of 1.3 which can be seen with multivessel CAD. Consider coronary CTA for further evaluation. This is an intermediate risk study due to transient ischemic dilatation.  '19 TTE - Severe LVH. EF 75% to 80%. Grade 1 diastolic dysfunction. LA was moderately dilated. RA was mildly dilated.     Neuro/Psych negative neurological ROS  negative psych ROS   GI/Hepatic Neg liver ROS, GERD  Controlled,  Endo/Other  Morbid obesity  Renal/GU negative Renal ROS     Musculoskeletal negative musculoskeletal ROS (+)   Abdominal (+) + obese,   Peds  Hematology negative hematology ROS (+)   Anesthesia Other Findings   Reproductive/Obstetrics                          Anesthesia Physical Anesthesia Plan  ASA: III  Anesthesia Plan: General   Post-op Pain Management:    Induction:  Intravenous  PONV Risk Score and Plan: 4 or greater and Treatment may vary due to age or medical condition, Ondansetron, Dexamethasone and Midazolam  Airway Management Planned: LMA  Additional Equipment: None  Intra-op Plan:   Post-operative Plan: Extubation in OR  Informed Consent: I have reviewed the patients History and Physical, chart, labs and discussed the procedure including the risks, benefits and alternatives for the proposed anesthesia with the patient or authorized representative who has indicated his/her understanding and acceptance.   Dental advisory given  Plan Discussed with: CRNA and Anesthesiologist  Anesthesia Plan Comments:        Anesthesia Quick Evaluation

## 2018-08-25 NOTE — Transfer of Care (Signed)
Immediate Anesthesia Transfer of Care Note  Patient: Kayla Carey  Procedure(s) Performed: CONIZATION CERVIX WITH BIOPSY AND ENDOCERVICAL CURETTAGE (N/A Vagina )  Patient Location: PACU  Anesthesia Type:General  Level of Consciousness: awake, alert , oriented and patient cooperative  Airway & Oxygen Therapy: Patient Spontanous Breathing and Patient connected to nasal cannula oxygen  Post-op Assessment: Report given to RN and Post -op Vital signs reviewed and stable  Post vital signs: Reviewed and stable  Last Vitals:  Vitals Value Taken Time  BP    Temp    Pulse 78 08/25/2018  1:29 PM  Resp 8 08/25/2018  1:29 PM  SpO2 100 % 08/25/2018  1:29 PM  Vitals shown include unvalidated device data.  Last Pain:  Vitals:   08/25/18 1035  TempSrc:   PainSc: 6       Patients Stated Pain Goal: 5 ([id number])  Complications: No apparent anesthesia complications

## 2018-08-26 ENCOUNTER — Telehealth: Payer: Self-pay | Admitting: Family Medicine

## 2018-08-26 ENCOUNTER — Encounter (HOSPITAL_BASED_OUTPATIENT_CLINIC_OR_DEPARTMENT_OTHER): Payer: Self-pay | Admitting: Obstetrics

## 2018-08-26 NOTE — Telephone Encounter (Signed)
Copied from [place] [id number]. Topic: General - Inquiry >> Aug 26, 2018 11:02 AM Conception Chancy, NT wrote: Reason for CRM: patient is calling and states she would like for the clinical staff to check with Dr. Nolon Rod to see if she can get the flu vaccine with her health conditions.

## 2018-08-28 NOTE — Telephone Encounter (Signed)
Please let the patient know that I see that she recently had surgery and a new diagnosis of cervical cancer so I would advise that she get a flu vaccination. She can get a flu vaccine with [place] as a flu appointment.  Dr. Nolon Rod

## 2018-08-28 NOTE — Telephone Encounter (Signed)
Please see note below and advise  

## 2018-08-29 NOTE — Telephone Encounter (Signed)
Pt will get the flu shot at her pharm

## 2018-09-01 ENCOUNTER — Ambulatory Visit: Payer: 59 | Admitting: Cardiology

## 2018-09-03 ENCOUNTER — Telehealth: Payer: Self-pay | Admitting: *Deleted

## 2018-09-03 NOTE — Telephone Encounter (Signed)
Patient called in to say that she was having severe tooth pain and that her dentist gave her Ultram to take and she wanted to know if this would be ok to take since she just has surgery on 08/25/18.  Per Joylene John, NP the ultram will be fine to take.  Patient verbalized understanding.

## 2018-09-04 ENCOUNTER — Ambulatory Visit: Payer: 59 | Admitting: Cardiology

## 2018-09-08 ENCOUNTER — Encounter: Payer: Self-pay | Admitting: Oncology

## 2018-09-08 NOTE — Progress Notes (Signed)
Gynecologic Oncology Multi-Disciplinary Disposition Conference Note  Date of the Conference: 09/08/2018  Patient Name: Kayla Carey  Referring Provider: Primary GYN Oncologist:  Stage/Disposition:  Stage 1A cervical cancer. Disposition is for pathology to review LVSI. If negative than simple hysterectomy.  If positive than radical hysterectomy.   This Multidisciplinary conference took place involving physicians from [place], [place], Radiation Oncology, Pathology, Radiology along with the Gynecologic Oncology Nurse Practitioner and RN.  Comprehensive assessment of the patient's malignancy, staging, need for surgery, chemotherapy, radiation therapy, and need for further testing were reviewed. Supportive measures, both inpatient and following discharge were also discussed. The recommended plan of care is documented. Greater than 35 minutes were spent correlating and coordinating this patient's care.

## 2018-09-09 NOTE — Progress Notes (Signed)
Kayla Carey   Progress Note: Established Patient Follow-Up VISIT   Consult was originally requested by Dr. Donalynn Furlong for a cervical biopsy showing SCCa   Chief Complaint  Patient presents with  . Malignant neoplasm of cervix, unspecified site [facility])    GYN Oncologic Summary 1. Stage IA1 SCCa Cervix o 08/25/18 Cervical CKConization o TBD  HPI: Kayla Carey  is a very nice 47 y.o.  P3   Interval History  On 08/25/18 I took her for a cervical conization. Final pathology revealed: 1. Cervix, cone - INVASIVE SQUAMOUS CELL CARCINOMA, WELL-DIFFERENTIATED. - TUMOR SPANS 3 MM IN WIDTH, 2 MM IN LENGTH, AND 2 MM IN DEPTH. - BACKGROUND HIGH GRADE SQUAMOUS INTRAEPITHELIAL LESION, CIN-III (SEVERE DYSPLASIA/CIS) WITH ENDOCERVICAL GLAND INVOLVEMENT. - MARGINS ARE NEGATIVE FOR INVASIVE CARCINOMA. - CIN-III EXTENDS TO ENDOCERVICAL MARGIN BROADLY. - SEE COMMENT. 2. Endocervix, curettage - DETACHED FRAGMENTS OF AT LEAST HIGH GRADE SQUAMOUS INTRAEPITHELIAL LESION, CIN-III (SEVERE DYSPLASIA/CIS). - SEE COMMENT. Microscopic Comment 1. The invasive carcinoma is located at roughly 10:00 position and is 2 mm from the ectocervical margin. CIN-III is present at the endocervical margin from roughly 12:00-3:00. An oncology table is not required for cervical cone excisions. 2. Fragments of at least CIN-III are detached and not associated with stroma, thus invasion can not be assessed. 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  She presents to discuss further management.  Some spotting still, but no recent large clots or heavy bleeding.    Oncologic History She was seen for routine exam after living in [place] and receiving most of her care there. She had a screening Pap by her PCP. 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 She was referred to Gynecology for  further workup.  07/15/18 Colposcopy with biopsies revealed: 1. 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 -NO INVASIVE NEOPLASM IDENTIFIED. -SEE COMMENT. 2. Cervix, biopsy, 3 o'clock -CERVICAL TRANSFORMATION ZONE MUCOSA WITH CIN-III (SEVERE SQUAMOUS DYSPLASIA/SQUAMOUS CARCINOMA IN SITU; HIGH GRADE SQUAMOUS INTRAEPITHELIAL LESION). -NO INVASIVE NEOPLASM IDENTIFIED. -SEE COMMENT. 3. Cervix, biopsy, 6 o'clock -FRAGMENTED SQUAMOUS MUCOSA WITH CIN-III (SEVERE SQUAMOUS DYSPLASIA/SQUAMOUS CARCINOMA IN SITU; HIGH GRADE SQUAMOUS INTRAEPITHELIAL LESION). -CANNOT RULE OUT HIGHER GRADE LESION. 4. Cervix, biopsy, 9 o'clock -FOCI OF SUBMUCOSAL INVASIVE SQUAMOUS CELL CARCINOMA IN A SETTING OF FRAGMENTED CERVICAL TRANSFORMATION ZONE MUCOSA WITH CIN-III (SEVERE SQUAMOUS DYSPLASIA/SQUAMOUS CARCINOMA IN SITU; HIGH GRADE SQUAMOUS INTRAEPITHELIAL LESION). -SEE COMMENT. The portion of the endocervical stroma containing invasive squamous cell carcinoma is heavily inflamed, unoriented and devoid of surface mucosa. In this setting, the depth of invasion cannot be accurately determined. 5. Endocervix, curettage - FRAGMENTED CERVICAL TRANSFORMATION ZONE MUCOSA WITH CIN-III (SEVERE SQUAMOUS DYSPLASIA/SQUAMOUS CARCINOMA IN SITU; HIGH GRADE SQUAMOUS INTRAEPITHELIAL LESION). - CERVICAL STROMA NOT PRESENT FOR EVALUATION. - SEE COMMENT.  Patient denied any post-coital bleeding, but it is hard to get a history whether she has had intercourse in the last 6 months. Her menses have been spacing out and she has noted no increased vaginal bleeding. She has noticed some spotting since the biopsies done a few days ago. Occasional sharp right sided pain but it is fleeting. Denies N/V.  Imported EPIC Oncologic History:   No history exists.    Measurement of disease: Will be clinical exam and Pap  given her low Stage disease . Marland Kitchen  Radiology: Dg Chest 2 View  Result Date: 08/12/2018 CLINICAL DATA:  Preoperative evaluation. History of cervical carcinoma. Hypertension. EXAM: CHEST - 2 VIEW COMPARISON:  December 25, 2017 FINDINGS: Lungs are clear. Heart is enlarged with pulmonary vascularity normal. No adenopathy. No bone lesions. IMPRESSION: There is cardiomegaly.  No edema or consolidation.  No adenopathy. Electronically Signed   By: Lowella Grip III M.D.   On: 08/12/2018 08:43   Dg Hip Unilat With Pelvis 2-3 Views Right  Result Date: 06/16/2018 CLINICAL DATA:  Twisted the right leg now with right hip pain EXAM: DG HIP (WITH OR WITHOUT PELVIS) 2-3V RIGHT COMPARISON:  None. FINDINGS: SI joints are non widened. Pubic symphysis and rami appear intact. No fracture or malalignment. IMPRESSION: No acute osseous abnormality. Electronically Signed   By: Donavan Foil M.D.   On: 06/16/2018 16:47    Outpatient Encounter Medications as of 09/10/2018  Medication Sig  . albuterol (PROVENTIL HFA;VENTOLIN HFA) 108 (90 Base) MCG/ACT inhaler Inhale 1 puff into the lungs every 6 (six) hours as needed for wheezing or shortness of breath.   Marland Kitchen amLODipine (NORVASC) 5 MG tablet Take 5 mg by mouth daily.   . Ascorbic Acid (VITAMIN C) 1000 MG tablet Take 1,000 mg by mouth daily.   . Cholecalciferol (VITAMIN D3) 2000 units capsule Take 2,000 Units by mouth daily.  . ferrous sulfate 325 (65 FE) MG tablet Take 325 mg by mouth daily.   . furosemide (LASIX) 40 MG tablet Take 1 tablet (40 mg total) by mouth 2 (two) times daily.  . hydrALAZINE (APRESOLINE) 25 MG tablet Take 25 mg by mouth 3 (three) times daily.   Marland Kitchen ibuprofen (ADVIL,MOTRIN) 800 MG tablet Take 1 tablet (800 mg total) by mouth every 8 (eight) hours as needed.  Marland Kitchen losartan-hydrochlorothiazide (HYZAAR) 100-12.5 MG tablet Take 1 tablet by mouth every morning.   . metoprolol tartrate (LOPRESSOR) 25 MG tablet Take 25 mg by mouth every morning.   . penicillin  v potassium (VEETID) 500 MG tablet Take 500 mg by mouth 4 (four) times daily.  . potassium chloride (K-DUR) 10 MEQ tablet Take 10 mEq by mouth every morning.   . traMADol (ULTRAM) 50 MG tablet Take 50 mg by mouth every 6 (six) hours as needed. For tooth ache pain   No facility-administered encounter medications on file as of 09/10/2018.    No Known Allergies  Past Medical History:  Diagnosis Date  . Cervical cancer ([place])   . Chest pain    07-28-2018  per pt has seen a cardiologist, dr Donnella Bi, in past 3-4 months ago and had a stress test , was told it was ok   . Enlarged heart 2014   diagnosed in [place]  . GERD (gastroesophageal reflux disease)    per pt just drinks water  . Hypertension   . IDA (iron deficiency anemia)   . Joint pain    last few days as of 08-19-18   Past Surgical History:  Procedure Laterality Date  . BREAST SURGERY Left age 74   Lumpectomy-benign  . CERVICAL CONIZATION W/BX N/A 08/25/2018   Procedure: CONIZATION CERVIX WITH BIOPSY AND ENDOCERVICAL CURETTAGE;  Surgeon: Isabel Caprice, MD;  Location: [facility];  Service: Gynecology;  Laterality: N/A;  . LEFT HEART CATH AND CORONARY ANGIOGRAPHY N/A 08/13/2018   Procedure: LEFT HEART CATH AND CORONARY ANGIOGRAPHY;  Surgeon: Troy Sine, MD;  Location: [facility];  Service: Cardiovascular;  Laterality: N/A;  Past Gynecological History:   GYNECOLOGIC HISTORY:  . No LMP recorded. (Menstrual status: Irregular Periods).  . Menarche: 47 years old . P 3 . Contraceptive None . HRT NA  . Last Pap ?never before workup Family Hx:  Family History  Problem Relation Age of Onset  . Hypertension Mother   . Congestive Heart Failure Mother   . Hypertension Father   . Diabetes Father   . Cancer Father        Prostate  . Kidney cancer Son        son died from disease   Social Hx:  Marland Kitchen Tobacco use: quit ~2 weeks ago . Alcohol use: 1 drink per day . Illicit Drug use: none . Illicit IV  Drug use: none    Review of Systems: Review of Systems  Respiratory: Positive for shortness of breath.   Gastrointestinal: Positive for constipation.  Endocrine: Positive for hot flashes.  Genitourinary: Positive for vaginal bleeding and vaginal discharge.   Musculoskeletal: Positive for arthralgias.  All other systems reviewed and are negative.    Vitals:   Vitals:   09/10/18 1201  Weight: 260 lb 9.6 oz (118.2 kg)  Height: 5\' 7"  (1.702 m)   Body mass index is 40.82 kg/m.  Physical Exam: General :  Overweight. Well developed, 48 y.o., female in no apparent distress HEENT:  Normocephalic/atraumatic, symmetric, EOMI, eyelids normal Neck:   No visible masses.  Respiratory:  Respirations unlabored, no use of accessory muscles CV:   Deferred Breast:  Deferred Musculoskeletal: Normal muscle strength. Abdomen:   No visible masses or protrusion Extremities:  No visible edema or deformities Skin:   Normal inspection Neuro/Psych:  No focal motor deficit, no abnormal mental status. Normal gait. Normal affect. Alert and oriented to person, place, and time       Assessment  Stage IA1 SCCa Cervix ECOG PERFORMANCE STATUS: 0 - Asymptomatic  Plan  1. Surgical pathology was reviewed and she understands this is cancer, but luckily seems to have been caught early. ? She is given a copy of the report today. 2. Management ? Given this is Stage IA1 with no LVSI, per NCCN guidelines she is a candidate for a simple hysterectomy ? She is 47 yo and does not desire fertility, however, given the low risk of ovarian metastases I discouraged ovarian removal given her age<50. She agrees to retention of the ovaries. ? I did offer bilateral salpingectomy and she would like to proceed. 3. Surgical discussion ? We reviewed the risks, benefits, and alternatives using the surgical sketch today. She was given a copy of this. ? Plan is for RA-TLH/bilateral salpingectomy 4. We have not yet addressed the  infectious nature of cervical cancer and indication for STD testing. This will need to happen at future visits.  15  minutes of direct face to face counseling time was spent with the patient. This included discussion about prognosis, therapy recommendations including additional surgery, and postoperative side effects that are beyond the scope of routine postoperative care.   Isabel Caprice, MD  09/11/2018, 9:26 PM    Cc: Donalynn Furlong, MD (Referring Ob/Gyn) Forrest Moron, MD (PCP)

## 2018-09-10 ENCOUNTER — Inpatient Hospital Stay: Payer: 59 | Attending: Obstetrics | Admitting: Obstetrics

## 2018-09-10 ENCOUNTER — Encounter: Payer: Self-pay | Admitting: Obstetrics

## 2018-09-10 VITALS — BP 157/96 | HR 85 | Temp 98.2°F | Resp 20 | Ht 67.0 in | Wt 260.6 lb

## 2018-09-10 DIAGNOSIS — C539 Malignant neoplasm of cervix uteri, unspecified: Secondary | ICD-10-CM

## 2018-09-10 NOTE — Patient Instructions (Signed)
Preparing for your Surgery  Plan for surgery on October 14, 2018 with Dr. Precious Haws at [facility] will be scheduled for a robotic assisted laparoscopic total hysterectomy, bilateral salpingectomy, and any indicated procedures.   Pre-operative Testing -You will receive a phone call from presurgical testing at [facility] to arrange for a pre-operative testing appointment before your surgery.  This appointment normally occurs one to two weeks before your scheduled surgery.   -Bring your insurance card, copy of an advanced directive if applicable, medication list  -At that visit, you will be asked to sign a consent for a possible blood transfusion in case a transfusion becomes necessary during surgery.  The need for a blood transfusion is rare but having consent is a necessary part of your care.     -You should not be taking blood thinners or aspirin at least ten days prior to surgery unless instructed by your surgeon.  Day Before Surgery at [facility] will be asked to take in a light diet the day before surgery.  Avoid carbonated beverages.  You will be advised to have nothing to eat or drink after midnight the evening before.    Eat a light diet the day before surgery.  Examples including soups, broths, toast, yogurt, mashed potatoes.  Things to avoid include carbonated beverages (fizzy beverages), raw fruits and raw vegetables, or beans.   If your bowels are filled with gas, your surgeon will have difficulty visualizing your pelvic organs which increases your surgical risks.  Your role in recovery Your role is to become active as soon as directed by your doctor, while still giving yourself time to heal.  Rest when you feel tired. You will be asked to do the following in order to speed your recovery:  - Cough and breathe deeply. This helps toclear and expand your lungs and can prevent pneumonia. You may be given a spirometer to practice deep breathing.  A staff member will show you how to use the spirometer. - Do mild physical activity. Walking or moving your legs help your circulation and body functions return to normal. A staff member will help you when you try to walk and will provide you with simple exercises. Do not try to get up or walk alone the first time. - Actively manage your pain. Managing your pain lets you move in comfort. We will ask you to rate your pain on a scale of zero to 10. It is your responsibility to tell your doctor or nurse where and how much you hurt so your pain can be treated.  Special Considerations -If you are diabetic, you may be placed on insulin after surgery to have closer control over your blood sugars to promote healing and recovery.  This does not mean that you will be discharged on insulin.  If applicable, your oral antidiabetics will be resumed when you are tolerating a solid diet.  -Your final pathology results from surgery should be available around one week after surgery and the results will be relayed to you when available.  -Dr. Lahoma Crocker is the Surgeon that assists your GYN Oncologist with surgery.  The next day after your surgery you will either see your GYN Oncologist, Dr. Everitt Amber, or Dr. Lahoma Crocker.  -FMLA forms can be faxed to [phone] and please allow 5-7 business days for completion.   Blood Transfusion Information WHAT IS A BLOOD TRANSFUSION? A transfusion is the replacement of blood or some of its parts. Blood is  made up of multiple cells which provide different functions.  Red blood cells carry oxygen and are used for blood loss replacement.  White blood cells fight against infection.  Platelets control bleeding.  Plasma helps clot blood.  Other blood products are available for specialized needs, such as hemophilia or other clotting disorders. BEFORE THE TRANSFUSION  Who gives blood for transfusions?   You may be able to donate blood to be used at a later  date on yourself (autologous donation).  Relatives can be asked to donate blood. This is generally not any safer than if you have received blood from a stranger. The same precautions are taken to ensure safety when a relative's blood is donated.  Healthy volunteers who are fully evaluated to make sure their blood is safe. This is blood bank blood. Transfusion therapy is the safest it has ever been in the practice of medicine. Before blood is taken from a donor, a complete history is taken to make sure that person has no history of diseases nor engages in risky social behavior (examples are intravenous drug use or sexual activity with multiple partners). The donor's travel history is screened to minimize risk of transmitting infections, such as malaria. The donated blood is tested for signs of infectious diseases, such as HIV and hepatitis. The blood is then tested to be sure it is compatible with you in order to minimize the chance of a transfusion reaction. If you or a relative donates blood, this is often done in anticipation of surgery and is not appropriate for emergency situations. It takes many days to process the donated blood. RISKS AND COMPLICATIONS Although transfusion therapy is very safe and saves many lives, the main dangers of transfusion include:   Getting an infectious disease.  Developing a transfusion reaction. This is an allergic reaction to something in the blood you were given. Every precaution is taken to prevent this. The decision to have a blood transfusion has been considered carefully by your caregiver before blood is given. Blood is not given unless the benefits outweigh the risks.

## 2018-09-11 ENCOUNTER — Encounter: Payer: Self-pay | Admitting: Obstetrics

## 2018-09-24 NOTE — Progress Notes (Signed)
Cardiology Office Note:    Date:  09/25/2018   ID:  Kayla Carey, DOB [birth year], MRN [id number]  PCP:  Forrest Moron, MD  Cardiologist:  Shirlee More, MD    Referring MD: Forrest Moron, MD    ASSESSMENT:    1. Hypertensive heart disease with chronic diastolic congestive heart failure ([place])   2. Abnormal electrocardiogram (ECG) (EKG)    PLAN:    In order of problems listed above:  1. BP is not at target increase the dose of her diuretic and hydralazine.  Heart failure is compensated.  She relates showed lab work drawn 10/03/2018 preoperativel didy 2. Due to marked LVH she has had normal coronary to urography 3. Okay for dental extraction   Next appointment: 3 months   Medication Adjustments/Labs and Tests Ordered: Current medicines are reviewed at length with the patient today.  Concerns regarding medicines are outlined above.  No orders of the defined types were placed in this encounter.  No orders of the defined types were placed in this encounter.   Chief Complaint  Patient presents with  . Follow-up  . Hypertension  . Congestive Heart Failure    History of Present Illness:    Kayla Carey is a 47 y.o. female with a hx of hypertensive heart disease and heart failure last seen 08/11/18. She is pending GYN surgery 10/14/18.  Surgeon Role  Isabel Caprice, MD Primary  Procedure Laterality Anesthesia  XI ROBOTIC ASSISTED TOTAL HYSTERECTOMY WITH BILATERAL SALPINGECTOMY Bilateral General    Compliance with diet, lifestyle and medications: Yes  She was emotionally distressed awaiting surgery for a GYN malignancy.  No shortness of breath chest pain palpitation or syncope blood pressures above target she has edema we will increase the dose of her diuretic and hydralazine. Past Medical History:  Diagnosis Date  . Cervical cancer ([place])   . Chest pain    07-28-2018  per pt has seen a cardiologist, dr Donnella Bi, in past 3-4 months ago and had a stress  test , was told it was ok   . Enlarged heart 2014   diagnosed in [place]  . GERD (gastroesophageal reflux disease)    per pt just drinks water  . Hypertension   . IDA (iron deficiency anemia)   . Joint pain    last few days as of 08-19-18    Past Surgical History:  Procedure Laterality Date  . BREAST SURGERY Left age 4   Lumpectomy-benign  . CARDIAC CATHETERIZATION    . CERVICAL CONIZATION W/BX N/A 08/25/2018   Procedure: CONIZATION CERVIX WITH BIOPSY AND ENDOCERVICAL CURETTAGE;  Surgeon: Isabel Caprice, MD;  Location: [facility];  Service: Gynecology;  Laterality: N/A;  . CORONARY ANGIOPLASTY    . LEFT HEART CATH AND CORONARY ANGIOGRAPHY N/A 08/13/2018   Procedure: LEFT HEART CATH AND CORONARY ANGIOGRAPHY;  Surgeon: Troy Sine, MD;  Location: [facility];  Service: Cardiovascular;  Laterality: N/A;    Current Medications: Current Meds  Medication Sig  . albuterol (PROVENTIL HFA;VENTOLIN HFA) 108 (90 Base) MCG/ACT inhaler Inhale 1 puff into the lungs every 6 (six) hours as needed for wheezing or shortness of breath.   Marland Kitchen amLODipine (NORVASC) 5 MG tablet Take 5 mg by mouth daily.   . Ascorbic Acid (VITAMIN C) 1000 MG tablet Take 1,000 mg by mouth daily.   . Cholecalciferol (VITAMIN D3) 2000 units capsule Take 2,000 Units by mouth daily.  . ferrous sulfate 325 (65 FE) MG tablet Take  325 mg by mouth daily.   . furosemide (LASIX) 40 MG tablet Take 1 tablet (40 mg total) by mouth 2 (two) times daily.  . hydrALAZINE (APRESOLINE) 25 MG tablet Take 25 mg by mouth 3 (three) times daily.   Marland Kitchen ibuprofen (ADVIL,MOTRIN) 800 MG tablet Take 1 tablet (800 mg total) by mouth every 8 (eight) hours as needed.  Marland Kitchen losartan-hydrochlorothiazide (HYZAAR) 100-12.5 MG tablet Take 1 tablet by mouth every morning.   . metoprolol tartrate (LOPRESSOR) 25 MG tablet Take 25 mg by mouth every morning.   . potassium chloride (K-DUR) 10 MEQ tablet Take 10 mEq by mouth every morning.        Allergies:   Patient has no known allergies.   Social History   Socioeconomic History  . Marital status: Single    Spouse name: Not on file  . Number of children: Not on file  . Years of education: Not on file  . Highest education level: Not on file  Occupational History  . Not on file  Social Needs  . Financial resource strain: Not on file  . Food insecurity:    Worry: Not on file    Inability: Not on file  . Transportation needs:    Medical: Not on file    Non-medical: Not on file  Tobacco Use  . Smoking status: Former Smoker    Packs/day: 0.50    Years: 20.00    Pack years: 10.00    Types: Cigarettes    Last attempt to quit: 07/15/2018    Years since quitting: 0.1  . Smokeless tobacco: Never Used  Substance and Sexual Activity  . Alcohol use: Not Currently    Comment: none last 2 months  . Drug use: Never  . Sexual activity: Not Currently    Comment: 1st intercourse 47 yo-More than 5 partners  Lifestyle  . Physical activity:    Days per week: Not on file    Minutes per session: Not on file  . Stress: Not on file  Relationships  . Social connections:    Talks on phone: Not on file    Gets together: Not on file    Attends religious service: Not on file    Active member of club or organization: Not on file    Attends meetings of clubs or organizations: Not on file    Relationship status: Not on file  Other Topics Concern  . Not on file  Social History Narrative  . Not on file     Family History: The patient's family history includes Cancer in her father; Congestive Heart Failure in her mother; Diabetes in her father; Hypertension in her father and mother; Kidney cancer in her son. ROS:   Please see the history of present illness.    All other systems reviewed and are negative.  EKGs/Labs/Other Studies Reviewed:    The following studies were reviewed today  Recent Labs: 05/27/2018: ALT 16 08/01/2018: NT-Pro BNP 236; Platelets 233 08/11/2018: BUN 13;  Creatinine, Ser 0.65 08/25/2018: Hemoglobin 12.9; Potassium 3.6; Sodium 141  Recent Lipid Panel    Component Value Date/Time   CHOL 184 05/27/2018 1335   TRIG 54 05/27/2018 1335   HDL 48 05/27/2018 1335   CHOLHDL 3.8 05/27/2018 1335   LDLCALC 125 (H) 05/27/2018 1335    Physical Exam:    VS:  BP (!) 162/100 (BP Location: Right Arm, Patient Position: Sitting, Cuff Size: Large)   Pulse 86   Ht 5\' 7"  (1.702 m)  Wt 265 lb 8 oz (120.4 kg)   SpO2 97%   BMI 41.58 kg/m     Wt Readings from Last 3 Encounters:  09/25/18 265 lb 8 oz (120.4 kg)  09/10/18 260 lb 9.6 oz (118.2 kg)  08/25/18 256 lb (116.1 kg)     GEN:  Well nourished, well developed in no acute distress HEENT: Normal NECK: No JVD; No carotid bruits LYMPHATICS: No lymphadenopathy CARDIAC: RRR, no murmurs, rubs, gallops RESPIRATORY:  Clear to auscultation without rales, wheezing or rhonchi  ABDOMEN: Soft, non-tender, non-distended MUSCULOSKELETAL: 1-2+ to the knee bilateral periorbital edema; No deformity  SKIN: Warm and dry NEUROLOGIC:  Alert and oriented x 3 PSYCHIATRIC:  Normal affect    Signed, Shirlee More, MD  09/25/2018 3:08 PM    [place]

## 2018-09-25 ENCOUNTER — Encounter: Payer: Self-pay | Admitting: Cardiology

## 2018-09-25 ENCOUNTER — Encounter: Payer: Self-pay | Admitting: *Deleted

## 2018-09-25 ENCOUNTER — Ambulatory Visit (INDEPENDENT_AMBULATORY_CARE_PROVIDER_SITE_OTHER): Payer: 59 | Admitting: Cardiology

## 2018-09-25 VITALS — BP 162/100 | HR 86 | Ht 67.0 in | Wt 265.5 lb

## 2018-09-25 DIAGNOSIS — I5032 Chronic diastolic (congestive) heart failure: Secondary | ICD-10-CM | POA: Diagnosis not present

## 2018-09-25 DIAGNOSIS — I110 Hypertensive heart disease with heart failure: Secondary | ICD-10-CM | POA: Diagnosis not present

## 2018-09-25 DIAGNOSIS — R9431 Abnormal electrocardiogram [ECG] [EKG]: Secondary | ICD-10-CM | POA: Diagnosis not present

## 2018-09-25 MED ORDER — FUROSEMIDE 40 MG PO TABS: 40.0000 mg | ORAL_TABLET | Freq: Two times a day (BID) | ORAL | 1 refills | Status: DC

## 2018-09-25 MED ORDER — HYDRALAZINE HCL 25 MG PO TABS: 25.0000 mg | ORAL_TABLET | Freq: Three times a day (TID) | ORAL | 1 refills | Status: DC

## 2018-09-25 MED ORDER — HYDRALAZINE HCL 25 MG PO TABS: 37.5000 mg | ORAL_TABLET | Freq: Three times a day (TID) | ORAL | 1 refills | Status: DC

## 2018-09-25 NOTE — Addendum Note (Signed)
Addended by: Austin Miles on: 09/25/2018 03:29 PM   Modules accepted: Orders

## 2018-09-25 NOTE — Patient Instructions (Signed)
Medication Instructions:  Your physician has recommended you make the following change in your medication:   INCREASE furosemide (lasix) 40 mg: Take 1 tablet twice daily. Take an extra tablet (40 mg) on Monday, Wednesday, and Friday.   INCREASE hydralazine (apresoline) 25 mg: Take 1.5 tablets (37.5 mg) three times daily    If you need a refill on your cardiac medications before your next appointment, please call your pharmacy.   Lab work: None  If you have labs (blood work) drawn today and your tests are completely normal, you will receive your results only by: Marland Kitchen MyChart Message (if you have MyChart) OR . A paper copy in the mail If you have any lab test that is abnormal or we need to change your treatment, we will call you to review the results.  Testing/Procedures: None  Follow-Up: At [facility], you and your health needs are our priority.  As part of our continuing mission to provide you with exceptional heart care, we have created designated Provider Care Teams.  These Care Teams include your primary Cardiologist (physician) and Advanced Practice Providers (APPs -  Physician Assistants and Nurse Practitioners) who all work together to provide you with the care you need, when you need it. You will need a follow up appointment in 3 months.  Please call our office 2 months in advance to schedule this appointment.

## 2018-10-02 ENCOUNTER — Encounter (HOSPITAL_COMMUNITY): Payer: Self-pay

## 2018-10-02 NOTE — Patient Instructions (Addendum)
Kayla Carey  1971/08/18    Your procedure is scheduled on:  10-14-2018   Report to [facility] Main  Entrance,  Report to admitting at  9:45 AM    Call this number if you have problems the morning of surgery [phone]       Eat a light diet the day before surgery.  Examples including soups, broths, toast, yogurt, mashed potatoes.  Things to avoid include carbonated beverages (fizzy beverages), raw fruits and raw vegetables, or beans.   If your bowels are filled with gas, your surgeon will have difficulty visualizing your pelvic organs which increases your surgical risks.    REMEMBER:  NO SOLID FOOD AFTER MIDNIGHT THE NIGHT PRIOR TO SURGERY. NOTHING BY MOUTH EXCEPT CLEAR LIQUIDS UNTIL 3 HOURS PRIOR TO [place] SURGERY. PLEASE FINISH ENSURE DRINK PER SURGEON ORDER 3 HOURS PRIOR TO SCHEDULED SURGERY TIME WHICH NEEDS TO BE COMPLETED AT _8:45 AM__.   AFTER 8:45 AM NOTHING BY MOUTH INCLUDING WATER, CANDY, GUM, MINTS.   BRUSH YOUR TEETH MORNING OF SURGERY AND RINSE YOUR MOUTH OUT.         Take these medicines the morning of surgery with A SIP OF WATER:    Hydralazine,  Metoprolol,  Albuterol inhaler if needed and bring with you day of surgery                                   You may not have any metal on your body including hair pins and               piercings  Do not wear jewelry, make-up, lotions, powders or perfumes, deodorant              Do not wear nail polish.  Do not shave  48 hours prior to surgery.                 Do not bring valuables to the hospital. [place].  Contacts, dentures or bridgework may not be worn into surgery.  Leave suitcase in the car. After surgery it may be brought to your room.     _____________________________________________________________________      CLEAR LIQUID DIET   Foods Allowed                                                                      Foods Excluded  Coffee and tea, regular and decaf                             liquids that you cannot  Plain Jell-O in any flavor                                             see through such as: Fruit ices (not with fruit pulp)  milk, soups, orange juice  Iced Popsicles                                    All solid food                                    Cranberry, grape and apple juices Sports drinks like Gatorade Lightly seasoned clear broth or consume(fat free) Sugar, honey syrup  Sample Menu Breakfast                                Lunch                                     Supper Cranberry juice                    Beef broth                            Chicken broth Jell-O                                     Grape juice                           Apple juice Coffee or tea                        Jell-O                                      Popsicle                                                Coffee or tea                        Coffee or tea  _____________________________________________________________________            [facility] - Preparing for Surgery Before surgery, you can play an important role.  Because skin is not sterile, your skin needs to be as free of germs as possible.  You can reduce the number of germs on your skin by washing with CHG (chlorahexidine gluconate) soap before surgery.  CHG is an antiseptic cleaner which kills germs and bonds with the skin to continue killing germs even after washing. Please DO NOT use if you have an allergy to CHG or antibacterial soaps.  If your skin becomes reddened/irritated stop using the CHG and inform your nurse when you arrive at Short Stay. Do not shave (including legs and underarms) for at least 48 hours prior to the first CHG shower.  You may shave your face/neck. Please follow these instructions carefully:  1.  Shower with CHG Soap the night before surgery and the  morning of  Surgery.  2.  If you choose to wash your hair, wash your hair first as usual with your  normal  shampoo.  3.  After you shampoo, rinse your hair and body thoroughly to remove the  shampoo.                            4.  Use CHG as you would any other liquid soap.  You can apply chg directly  to the skin and wash                       Gently with a scrungie or clean washcloth.  5.  Apply the CHG Soap to your body ONLY FROM THE NECK DOWN.   Do not use on face/ open                           Wound or open sores. Avoid contact with eyes, ears mouth and genitals (private parts).                       Wash face,  Genitals (private parts) with your normal soap.             6.  Wash thoroughly, paying special attention to the area where your surgery  will be performed.  7.  Thoroughly rinse your body with warm water from the neck down.  8.  DO NOT shower/wash with your normal soap after using and rinsing off  the CHG Soap.             9.  Pat yourself dry with a clean towel.            10.  Wear clean pajamas.            11.  Place clean sheets on your bed the night of your first shower and do not  sleep with pets. Day of Surgery : Do not apply any lotions/deodorants the morning of surgery.  Please wear clean clothes to the hospital/surgery center.  FAILURE TO FOLLOW THESE INSTRUCTIONS MAY RESULT IN THE CANCELLATION OF YOUR SURGERY PATIENT SIGNATURE_________________________________  NURSE SIGNATURE__________________________________  ________________________________________________________________________   Adam Phenix  An incentive spirometer is a tool that can help keep your lungs clear and active. This tool measures how well you are filling your lungs with each breath. Taking long deep breaths may help reverse or decrease the chance of developing breathing (pulmonary) problems (especially infection) following:  A long period of time when you are unable to move or be active. BEFORE  THE PROCEDURE   If the spirometer includes an indicator to show your best effort, your nurse or respiratory therapist will set it to a desired goal.  If possible, sit up straight or lean slightly forward. Try not to slouch.  Hold the incentive spirometer in an upright position. INSTRUCTIONS FOR USE  1. Sit on the edge of your bed if possible, or sit up as far as you can in bed or on a chair. 2. Hold the incentive spirometer in an upright position. 3. Breathe out normally. 4. Place the mouthpiece in your mouth and seal your lips tightly around it. 5. Breathe in slowly and as deeply as possible, raising the piston or the ball toward the top of the column. 6. Hold your breath for 3-5 seconds or for as long as possible. Allow the piston or ball to fall to  the bottom of the column. 7. Remove the mouthpiece from your mouth and breathe out normally. 8. Rest for a few seconds and repeat Steps 1 through 7 at least 10 times every 1-2 hours when you are awake. Take your time and take a few normal breaths between deep breaths. 9. The spirometer may include an indicator to show your best effort. Use the indicator as a goal to work toward during each repetition. 10. After each set of 10 deep breaths, practice coughing to be sure your lungs are clear. If you have an incision (the cut made at the time of surgery), support your incision when coughing by placing a pillow or rolled up towels firmly against it. Once you are able to get out of bed, walk around indoors and cough well. You may stop using the incentive spirometer when instructed by your caregiver.  RISKS AND COMPLICATIONS  Take your time so you do not get dizzy or light-headed.  If you are in pain, you may need to take or ask for pain medication before doing incentive spirometry. It is harder to take a deep breath if you are having pain. AFTER USE  Rest and breathe slowly and easily.  It can be helpful to keep track of a log of your progress.  Your caregiver can provide you with a simple table to help with this. If you are using the spirometer at home, follow these instructions: [place] IF:   You are having difficultly using the spirometer.  You have trouble using the spirometer as often as instructed.  Your pain medication is not giving enough relief while using the spirometer.  You develop fever of 100.5 F (38.1 C) or higher. SEEK IMMEDIATE MEDICAL CARE IF:   You cough up bloody sputum that had not been present before.  You develop fever of 102 F (38.9 C) or greater.  You develop worsening pain at or near the incision site. MAKE SURE YOU:   Understand these instructions.  Will watch your condition.  Will get help right away if you are not doing well or get worse. Document Released: 02/11/2007 Document Revised: 12/24/2011 Document Reviewed: 04/14/2007 ExitCare Patient Information 2014 Exit[employer], [place].   ________________________________________________________________________  WHAT IS A BLOOD TRANSFUSION? Blood Transfusion Information  A transfusion is the replacement of blood or some of its parts. Blood is made up of multiple cells which provide different functions.  Red blood cells carry oxygen and are used for blood loss replacement.  White blood cells fight against infection.  Platelets control bleeding.  Plasma helps clot blood.  Other blood products are available for specialized needs, such as hemophilia or other clotting disorders. BEFORE THE TRANSFUSION  Who gives blood for transfusions?   Healthy volunteers who are fully evaluated to make sure their blood is safe. This is blood bank blood. Transfusion therapy is the safest it has ever been in the practice of medicine. Before blood is taken from a donor, a complete history is taken to make sure that person has no history of diseases nor engages in risky social behavior (examples are intravenous drug use or sexual activity with multiple  partners). The donor's travel history is screened to minimize risk of transmitting infections, such as malaria. The donated blood is tested for signs of infectious diseases, such as HIV and hepatitis. The blood is then tested to be sure it is compatible with you in order to minimize the chance of a transfusion reaction. If you or a relative donates blood,  this is often done in anticipation of surgery and is not appropriate for emergency situations. It takes many days to process the donated blood. RISKS AND COMPLICATIONS Although transfusion therapy is very safe and saves many lives, the main dangers of transfusion include:   Getting an infectious disease.  Developing a transfusion reaction. This is an allergic reaction to something in the blood you were given. Every precaution is taken to prevent this. The decision to have a blood transfusion has been considered carefully by your caregiver before blood is given. Blood is not given unless the benefits outweigh the risks. AFTER THE TRANSFUSION  Right after receiving a blood transfusion, you will usually feel much better and more energetic. This is especially true if your red blood cells have gotten low (anemic). The transfusion raises the level of the red blood cells which carry oxygen, and this usually causes an energy increase.  The nurse administering the transfusion will monitor you carefully for complications. HOME CARE INSTRUCTIONS  No special instructions are needed after a transfusion. You may find your energy is better. Speak with your caregiver about any limitations on activity for underlying diseases you may have. SEEK MEDICAL CARE IF:   Your condition is not improving after your transfusion.  You develop redness or irritation at the intravenous (IV) site. SEEK IMMEDIATE MEDICAL CARE IF:  Any of the following symptoms occur over the next 12 hours:  Shaking chills.  You have a temperature by mouth above 102 F (38.9 C), not  controlled by medicine.  Chest, back, or muscle pain.  People around you feel you are not acting correctly or are confused.  Shortness of breath or difficulty breathing.  Dizziness and fainting.  You get a rash or develop hives.  You have a decrease in urine output.  Your urine turns a dark color or changes to pink, red, or brown. Any of the following symptoms occur over the next 10 days:  You have a temperature by mouth above 102 F (38.9 C), not controlled by medicine.  Shortness of breath.  Weakness after normal activity.  The white part of the eye turns yellow (jaundice).  You have a decrease in the amount of urine or are urinating less often.  Your urine turns a dark color or changes to pink, red, or brown. Document Released: 09/28/2000 Document Revised: 12/24/2011 Document Reviewed: 05/17/2008 [facility] Patient Information 2014 [place], [place].  _______________________________________________________________________

## 2018-10-03 ENCOUNTER — Inpatient Hospital Stay: Payer: 59 | Attending: Obstetrics | Admitting: Obstetrics

## 2018-10-03 ENCOUNTER — Encounter: Payer: Self-pay | Admitting: Obstetrics

## 2018-10-03 ENCOUNTER — Other Ambulatory Visit: Payer: Self-pay

## 2018-10-03 ENCOUNTER — Encounter (HOSPITAL_COMMUNITY)
Admission: RE | Admit: 2018-10-03 | Discharge: 2018-10-03 | Disposition: A | Discharge status: Home / Self Care | Payer: 59 | Source: Ambulatory Visit | Attending: Obstetrics | Admitting: Obstetrics

## 2018-10-03 ENCOUNTER — Encounter (HOSPITAL_COMMUNITY): Payer: Self-pay

## 2018-10-03 VITALS — BP 149/85 | HR 76 | Temp 97.6°F | Resp 20 | Ht 67.0 in | Wt 265.4 lb

## 2018-10-03 DIAGNOSIS — C539 Malignant neoplasm of cervix uteri, unspecified: Secondary | ICD-10-CM | POA: Insufficient documentation

## 2018-10-03 DIAGNOSIS — Z01818 Encounter for other preprocedural examination: Secondary | ICD-10-CM | POA: Insufficient documentation

## 2018-10-03 DIAGNOSIS — Z9889 Other specified postprocedural states: Secondary | ICD-10-CM | POA: Diagnosis not present

## 2018-10-03 HISTORY — DX: Chronic diastolic (congestive) heart failure: I50.32

## 2018-10-03 LAB — URINALYSIS, ROUTINE W REFLEX MICROSCOPIC
Bilirubin Urine: NEGATIVE
Glucose, UA: NEGATIVE mg/dL
Hgb urine dipstick: NEGATIVE
Ketones, ur: NEGATIVE mg/dL
Leukocytes, UA: NEGATIVE
Nitrite: NEGATIVE
Protein, ur: NEGATIVE mg/dL
Specific Gravity, Urine: 1.013 (ref 1.005–1.030)
pH: 8 (ref 5.0–8.0)

## 2018-10-03 LAB — CBC
HEMATOCRIT: 40.8 % (ref 36.0–46.0)
Hemoglobin: 13.4 g/dL (ref 12.0–15.0)
MCH: 31.5 pg (ref 26.0–34.0)
MCHC: 32.8 g/dL (ref 30.0–36.0)
MCV: 96 fL (ref 80.0–100.0)
Platelets: 230 10*3/uL (ref 150–400)
RBC: 4.25 MIL/uL (ref 3.87–5.11)
RDW: 13 % (ref 11.5–15.5)
WBC: 3.8 10*3/uL — ABNORMAL LOW (ref 4.0–10.5)
nRBC: 0 % (ref 0.0–0.2)

## 2018-10-03 LAB — COMPREHENSIVE METABOLIC PANEL
ALT: 23 U/L (ref 0–44)
AST: 29 U/L (ref 15–41)
Albumin: 4.5 g/dL (ref 3.5–5.0)
Alkaline Phosphatase: 55 U/L (ref 38–126)
Anion gap: 10 (ref 5–15)
BILIRUBIN TOTAL: 0.6 mg/dL (ref 0.3–1.2)
BUN: 14 mg/dL (ref 6–20)
CO2: 30 mmol/L (ref 22–32)
Calcium: 9.7 mg/dL (ref 8.9–10.3)
Chloride: 101 mmol/L (ref 98–111)
Creatinine, Ser: 0.77 mg/dL (ref 0.44–1.00)
GFR calc Af Amer: >60 mL/min (ref >60)
GFR calc non Af Amer: >60 mL/min (ref >60)
Glucose, Bld: 94 mg/dL (ref 70–99)
Potassium: 3.7 mmol/L (ref 3.5–5.1)
Sodium: 141 mmol/L (ref 135–145)
Total Protein: 7.2 g/dL (ref 6.5–8.1)

## 2018-10-03 NOTE — Progress Notes (Signed)
EKG dated 08-01-2018 in epic.  ECHO dated 08-05-2018 in epic.  Nuclear stress test dated 08-06-2018 in epic.  Cardiac cath results dated 08-13-2018 in epic.  CXR dated 08-11-2018 in epic.  Pt cardiologist, dr Bettina Gavia, [place] note dated 09-25-2018 in epic.

## 2018-10-03 NOTE — Patient Instructions (Signed)
Preparing for your Surgery  Plan for surgery on October 14, 2018 with Dr. Precious Haws at [place] will be scheduled for a robotic assisted total hysterectomy, bilateral salpingectomy, and any other indicated procedures.   Pre-operative Testing -You will receive a phone call from presurgical testing at [facility] to arrange for a pre-operative testing appointment before your surgery.  This appointment normally occurs one to two weeks before your scheduled surgery.   -Bring your insurance card, copy of an advanced directive if applicable, medication list  -At that visit, you will be asked to sign a consent for a possible blood transfusion in case a transfusion becomes necessary during surgery.  The need for a blood transfusion is rare but having consent is a necessary part of your care.     -You should not be taking blood thinners or aspirin at least ten days prior to surgery unless instructed by your surgeon.  Day Before Surgery at [place] will be asked to take in a light diet the day before surgery.  Avoid carbonated beverages.  You will be advised to have nothing to eat or drink after midnight the evening before.    Eat a light diet the day before surgery.  Examples including soups, broths, toast, yogurt, mashed potatoes.  Things to avoid include carbonated beverages (fizzy beverages), raw fruits and raw vegetables, or beans.   If your bowels are filled with gas, your surgeon will have difficulty visualizing your pelvic organs which increases your surgical risks.  Your role in recovery Your role is to become active as soon as directed by your doctor, while still giving yourself time to heal.  Rest when you feel tired. You will be asked to do the following in order to speed your recovery:  - Cough and breathe deeply. This helps toclear and expand your lungs and can prevent pneumonia. You may be given a spirometer to practice deep breathing. A staff  member will show you how to use the spirometer. - Do mild physical activity. Walking or moving your legs help your circulation and body functions return to normal. A staff member will help you when you try to walk and will provide you with simple exercises. Do not try to get up or walk alone the first time. - Actively manage your pain. Managing your pain lets you move in comfort. We will ask you to rate your pain on a scale of zero to 10. It is your responsibility to tell your doctor or nurse where and how much you hurt so your pain can be treated.  Special Considerations -If you are diabetic, you may be placed on insulin after surgery to have closer control over your blood sugars to promote healing and recovery.  This does not mean that you will be discharged on insulin.  If applicable, your oral antidiabetics will be resumed when you are tolerating a solid diet.  -Your final pathology results from surgery should be available around one week after surgery and the results will be relayed to you when available.  -Dr. Lahoma Crocker is the Surgeon that assists your GYN Oncologist with surgery.  The next day after your surgery you will either see your GYN Oncologist, Dr. Everitt Amber, or Dr. Lahoma Crocker.  -FMLA forms can be faxed to [phone] and please allow 5-7 business days for completion.   Blood Transfusion Information WHAT IS A BLOOD TRANSFUSION? A transfusion is the replacement of blood or some of its parts. Blood is  made up of multiple cells which provide different functions.  Red blood cells carry oxygen and are used for blood loss replacement.  White blood cells fight against infection.  Platelets control bleeding.  Plasma helps clot blood.  Other blood products are available for specialized needs, such as hemophilia or other clotting disorders. BEFORE THE TRANSFUSION  Who gives blood for transfusions?   You may be able to donate blood to be used at a later date on  yourself (autologous donation).  Relatives can be asked to donate blood. This is generally not any safer than if you have received blood from a stranger. The same precautions are taken to ensure safety when a relative's blood is donated.  Healthy volunteers who are fully evaluated to make sure their blood is safe. This is blood bank blood. Transfusion therapy is the safest it has ever been in the practice of medicine. Before blood is taken from a donor, a complete history is taken to make sure that person has no history of diseases nor engages in risky social behavior (examples are intravenous drug use or sexual activity with multiple partners). The donor's travel history is screened to minimize risk of transmitting infections, such as malaria. The donated blood is tested for signs of infectious diseases, such as HIV and hepatitis. The blood is then tested to be sure it is compatible with you in order to minimize the chance of a transfusion reaction. If you or a relative donates blood, this is often done in anticipation of surgery and is not appropriate for emergency situations. It takes many days to process the donated blood. RISKS AND COMPLICATIONS Although transfusion therapy is very safe and saves many lives, the main dangers of transfusion include:   Getting an infectious disease.  Developing a transfusion reaction. This is an allergic reaction to something in the blood you were given. Every precaution is taken to prevent this. The decision to have a blood transfusion has been considered carefully by your caregiver before blood is given. Blood is not given unless the benefits outweigh the risks.

## 2018-10-04 ENCOUNTER — Encounter: Payer: Self-pay | Admitting: Obstetrics

## 2018-10-04 LAB — ABO/RH: ABO/RH(D): O POS

## 2018-10-04 LAB — TYPE AND SCREEN
ABO/RH(D): O POS
Antibody Screen: NEGATIVE

## 2018-10-04 NOTE — Progress Notes (Signed)
[place] at [facility]   Progress Note: Established Patient Follow-Up VISIT   Consult was originally requested by Dr. Donalynn Furlong for a cervical biopsy showing SCCa   Chief Complaint  Patient presents with  . Malignant neoplasm of cervix, unspecified site [facility])    GYN Oncologic Summary 1. 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  is a very nice 47 y.o.  P3   Interval History  I am seeing her to check the cone bed/pelvic to confirm healing prior to hysterectomy. We are also discussing hysterectomy in detail today.   Oncologic History She was seen for routine exam after living in [place] and receiving most of her care there. She had a screening Pap by her PCP. 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 She was referred to Gynecology for further workup.  07/15/18 Colposcopy with biopsies revealed: 1. 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 -NO INVASIVE NEOPLASM IDENTIFIED. -SEE COMMENT. 2. Cervix, biopsy, 3 o'clock -CERVICAL TRANSFORMATION ZONE MUCOSA WITH CIN-III (SEVERE SQUAMOUS DYSPLASIA/SQUAMOUS CARCINOMA IN SITU; HIGH GRADE SQUAMOUS INTRAEPITHELIAL LESION). -NO INVASIVE NEOPLASM IDENTIFIED. -SEE COMMENT. 3. Cervix, biopsy, 6 o'clock -FRAGMENTED SQUAMOUS MUCOSA WITH CIN-III (SEVERE SQUAMOUS DYSPLASIA/SQUAMOUS CARCINOMA IN SITU; HIGH GRADE SQUAMOUS INTRAEPITHELIAL LESION). -CANNOT RULE OUT HIGHER GRADE LESION. 4. Cervix, biopsy, 9 o'clock -FOCI OF SUBMUCOSAL INVASIVE SQUAMOUS CELL CARCINOMA IN A SETTING OF FRAGMENTED CERVICAL TRANSFORMATION ZONE MUCOSA WITH CIN-III (SEVERE SQUAMOUS DYSPLASIA/SQUAMOUS CARCINOMA IN SITU; HIGH GRADE SQUAMOUS INTRAEPITHELIAL LESION). -SEE COMMENT. The portion of the endocervical stroma containing invasive squamous cell carcinoma is  heavily inflamed, unoriented and devoid of surface mucosa. In this setting, the depth of invasion cannot be accurately determined. 5. Endocervix, curettage - FRAGMENTED CERVICAL TRANSFORMATION ZONE MUCOSA WITH CIN-III (SEVERE SQUAMOUS DYSPLASIA/SQUAMOUS CARCINOMA IN SITU; HIGH GRADE SQUAMOUS INTRAEPITHELIAL LESION). - CERVICAL STROMA NOT PRESENT FOR EVALUATION. - SEE COMMENT.  Patient denied any post-coital bleeding, but it is hard to get a history whether she has had intercourse in the last 6 months. Her menses have been spacing out and she has noted no increased vaginal bleeding. She has noticed some spotting since the biopsies done a few days ago. Occasional sharp right sided pain but it is fleeting. Denies N/V.  On 08/25/18 I took her for a cervical conization. Final pathology revealed: 1. Cervix, cone - INVASIVE SQUAMOUS CELL CARCINOMA, WELL-DIFFERENTIATED. - TUMOR SPANS 3 MM IN WIDTH, 2 MM IN LENGTH, AND 2 MM IN DEPTH. - BACKGROUND HIGH GRADE SQUAMOUS INTRAEPITHELIAL LESION, CIN-III (SEVERE DYSPLASIA/CIS) WITH ENDOCERVICAL GLAND INVOLVEMENT. - MARGINS ARE NEGATIVE FOR INVASIVE CARCINOMA. - CIN-III EXTENDS TO ENDOCERVICAL MARGIN BROADLY. - SEE COMMENT. 2. Endocervix, curettage - DETACHED FRAGMENTS OF AT LEAST HIGH GRADE SQUAMOUS INTRAEPITHELIAL LESION, CIN-III (SEVERE DYSPLASIA/CIS). - SEE COMMENT. Microscopic Comment 1. The invasive carcinoma is located at roughly 10:00 position and is 2 mm from the ectocervical margin. CIN-III is present at the endocervical margin from roughly 12:00-3:00. An oncology table is not required for cervical cone excisions. 2. Fragments of at least CIN-III are detached and not associated with stroma, thus invasion can not be assessed. 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  Imported EPIC Oncologic History:   No history exists.    Measurement of  disease: Will be clinical exam and Pap given her  low Stage disease . Marland Kitchen  Radiology: Dg Chest 2 View  Result Date: 08/12/2018 CLINICAL DATA:  Preoperative evaluation. History of cervical carcinoma. Hypertension. EXAM: CHEST - 2 VIEW COMPARISON:  December 25, 2017 FINDINGS: Lungs are clear. Heart is enlarged with pulmonary vascularity normal. No adenopathy. No bone lesions. IMPRESSION: There is cardiomegaly.  No edema or consolidation.  No adenopathy. Electronically Signed   By: Lowella Grip III M.D.   On: 08/12/2018 08:43    Outpatient Encounter Medications as of 10/03/2018  Medication Sig  . albuterol (PROVENTIL HFA;VENTOLIN HFA) 108 (90 Base) MCG/ACT inhaler Inhale 1 puff into the lungs every 6 (six) hours as needed for wheezing or shortness of breath.   Marland Kitchen amLODipine (NORVASC) 5 MG tablet Take 5 mg by mouth every evening.   . Ascorbic Acid (VITAMIN C) 1000 MG tablet Take 1,000 mg by mouth daily.   . Cholecalciferol (VITAMIN D3) 2000 units capsule Take 2,000 Units by mouth daily.  . ferrous sulfate 325 (65 FE) MG tablet Take 325 mg by mouth daily.   . furosemide (LASIX) 40 MG tablet Take 1 tablet (40 mg total) by mouth 2 (two) times daily. Take 1 extra tablet on Monday, Wednesday, Friday.  . hydrALAZINE (APRESOLINE) 25 MG tablet Take 1.5 tablets (37.5 mg total) by mouth 3 (three) times daily.  Marland Kitchen ibuprofen (ADVIL,MOTRIN) 800 MG tablet Take 1 tablet (800 mg total) by mouth every 8 (eight) hours as needed. (Patient taking differently: Take 800 mg by mouth every 8 (eight) hours as needed (PAIN). )  . losartan-hydrochlorothiazide (HYZAAR) 100-12.5 MG tablet Take 1 tablet by mouth every morning.   . metoprolol tartrate (LOPRESSOR) 25 MG tablet Take 25 mg by mouth every morning.   . potassium chloride (K-DUR) 10 MEQ tablet Take 10 mEq by mouth every morning.    No facility-administered encounter medications on file as of 10/03/2018.    No Known Allergies  Past Medical History:  Diagnosis Date   . Cervical cancer [facility])    gyn oncologist-  dr Gerarda Fraction--  bx 08-25-2018  ,  invasive SCC, Stage IA1  . Chronic diastolic (congestive) heart failure [facility])    cardiologist-  dr Bettina Gavia  . GERD (gastroesophageal reflux disease)    per pt just drinks water  . Hypertension   . IDA (iron deficiency anemia)    Past Surgical History:  Procedure Laterality Date  . BREAST SURGERY Left age 70   Lumpectomy-benign  . CERVICAL CONIZATION W/BX N/A 08/25/2018   Procedure: CONIZATION CERVIX WITH BIOPSY AND ENDOCERVICAL CURETTAGE;  Surgeon: Isabel Caprice, MD;  Location: [facility];  Service: Gynecology;  Laterality: N/A;  . LEFT HEART CATH AND CORONARY ANGIOGRAPHY N/A 08/13/2018   Procedure: LEFT HEART CATH AND CORONARY ANGIOGRAPHY;  Surgeon: Troy Sine, MD;  Location: [facility];  Service: Cardiovascular;  Laterality: N/A;        Past Gynecological History:   GYNECOLOGIC HISTORY:  . No LMP recorded. (Menstrual status: Irregular Periods).  . Menarche: 47 years old . P 3 . Contraceptive None . HRT NA  . Last Pap ?never before workup Family Hx:  Family History  Problem Relation Age of Onset  . Hypertension Mother   . Congestive Heart Failure Mother   . Hypertension Father   . Diabetes Father   . Cancer Father        Prostate  . Kidney cancer Son        son died from disease   Social Hx:  .  Tobacco use: quit ~2 weeks ago . Alcohol use: 1 drink per day . Illicit Drug use: none . Illicit IV Drug use: none    Review of Systems: Review of Systems  Constitutional: Positive for fatigue.  Cardiovascular: Positive for chest pain.  All other systems reviewed and are negative. chest pain has been worked up.   Vitals:   Vitals:   10/03/18 1644  Weight: 265 lb 6.4 oz (120.4 kg)  Height: 5\' 7"  (1.702 m)   Body mass index is 41.57 kg/m.  Physical Exam: General :  Well developed, 47 y.o., female in no apparent distress HEENT:  Normocephalic/atraumatic,  symmetric, EOMI, eyelids normal Neck:   Supple, no masses.  Lymphatics:  No cervical/ submandibular/ supraclavicular/ infraclavicular/ inguinal adenopathy Respiratory:  Respirations unlabored, no use of accessory muscles CV:   Deferred Breast:  Deferred Musculoskeletal: No CVA tenderness, normal muscle strength. Abdomen:  Soft, non-tender and nondistended. No evidence of hernia. No masses. Extremities:  No lymphedema, no erythema, non-tender. Skin:   Normal inspection Neuro/Psych:  No focal motor deficit, no abnormal mental status. Normal gait. Normal affect. Alert and oriented to person, place, and time  Genito Urinary: Vulva: Normal external female genitalia.  Bladder/urethra: Urethral meatus normal in size and location. No lesions or   masses, well supported bladder Speculum exam: Vagina: No lesion, no discharge, no bleeding. Cervix: postcone with the posterior lip fused to the posterior vagina. No gross lesions Bimanual exam: Confirms above with the posterior fornix somewhat obliterated. Uterus: Normal size, mobile.  Adnexal region: No masses. Rectovaginal:  Good tone, no masses, no cul de sac nodularity, no parametrial involvement or nodularity.    Assessment  Stage IA1 SCCa Cervix ECOG PERFORMANCE STATUS: 0 - Asymptomatic  Plan  1. Surgical pathology was reviewed last visit and she understands this is cancer, but luckily seems to have been caught early. ? She was given a copy of the report  2. Management ? Given this is Stage IA1 with no LVSI, per NCCN guidelines she is a candidate for a simple hysterectomy ? She is 47 yo and does not desire fertility, however, given the low risk of ovarian metastases I discouraged ovarian removal given her age<50. She agrees to retention of the ovaries. ? I did offer bilateral salpingectomy and she would like to proceed. 3. Surgical discussion ? We reviewed the risks, benefits, and alternatives using the surgical sketch today. She was given  a copy of this. ? Plan is for RA-TLH/bilateral salpingectomy ? We specifically addressed the risk posteriorly of injury to the rectum/fistula given the exam shows her posterior cervical lip is fused to the posterior vagina. 4. We have not yet addressed the infectious nature of cervical cancer and indication for STD testing. This will need to happen at future visits ideally when she has no family member in the room.  15  minutes of direct face to face counseling time was spent with the patient. This included discussion about prognosis, therapy recommendations including additional surgery, and postoperative side effects that are beyond the scope of routine postoperative care.   Isabel Caprice, MD  10/04/2018, 7:19 AM    Cc: Donalynn Furlong, MD (Referring Ob/Gyn) - not 10/03/18 Forrest Moron, MD (PCP)

## 2018-10-04 NOTE — H&P (View-Only) (Signed)
[place] at [facility]   Progress Note: Established Patient Follow-Up VISIT   Consult was originally requested by Dr. Donalynn Furlong for a cervical biopsy showing SCCa   Chief Complaint  Patient presents with  . Malignant neoplasm of cervix, unspecified site [facility])    GYN Oncologic Summary 1. 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  is a very nice 47 y.o.  P3   Interval History  I am seeing her to check the cone bed/pelvic to confirm healing prior to hysterectomy. We are also discussing hysterectomy in detail today.   Oncologic History She was seen for routine exam after living in [place] and receiving most of her care there. She had a screening Pap by her PCP. 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 She was referred to Gynecology for further workup.  07/15/18 Colposcopy with biopsies revealed: 1. 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 -NO INVASIVE NEOPLASM IDENTIFIED. -SEE COMMENT. 2. Cervix, biopsy, 3 o'clock -CERVICAL TRANSFORMATION ZONE MUCOSA WITH CIN-III (SEVERE SQUAMOUS DYSPLASIA/SQUAMOUS CARCINOMA IN SITU; HIGH GRADE SQUAMOUS INTRAEPITHELIAL LESION). -NO INVASIVE NEOPLASM IDENTIFIED. -SEE COMMENT. 3. Cervix, biopsy, 6 o'clock -FRAGMENTED SQUAMOUS MUCOSA WITH CIN-III (SEVERE SQUAMOUS DYSPLASIA/SQUAMOUS CARCINOMA IN SITU; HIGH GRADE SQUAMOUS INTRAEPITHELIAL LESION). -CANNOT RULE OUT HIGHER GRADE LESION. 4. Cervix, biopsy, 9 o'clock -FOCI OF SUBMUCOSAL INVASIVE SQUAMOUS CELL CARCINOMA IN A SETTING OF FRAGMENTED CERVICAL TRANSFORMATION ZONE MUCOSA WITH CIN-III (SEVERE SQUAMOUS DYSPLASIA/SQUAMOUS CARCINOMA IN SITU; HIGH GRADE SQUAMOUS INTRAEPITHELIAL LESION). -SEE COMMENT. The portion of the endocervical stroma containing invasive squamous cell carcinoma is  heavily inflamed, unoriented and devoid of surface mucosa. In this setting, the depth of invasion cannot be accurately determined. 5. Endocervix, curettage - FRAGMENTED CERVICAL TRANSFORMATION ZONE MUCOSA WITH CIN-III (SEVERE SQUAMOUS DYSPLASIA/SQUAMOUS CARCINOMA IN SITU; HIGH GRADE SQUAMOUS INTRAEPITHELIAL LESION). - CERVICAL STROMA NOT PRESENT FOR EVALUATION. - SEE COMMENT.  Patient denied any post-coital bleeding, but it is hard to get a history whether she has had intercourse in the last 6 months. Her menses have been spacing out and she has noted no increased vaginal bleeding. She has noticed some spotting since the biopsies done a few days ago. Occasional sharp right sided pain but it is fleeting. Denies N/V.  On 08/25/18 I took her for a cervical conization. Final pathology revealed: 1. Cervix, cone - INVASIVE SQUAMOUS CELL CARCINOMA, WELL-DIFFERENTIATED. - TUMOR SPANS 3 MM IN WIDTH, 2 MM IN LENGTH, AND 2 MM IN DEPTH. - BACKGROUND HIGH GRADE SQUAMOUS INTRAEPITHELIAL LESION, CIN-III (SEVERE DYSPLASIA/CIS) WITH ENDOCERVICAL GLAND INVOLVEMENT. - MARGINS ARE NEGATIVE FOR INVASIVE CARCINOMA. - CIN-III EXTENDS TO ENDOCERVICAL MARGIN BROADLY. - SEE COMMENT. 2. Endocervix, curettage - DETACHED FRAGMENTS OF AT LEAST HIGH GRADE SQUAMOUS INTRAEPITHELIAL LESION, CIN-III (SEVERE DYSPLASIA/CIS). - SEE COMMENT. Microscopic Comment 1. The invasive carcinoma is located at roughly 10:00 position and is 2 mm from the ectocervical margin. CIN-III is present at the endocervical margin from roughly 12:00-3:00. An oncology table is not required for cervical cone excisions. 2. Fragments of at least CIN-III are detached and not associated with stroma, thus invasion can not be assessed. 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  Imported EPIC Oncologic History:   No history exists.    Measurement of  disease: Will be clinical exam and Pap given her  low Stage disease . Marland Kitchen  Radiology: Dg Chest 2 View  Result Date: 08/12/2018 CLINICAL DATA:  Preoperative evaluation. History of cervical carcinoma. Hypertension. EXAM: CHEST - 2 VIEW COMPARISON:  December 25, 2017 FINDINGS: Lungs are clear. Heart is enlarged with pulmonary vascularity normal. No adenopathy. No bone lesions. IMPRESSION: There is cardiomegaly.  No edema or consolidation.  No adenopathy. Electronically Signed   By: Lowella Grip III M.D.   On: 08/12/2018 08:43    Outpatient Encounter Medications as of 10/03/2018  Medication Sig  . albuterol (PROVENTIL HFA;VENTOLIN HFA) 108 (90 Base) MCG/ACT inhaler Inhale 1 puff into the lungs every 6 (six) hours as needed for wheezing or shortness of breath.   Marland Kitchen amLODipine (NORVASC) 5 MG tablet Take 5 mg by mouth every evening.   . Ascorbic Acid (VITAMIN C) 1000 MG tablet Take 1,000 mg by mouth daily.   . Cholecalciferol (VITAMIN D3) 2000 units capsule Take 2,000 Units by mouth daily.  . ferrous sulfate 325 (65 FE) MG tablet Take 325 mg by mouth daily.   . furosemide (LASIX) 40 MG tablet Take 1 tablet (40 mg total) by mouth 2 (two) times daily. Take 1 extra tablet on Monday, Wednesday, Friday.  . hydrALAZINE (APRESOLINE) 25 MG tablet Take 1.5 tablets (37.5 mg total) by mouth 3 (three) times daily.  Marland Kitchen ibuprofen (ADVIL,MOTRIN) 800 MG tablet Take 1 tablet (800 mg total) by mouth every 8 (eight) hours as needed. (Patient taking differently: Take 800 mg by mouth every 8 (eight) hours as needed (PAIN). )  . losartan-hydrochlorothiazide (HYZAAR) 100-12.5 MG tablet Take 1 tablet by mouth every morning.   . metoprolol tartrate (LOPRESSOR) 25 MG tablet Take 25 mg by mouth every morning.   . potassium chloride (K-DUR) 10 MEQ tablet Take 10 mEq by mouth every morning.    No facility-administered encounter medications on file as of 10/03/2018.    No Known Allergies  Past Medical History:  Diagnosis Date   . Cervical cancer [facility])    gyn oncologist-  dr Gerarda Fraction--  bx 08-25-2018  ,  invasive SCC, Stage IA1  . Chronic diastolic (congestive) heart failure [facility])    cardiologist-  dr Bettina Gavia  . GERD (gastroesophageal reflux disease)    per pt just drinks water  . Hypertension   . IDA (iron deficiency anemia)    Past Surgical History:  Procedure Laterality Date  . BREAST SURGERY Left age 64   Lumpectomy-benign  . CERVICAL CONIZATION W/BX N/A 08/25/2018   Procedure: CONIZATION CERVIX WITH BIOPSY AND ENDOCERVICAL CURETTAGE;  Surgeon: Isabel Caprice, MD;  Location: [facility];  Service: Gynecology;  Laterality: N/A;  . LEFT HEART CATH AND CORONARY ANGIOGRAPHY N/A 08/13/2018   Procedure: LEFT HEART CATH AND CORONARY ANGIOGRAPHY;  Surgeon: Troy Sine, MD;  Location: [place];  Service: Cardiovascular;  Laterality: N/A;        Past Gynecological History:   GYNECOLOGIC HISTORY:  . No LMP recorded. (Menstrual status: Irregular Periods).  . Menarche: 47 years old . P 3 . Contraceptive None . HRT NA  . Last Pap ?never before workup Family Hx:  Family History  Problem Relation Age of Onset  . Hypertension Mother   . Congestive Heart Failure Mother   . Hypertension Father   . Diabetes Father   . Cancer Father        Prostate  . Kidney cancer Son        son died from disease   Social Hx:  .  Tobacco use: quit ~2 weeks ago . Alcohol use: 1 drink per day . Illicit Drug use: none . Illicit IV Drug use: none    Review of Systems: Review of Systems  Constitutional: Positive for fatigue.  Cardiovascular: Positive for chest pain.  All other systems reviewed and are negative. chest pain has been worked up.   Vitals:   Vitals:   10/03/18 1644  Weight: 265 lb 6.4 oz (120.4 kg)  Height: 5\' 7"  (1.702 m)   Body mass index is 41.57 kg/m.  Physical Exam: General :  Well developed, 47 y.o., female in no apparent distress HEENT:  Normocephalic/atraumatic,  symmetric, EOMI, eyelids normal Neck:   Supple, no masses.  Lymphatics:  No cervical/ submandibular/ supraclavicular/ infraclavicular/ inguinal adenopathy Respiratory:  Respirations unlabored, no use of accessory muscles CV:   Deferred Breast:  Deferred Musculoskeletal: No CVA tenderness, normal muscle strength. Abdomen:  Soft, non-tender and nondistended. No evidence of hernia. No masses. Extremities:  No lymphedema, no erythema, non-tender. Skin:   Normal inspection Neuro/Psych:  No focal motor deficit, no abnormal mental status. Normal gait. Normal affect. Alert and oriented to person, place, and time  Genito Urinary: Vulva: Normal external female genitalia.  Bladder/urethra: Urethral meatus normal in size and location. No lesions or   masses, well supported bladder Speculum exam: Vagina: No lesion, no discharge, no bleeding. Cervix: postcone with the posterior lip fused to the posterior vagina. No gross lesions Bimanual exam: Confirms above with the posterior fornix somewhat obliterated. Uterus: Normal size, mobile.  Adnexal region: No masses. Rectovaginal:  Good tone, no masses, no cul de sac nodularity, no parametrial involvement or nodularity.    Assessment  Stage IA1 SCCa Cervix ECOG PERFORMANCE STATUS: 0 - Asymptomatic  Plan  1. Surgical pathology was reviewed last visit and she understands this is cancer, but luckily seems to have been caught early. ? She was given a copy of the report  2. Management ? Given this is Stage IA1 with no LVSI, per NCCN guidelines she is a candidate for a simple hysterectomy ? She is 47 yo and does not desire fertility, however, given the low risk of ovarian metastases I discouraged ovarian removal given her age<50. She agrees to retention of the ovaries. ? I did offer bilateral salpingectomy and she would like to proceed. 3. Surgical discussion ? We reviewed the risks, benefits, and alternatives using the surgical sketch today. She was given  a copy of this. ? Plan is for RA-TLH/bilateral salpingectomy ? We specifically addressed the risk posteriorly of injury to the rectum/fistula given the exam shows her posterior cervical lip is fused to the posterior vagina. 4. We have not yet addressed the infectious nature of cervical cancer and indication for STD testing. This will need to happen at future visits ideally when she has no family member in the room.  15  minutes of direct face to face counseling time was spent with the patient. This included discussion about prognosis, therapy recommendations including additional surgery, and postoperative side effects that are beyond the scope of routine postoperative care.   Isabel Caprice, MD  10/04/2018, 7:19 AM    Cc: Donalynn Furlong, MD (Referring Ob/Gyn) - not 10/03/18 Forrest Moron, MD (PCP)

## 2018-10-13 MED ORDER — DEXTROSE 5 % IV SOLN
3.0000 g | INTRAVENOUS | Status: DC
  Filled 2018-10-13: qty 3000

## 2018-10-13 NOTE — Progress Notes (Signed)
Kayla Carey made aware to arrive 10/14/2018 at 8:30AM and to drink her Ensure Pre surgery drink at 7:30AM. She verbalized understanding.

## 2018-10-14 ENCOUNTER — Encounter (HOSPITAL_COMMUNITY)
Admission: RE | Disposition: A | Discharge status: Home / Self Care | Payer: Self-pay | Source: Home / Self Care | Attending: Obstetrics

## 2018-10-14 ENCOUNTER — Other Ambulatory Visit: Payer: Self-pay

## 2018-10-14 ENCOUNTER — Encounter (HOSPITAL_COMMUNITY): Payer: Self-pay

## 2018-10-14 ENCOUNTER — Ambulatory Visit (HOSPITAL_COMMUNITY)
Admission: RE | Admit: 2018-10-14 | Discharge: 2018-10-15 | Disposition: A | Discharge status: Home / Self Care | Payer: 59 | Attending: Obstetrics | Admitting: Obstetrics

## 2018-10-14 ENCOUNTER — Ambulatory Visit (HOSPITAL_COMMUNITY): Payer: 59 | Admitting: Certified Registered Nurse Anesthetist

## 2018-10-14 DIAGNOSIS — C539 Malignant neoplasm of cervix uteri, unspecified: Secondary | ICD-10-CM | POA: Diagnosis not present

## 2018-10-14 DIAGNOSIS — D270 Benign neoplasm of right ovary: Secondary | ICD-10-CM | POA: Insufficient documentation

## 2018-10-14 DIAGNOSIS — I110 Hypertensive heart disease with heart failure: Secondary | ICD-10-CM | POA: Insufficient documentation

## 2018-10-14 DIAGNOSIS — R8569 Abnormal cytological findings in specimens from other digestive organs and abdominal cavity: Secondary | ICD-10-CM | POA: Insufficient documentation

## 2018-10-14 DIAGNOSIS — D259 Leiomyoma of uterus, unspecified: Secondary | ICD-10-CM | POA: Insufficient documentation

## 2018-10-14 DIAGNOSIS — Z6841 Body Mass Index (BMI) 40.0 and over, adult: Secondary | ICD-10-CM | POA: Insufficient documentation

## 2018-10-14 DIAGNOSIS — Z79899 Other long term (current) drug therapy: Secondary | ICD-10-CM | POA: Insufficient documentation

## 2018-10-14 DIAGNOSIS — Z87891 Personal history of nicotine dependence: Secondary | ICD-10-CM | POA: Insufficient documentation

## 2018-10-14 DIAGNOSIS — N736 Female pelvic peritoneal adhesions (postinfective): Secondary | ICD-10-CM | POA: Insufficient documentation

## 2018-10-14 DIAGNOSIS — N800 Endometriosis of uterus: Secondary | ICD-10-CM | POA: Insufficient documentation

## 2018-10-14 DIAGNOSIS — I5032 Chronic diastolic (congestive) heart failure: Secondary | ICD-10-CM | POA: Insufficient documentation

## 2018-10-14 DIAGNOSIS — D069 Carcinoma in situ of cervix, unspecified: Secondary | ICD-10-CM | POA: Insufficient documentation

## 2018-10-14 LAB — PREGNANCY, URINE: PREG TEST UR: NEGATIVE

## 2018-10-14 SURGERY — XI ROBOTIC ASSISTED TOTAL HYSTERECTOMY WITH SALPINGECTOMY
Anesthesia: General | Laterality: Bilateral

## 2018-10-14 MED ORDER — GABAPENTIN 300 MG PO CAPS
300.0000 mg | ORAL_CAPSULE | Freq: Three times a day (TID) | ORAL | Status: DC
  Filled 2018-10-14: qty 1

## 2018-10-14 MED ORDER — OXYCODONE HCL 5 MG PO TABS
5.0000 mg | ORAL_TABLET | ORAL | Status: DC | PRN
  Administered 2018-10-14: 5 mg via ORAL
  Filled 2018-10-14: qty 1

## 2018-10-14 MED ORDER — ACETAMINOPHEN 500 MG PO TABS
1000.0000 mg | ORAL_TABLET | Freq: Four times a day (QID) | ORAL | Status: DC
  Administered 2018-10-14 – 2018-10-15 (×3): 1000 mg via ORAL
  Filled 2018-10-14 (×3): qty 2

## 2018-10-14 MED ORDER — KCL IN DEXTROSE-NACL 20-5-0.45 MEQ/L-%-% IV SOLN
INTRAVENOUS | Status: DC
  Administered 2018-10-14: 18:00:00 via INTRAVENOUS
  Filled 2018-10-14: qty 1000

## 2018-10-14 MED ORDER — ONDANSETRON HCL 4 MG/2ML IJ SOLN: 4.0000 mg | Freq: Four times a day (QID) | INTRAMUSCULAR | Status: DC | PRN

## 2018-10-14 MED ORDER — LIDOCAINE 2% (20 MG/ML) 5 ML SYRINGE
INTRAMUSCULAR | Status: AC
  Filled 2018-10-14: qty 5

## 2018-10-14 MED ORDER — FENTANYL CITRATE (PF) 100 MCG/2ML IJ SOLN
INTRAMUSCULAR | Status: AC
  Filled 2018-10-14: qty 4

## 2018-10-14 MED ORDER — FENTANYL CITRATE (PF) 100 MCG/2ML IJ SOLN
25.0000 ug | INTRAMUSCULAR | Status: DC | PRN
  Administered 2018-10-14 (×2): 50 ug via INTRAVENOUS

## 2018-10-14 MED ORDER — FENTANYL CITRATE (PF) 100 MCG/2ML IJ SOLN
INTRAMUSCULAR | Status: DC | PRN
  Administered 2018-10-14 (×2): 100 ug via INTRAVENOUS

## 2018-10-14 MED ORDER — SCOPOLAMINE 1 MG/3DAYS TD PT72
1.0000 | MEDICATED_PATCH | TRANSDERMAL | Status: DC
  Administered 2018-10-14: 1.5 mg via TRANSDERMAL
  Filled 2018-10-14: qty 1

## 2018-10-14 MED ORDER — ONDANSETRON HCL 4 MG/2ML IJ SOLN
INTRAMUSCULAR | Status: AC
  Filled 2018-10-14: qty 2

## 2018-10-14 MED ORDER — ONDANSETRON HCL 4 MG/2ML IJ SOLN
INTRAMUSCULAR | Status: DC | PRN
  Administered 2018-10-14: 4 mg via INTRAVENOUS

## 2018-10-14 MED ORDER — DOCUSATE SODIUM 100 MG PO CAPS
100.0000 mg | ORAL_CAPSULE | Freq: Two times a day (BID) | ORAL | Status: DC
  Administered 2018-10-14 – 2018-10-15 (×2): 100 mg via ORAL
  Filled 2018-10-14 (×2): qty 1

## 2018-10-14 MED ORDER — KETAMINE HCL 10 MG/ML IJ SOLN
INTRAMUSCULAR | Status: DC | PRN
  Administered 2018-10-14: 30 mg via INTRAVENOUS

## 2018-10-14 MED ORDER — CELECOXIB 200 MG PO CAPS
400.0000 mg | ORAL_CAPSULE | ORAL | Status: AC
  Administered 2018-10-14: 400 mg via ORAL
  Filled 2018-10-14: qty 2

## 2018-10-14 MED ORDER — PHENYLEPHRINE 40 MCG/ML (10ML) SYRINGE FOR IV PUSH (FOR BLOOD PRESSURE SUPPORT)
PREFILLED_SYRINGE | INTRAVENOUS | Status: AC
  Filled 2018-10-14: qty 10

## 2018-10-14 MED ORDER — ROCURONIUM BROMIDE 10 MG/ML (PF) SYRINGE
PREFILLED_SYRINGE | INTRAVENOUS | Status: AC
  Filled 2018-10-14: qty 10

## 2018-10-14 MED ORDER — IBUPROFEN 200 MG PO TABS
600.0000 mg | ORAL_TABLET | Freq: Four times a day (QID) | ORAL | Status: DC
  Administered 2018-10-15: 600 mg via ORAL
  Filled 2018-10-14: qty 3

## 2018-10-14 MED ORDER — ALBUTEROL SULFATE (2.5 MG/3ML) 0.083% IN NEBU: 2.5000 mg | INHALATION_SOLUTION | Freq: Four times a day (QID) | RESPIRATORY_TRACT | Status: DC | PRN

## 2018-10-14 MED ORDER — GABAPENTIN 300 MG PO CAPS
300.0000 mg | ORAL_CAPSULE | Freq: Three times a day (TID) | ORAL | Status: DC
  Administered 2018-10-14 – 2018-10-15 (×3): 300 mg via ORAL
  Filled 2018-10-14 (×3): qty 1

## 2018-10-14 MED ORDER — GLYCOPYRROLATE PF 0.2 MG/ML IJ SOSY
PREFILLED_SYRINGE | INTRAMUSCULAR | Status: AC
  Filled 2018-10-14: qty 1

## 2018-10-14 MED ORDER — TRAMADOL HCL 50 MG PO TABS
100.0000 mg | ORAL_TABLET | Freq: Four times a day (QID) | ORAL | Status: DC | PRN
  Administered 2018-10-15: 100 mg via ORAL
  Filled 2018-10-14: qty 2

## 2018-10-14 MED ORDER — FENTANYL CITRATE (PF) 250 MCG/5ML IJ SOLN
INTRAMUSCULAR | Status: AC
  Filled 2018-10-14: qty 5

## 2018-10-14 MED ORDER — GLYCOPYRROLATE PF 0.2 MG/ML IJ SOSY
PREFILLED_SYRINGE | INTRAMUSCULAR | Status: DC | PRN
  Administered 2018-10-14: .2 mg via INTRAVENOUS

## 2018-10-14 MED ORDER — ACETAMINOPHEN 500 MG PO TABS
1000.0000 mg | ORAL_TABLET | ORAL | Status: AC
  Administered 2018-10-14: 1000 mg via ORAL
  Filled 2018-10-14: qty 2

## 2018-10-14 MED ORDER — HYDROCHLOROTHIAZIDE 12.5 MG PO CAPS
12.5000 mg | ORAL_CAPSULE | Freq: Every day | ORAL | Status: DC
  Administered 2018-10-15: 12.5 mg via ORAL
  Filled 2018-10-14: qty 1

## 2018-10-14 MED ORDER — BUPIVACAINE-EPINEPHRINE (PF) 0.25% -1:200000 IJ SOLN
INTRAMUSCULAR | Status: AC
  Filled 2018-10-14: qty 30

## 2018-10-14 MED ORDER — LIDOCAINE 2% (20 MG/ML) 5 ML SYRINGE
INTRAMUSCULAR | Status: DC | PRN
  Administered 2018-10-14: 60 mg via INTRAVENOUS

## 2018-10-14 MED ORDER — DEXAMETHASONE SODIUM PHOSPHATE 4 MG/ML IJ SOLN
4.0000 mg | INTRAMUSCULAR | Status: AC
  Administered 2018-10-14: 10 mg via INTRAVENOUS

## 2018-10-14 MED ORDER — GABAPENTIN 300 MG PO CAPS
300.0000 mg | ORAL_CAPSULE | ORAL | Status: AC
  Administered 2018-10-14: 300 mg via ORAL
  Filled 2018-10-14: qty 1

## 2018-10-14 MED ORDER — ROCURONIUM BROMIDE 10 MG/ML (PF) SYRINGE
PREFILLED_SYRINGE | INTRAVENOUS | Status: DC | PRN
  Administered 2018-10-14: 50 mg via INTRAVENOUS
  Administered 2018-10-14: 10 mg via INTRAVENOUS
  Administered 2018-10-14: 30 mg via INTRAVENOUS

## 2018-10-14 MED ORDER — DEXTROSE 5 % IV SOLN
INTRAVENOUS | Status: DC | PRN
  Administered 2018-10-14: 3 g via INTRAVENOUS

## 2018-10-14 MED ORDER — METOPROLOL TARTRATE 25 MG PO TABS
25.0000 mg | ORAL_TABLET | Freq: Every morning | ORAL | Status: DC
  Administered 2018-10-15: 25 mg via ORAL
  Filled 2018-10-14: qty 1

## 2018-10-14 MED ORDER — ALBUTEROL SULFATE HFA 108 (90 BASE) MCG/ACT IN AERS: 1.0000 | INHALATION_SPRAY | Freq: Four times a day (QID) | RESPIRATORY_TRACT | Status: DC | PRN

## 2018-10-14 MED ORDER — MIDAZOLAM HCL 5 MG/5ML IJ SOLN
INTRAMUSCULAR | Status: DC | PRN
  Administered 2018-10-14: 2 mg via INTRAVENOUS

## 2018-10-14 MED ORDER — AMLODIPINE BESYLATE 5 MG PO TABS
5.0000 mg | ORAL_TABLET | Freq: Every evening | ORAL | Status: DC
  Administered 2018-10-14: 5 mg via ORAL
  Filled 2018-10-14: qty 1

## 2018-10-14 MED ORDER — HYDROMORPHONE HCL 1 MG/ML IJ SOLN
0.5000 mg | INTRAMUSCULAR | Status: DC | PRN
  Administered 2018-10-14: 0.5 mg via INTRAVENOUS
  Filled 2018-10-14: qty 0.5

## 2018-10-14 MED ORDER — DEXAMETHASONE SODIUM PHOSPHATE 10 MG/ML IJ SOLN
INTRAMUSCULAR | Status: AC
  Filled 2018-10-14: qty 1

## 2018-10-14 MED ORDER — HYDRALAZINE HCL 25 MG PO TABS
37.5000 mg | ORAL_TABLET | Freq: Three times a day (TID) | ORAL | Status: DC
  Administered 2018-10-14 – 2018-10-15 (×2): 37.5 mg via ORAL
  Filled 2018-10-14 (×2): qty 2

## 2018-10-14 MED ORDER — PROMETHAZINE HCL 25 MG/ML IJ SOLN: 6.2500 mg | INTRAMUSCULAR | Status: DC | PRN

## 2018-10-14 MED ORDER — ENOXAPARIN SODIUM 40 MG/0.4ML ~~LOC~~ SOLN
40.0000 mg | SUBCUTANEOUS | Status: DC
  Administered 2018-10-15: 40 mg via SUBCUTANEOUS
  Filled 2018-10-14: qty 0.4

## 2018-10-14 MED ORDER — PROPOFOL 10 MG/ML IV BOLUS
INTRAVENOUS | Status: DC | PRN
  Administered 2018-10-14: 150 mg via INTRAVENOUS

## 2018-10-14 MED ORDER — LOSARTAN POTASSIUM 50 MG PO TABS
100.0000 mg | ORAL_TABLET | Freq: Every day | ORAL | Status: DC
  Administered 2018-10-15: 100 mg via ORAL
  Filled 2018-10-14: qty 2

## 2018-10-14 MED ORDER — LACTATED RINGERS IV SOLN
INTRAVENOUS | Status: DC
  Administered 2018-10-14: 13:00:00 via INTRAVENOUS
  Administered 2018-10-14: 1000 mL via INTRAVENOUS

## 2018-10-14 MED ORDER — MIDAZOLAM HCL 2 MG/2ML IJ SOLN
INTRAMUSCULAR | Status: AC
  Filled 2018-10-14: qty 2

## 2018-10-14 MED ORDER — LOSARTAN POTASSIUM-HCTZ 100-12.5 MG PO TABS: 1.0000 | ORAL_TABLET | Freq: Every morning | ORAL | Status: DC

## 2018-10-14 MED ORDER — LIDOCAINE 2% (20 MG/ML) 5 ML SYRINGE
INTRAMUSCULAR | Status: DC | PRN
  Administered 2018-10-14: 1.5 mg/kg/h via INTRAVENOUS

## 2018-10-14 MED ORDER — STERILE WATER FOR IRRIGATION IR SOLN
Status: DC | PRN
  Administered 2018-10-14: 1000 mL

## 2018-10-14 MED ORDER — PHENYLEPHRINE 40 MCG/ML (10ML) SYRINGE FOR IV PUSH (FOR BLOOD PRESSURE SUPPORT)
PREFILLED_SYRINGE | INTRAVENOUS | Status: DC | PRN
  Administered 2018-10-14 (×4): 80 ug via INTRAVENOUS

## 2018-10-14 MED ORDER — ONDANSETRON HCL 4 MG PO TABS: 4.0000 mg | ORAL_TABLET | Freq: Four times a day (QID) | ORAL | Status: DC | PRN

## 2018-10-14 SURGICAL SUPPLY — 71 items
APPLICATOR COTTON TIP 6 STRL (MISCELLANEOUS) ×1 IMPLANT
APPLICATOR COTTON TIP 6IN STRL (MISCELLANEOUS) ×3
APPLICATOR SURGIFLO ENDO (HEMOSTASIS) IMPLANT
BAG LAPAROSCOPIC 12 15 PORT 16 (BASKET) ×1 IMPLANT
BAG RETRIEVAL 12/15 (BASKET) ×2
BAG RETRIEVAL 12/15MM (BASKET) ×1
COVER BACK TABLE 60X90IN (DRAPES) ×3 IMPLANT
COVER TIP SHEARS 8 DVNC (MISCELLANEOUS) ×1 IMPLANT
COVER TIP SHEARS 8MM DA VINCI (MISCELLANEOUS) ×2
COVER WAND RF STERILE (DRAPES) IMPLANT
DERMABOND ADVANCED (GAUZE/BANDAGES/DRESSINGS) ×2
DERMABOND ADVANCED .7 DNX12 (GAUZE/BANDAGES/DRESSINGS) ×1 IMPLANT
DRAPE ARM DVNC X/XI (DISPOSABLE) ×4 IMPLANT
DRAPE COLUMN DVNC XI (DISPOSABLE) ×1 IMPLANT
DRAPE DA VINCI XI ARM (DISPOSABLE) ×8
DRAPE DA VINCI XI COLUMN (DISPOSABLE) ×2
DRAPE SHEET LG 3/4 BI-LAMINATE (DRAPES) ×3 IMPLANT
DRAPE SURG IRRIG POUCH 19X23 (DRAPES) ×3 IMPLANT
DRAPE UNDERBUTTOCKS STRL (DRAPE) ×3 IMPLANT
ELECT REM PT RETURN 15FT ADLT (MISCELLANEOUS) ×3 IMPLANT
GLOVE BIO SURGEON STRL SZ 6 (GLOVE) IMPLANT
GLOVE BIO SURGEON STRL SZ 6.5 (GLOVE) IMPLANT
GLOVE BIO SURGEONS STRL SZ 6.5 (GLOVE)
GLOVE BIOGEL PI IND STRL 7.0 (GLOVE) ×3 IMPLANT
GLOVE BIOGEL PI INDICATOR 7.0 (GLOVE) ×6
GLOVE SURG SS PI 6.5 STRL IVOR (GLOVE) ×9 IMPLANT
GOWN STRL REUS W/ TWL LRG LVL3 (GOWN DISPOSABLE) ×1 IMPLANT
GOWN STRL REUS W/TWL LRG LVL3 (GOWN DISPOSABLE) ×2
GOWN STRL REUS W/TWL XL LVL3 (GOWN DISPOSABLE) ×6 IMPLANT
GYRUS RUMI II 2.5CM BLUE (DISPOSABLE) ×3
GYRUS RUMI II 3.5CM BLUE (DISPOSABLE)
HOLDER FOLEY CATH W/STRAP (MISCELLANEOUS) ×3 IMPLANT
IRRIG SUCT STRYKERFLOW 2 WTIP (MISCELLANEOUS) ×3
IRRIGATION SUCT STRKRFLW 2 WTP (MISCELLANEOUS) ×1 IMPLANT
KIT PROCEDURE DA VINCI SI (MISCELLANEOUS)
KIT PROCEDURE DVNC SI (MISCELLANEOUS) IMPLANT
MANIPULATOR UTERINE 4.5 ZUMI (MISCELLANEOUS) IMPLANT
NEEDLE HYPO 25X1 1.5 SAFETY (NEEDLE) ×3 IMPLANT
NEEDLE SPNL 18GX3.5 QUINCKE PK (NEEDLE) IMPLANT
OBTURATOR OPTICAL STANDARD 8MM (TROCAR) ×2
OBTURATOR OPTICAL STND 8 DVNC (TROCAR) ×1
OBTURATOR OPTICALSTD 8 DVNC (TROCAR) ×1 IMPLANT
PACK ROBOT GYN CUSTOM WL (TRAY / TRAY PROCEDURE) ×3 IMPLANT
PAD POSITIONING PINK XL (MISCELLANEOUS) ×3 IMPLANT
PORT ACCESS TROCAR AIRSEAL 12 (TROCAR) ×1 IMPLANT
PORT ACCESS TROCAR AIRSEAL 5M (TROCAR) ×2
POUCH SPECIMEN RETRIEVAL 10MM (ENDOMECHANICALS) IMPLANT
RUMI II 3.0CM BLUE KOH-EFFICIE (DISPOSABLE) IMPLANT
RUMI II GYRUS 2.5CM BLUE (DISPOSABLE) ×1 IMPLANT
RUMI II GYRUS 3.5CM BLUE (DISPOSABLE) IMPLANT
SEAL CANN UNIV 5-8 DVNC XI (MISCELLANEOUS) ×4 IMPLANT
SEAL XI 5MM-8MM UNIVERSAL (MISCELLANEOUS) ×8
SET TRI-LUMEN FLTR TB AIRSEAL (TUBING) ×3 IMPLANT
SURGIFLO W/THROMBIN 8M KIT (HEMOSTASIS) IMPLANT
SUT VIC AB 0 CT1 27 (SUTURE)
SUT VIC AB 0 CT1 27XBRD ANTBC (SUTURE) IMPLANT
SUT VIC AB 4-0 PS2 27 (SUTURE) ×6 IMPLANT
SUT VLOC 180 0 9IN  GS21 (SUTURE) ×2
SUT VLOC 180 0 9IN GS21 (SUTURE) ×1 IMPLANT
SUT VLOC 180 3-0 9IN GS21 (SUTURE) ×6 IMPLANT
SYR 10ML LL (SYRINGE) IMPLANT
TIP RUMI ORANGE 6.7MMX12CM (TIP) IMPLANT
TIP UTERINE 5.1X6CM LAV DISP (MISCELLANEOUS) IMPLANT
TIP UTERINE 6.7X10CM GRN DISP (MISCELLANEOUS) IMPLANT
TIP UTERINE 6.7X6CM WHT DISP (MISCELLANEOUS) IMPLANT
TIP UTERINE 6.7X8CM BLUE DISP (MISCELLANEOUS) ×3 IMPLANT
TOWEL OR NON WOVEN STRL DISP B (DISPOSABLE) ×3 IMPLANT
TRAP SPECIMEN MUCOUS 40CC (MISCELLANEOUS) ×3 IMPLANT
TRAY FOLEY MTR SLVR 16FR STAT (SET/KITS/TRAYS/PACK) ×3 IMPLANT
UNDERPAD 30X30 (UNDERPADS AND DIAPERS) ×3 IMPLANT
WATER STERILE IRR 1000ML POUR (IV SOLUTION) ×3 IMPLANT

## 2018-10-14 NOTE — Interval H&P Note (Signed)
History and Physical Interval Note:  10/14/2018 10:07 AM  Kayla Carey  has presented today for surgery, with the diagnosis of CERVICAL CANCER  The various methods of treatment have been discussed with the patient and family. After consideration of risks, benefits and other options for treatment, the patient has consented to  Procedure(s): XI ROBOTIC ASSISTED TOTAL HYSTERECTOMY WITH BILATERAL SALPINGECTOMY (Bilateral) as a surgical intervention .  The patient's history has been reviewed, patient examined, no change in status, stable for surgery.  I have reviewed the patient's chart and labs.  Questions were answered to the patient's satisfaction.     Isabel Caprice

## 2018-10-14 NOTE — Anesthesia Procedure Notes (Signed)
Procedure Name: Intubation Date/Time: 10/14/2018 10:50 AM Performed by: Claudia Desanctis, CRNA Pre-anesthesia Checklist: Patient identified, Emergency Drugs available, Suction available and Patient being monitored Patient Re-evaluated:Patient Re-evaluated prior to induction Oxygen Delivery Method: Circle system utilized Preoxygenation: Pre-oxygenation with 100% oxygen Induction Type: IV induction Ventilation: Mask ventilation without difficulty Laryngoscope Size: 2 and Miller Grade View: Grade I Tube type: Oral Tube size: 7.5 mm Number of attempts: 1 Airway Equipment and Method: Stylet Placement Confirmation: ETT inserted through vocal cords under direct vision,  positive ETCO2 and breath sounds checked- equal and bilateral Secured at: 21 cm Tube secured with: Tape Dental Injury: Teeth and Oropharynx as per pre-operative assessment

## 2018-10-14 NOTE — Progress Notes (Signed)
BP remains elevated after Norvasc dose given. Dr Delsa Sale notified and no new orders noted. Reagyn Facemire,RN

## 2018-10-14 NOTE — Op Note (Signed)
OPERATIVE NOTE  Date: 10/14/18  Preoperative Diagnosis: Stage IA1 Cervical cancer   Postoperative Diagnosis:   1. Stage IA1 Cervical cancer 2. Right ovarian neoplasm  Procedure(s) Performed: Robotic-assisted laparoscopic hysterectomy, right oophorectomy, bilateral salpingectomy, Pelvic washings  Surgeon: Bernita Raisin, MD  Assistant Surgeon: Lahoma Crocker M.D. (an MD assistant was necessary for tissue manipulation, management of robotic instrumentation, retraction and positioning due to the complexity of the case and hospital policies).   Anesthesia: GETA  Specimens: Uterus with attached cervix, right ovary, bilateral fallopian tubes, pelvic washings  Complications: None  Indication for Procedure:  Patient with Stage IA1 Cervical cancer desiring definitive surgery. Negative LVSI. Therefore proceeded with simple hysterectomy. Intraoperatively a large right adnexal cyst was identified. The ovary could not be separated from this lesion. Therefore the decision was made to take this ovary out.   Operative Findings: See Indication above. No obvious extracervical extension of disease. Right adenxal mass with adhesions to right pelvic sidewall. Difficult dissection of bladder away from vagina,likely due to prior conization.  Frozen pathology on the ovary was consistent with benign neoplasm, defer to permanent.  Procedure in Detail:  The patient was taken to the operating room. She was placed in supine position with arms at her sides and GETA was induced. She was then placed in dorsolithotomy position and draped and prepped in the usual sterile manner.   A time out was performed. A Foley catheter was placed by me.  A sterile speculum was placed in the vagina.  The cervix was grasped with a single-tooth tenaculum and dilated with Kennon Rounds dilators. The uterus sounded to 8cm. A RUMI II 8cm uterine manipulator with a 2.5 size colpotomizer ring was placed without difficulty.  OG tube placement  was confirmed and to suction.   Next, a 68mm skin incision was made ~ 2cm below the subcostal margin just medial to the midclavicular line.  The 5 mm Optiview port and scope was used for direct entry.  Opening pressure was under 10 mm CO2.  The abdomen was insufflated and the findings were noted as above.   At this point and all points during the procedure, the patient's intra-abdominal pressure did not exceed 15 mmHg. The patient was placed in steep Trendelenburg.  Next, an 64mm skin incision was made inferior to the umbilicus and a right and left port was placed about 6-8 cm lateral to the robot port on the right and left side.  A fourth robotic trocar site was placed on the far right lateral abdominal wall, about 6-8 cm from the most lateral trocar already in place. All ports were placed under direct visualization.  Bowel was gently folded away into the upper abdomen.  The robot was docked in the normal manner.  There was a notable right ovarian cyst. This was large and unanticipated. Washings were therefore obtained. Decision was made to remove that adnexa.  The round ligament on the right side was incised and the retroperitoneum was entered and the pararectal space was developed.  The ureter was noted to be on the medial leaf of the broad ligament.  The peritoneum above the ureter was incised and stretched and the infundibulopelvic ligament was skeletonized, cauterized and cut.  It was moderately adherent to the posterior broad ligament and required some minimal ureterolysis to safely dissect away from the sidewall. Once the uteroovarian ligament was isolated the adnexa was separated away from the uterus to provide better pelvic visualization/access. The sepcimen (right tube/ovary) was placed into a 21mm Endocatch  bag which was closed and then elevated into the upper abdomen.   The hysterectomy was initiated by further dissecting the posterior peritoneum down to the level of the KOH ring.  The anterior  peritoneum was also taken down.  The bladder flap was created with some difficulty to the level of the KOH ring.  The uterine artery on the right side was skeletonized, cauterized and cut in the normal manner.  A similar procedure was performed on the left, sparing the left ovary.  The colpotomy was made and the uterus, cervix, and left fallopian tube were amputated and delivered through the vagina.  The Endocatch bag with the right tube/ovary was delivered through the colpotomy site. Pedicles were inspected and excellent hemostasis was achieved.    The colpotomy at the vaginal cuff was closed with two 0-V-Loc sutures, starting at either end, meeting in the midline then traveling back for 2 bites. Irrigation was used and excellent hemostasis was achieved.    Frozen section returned with a benign adnexal neoplasm.  At this point in the procedure was completed.  Robotic instruments were removed under direct visulaization.  The robot was undocked. The 10 mm port site fascia was closed with Vicryl on a UR-6 needle.  The skin was closed with 4-0 Vicryl in the dermis and then 4-0 monocryl in a subcuticular manner.  Dermabond was applied.    The vagina was swabbed with  minimal bleeding noted.   All instrument and needle counts were correct x  2.  Disposition: PACU -         Condition: Stable

## 2018-10-14 NOTE — Anesthesia Preprocedure Evaluation (Addendum)
Anesthesia Evaluation  Patient identified by MRN, date of birth, ID band Patient awake    Reviewed: Allergy & Precautions, NPO status , Patient's Chart, lab work & pertinent test results, reviewed documented beta blocker date and time   Airway Mallampati: III  TM Distance: >3 FB Neck ROM: Full    Dental  (+) Dental Advisory Given, Chipped, Missing   Pulmonary former smoker,    Pulmonary exam normal breath sounds clear to auscultation       Cardiovascular hypertension, Pt. on home beta blockers and Pt. on medications +CHF  Normal cardiovascular exam Rhythm:Regular Rate:Normal  Echo 10//22/19: Study Conclusions  - Left ventricle: The cavity size was normal. Wall thickness was increased in a pattern of severe LVH. Systolic function was vigorous. The estimated ejection fraction was in the range of 75% to 80%. Wall motion was normal; there were no regional wall motion abnormalities. Doppler parameters are consistent with abnormal left ventricular relaxation (grade 1 diastolic dysfunction). - Left atrium: The atrium was moderately dilated. - Right atrium: The atrium was mildly dilated.   Neuro/Psych negative neurological ROS     GI/Hepatic Neg liver ROS, GERD  ,  Endo/Other  Morbid obesity  Renal/GU negative Renal ROS     Musculoskeletal negative musculoskeletal ROS (+)   Abdominal   Peds  Hematology negative hematology ROS (+)   Anesthesia Other Findings Day of surgery medications reviewed with the patient.  Reproductive/Obstetrics Cervical cancer                            Anesthesia Physical Anesthesia Plan  ASA: III  Anesthesia Plan: General   Post-op Pain Management:    Induction: Intravenous  PONV Risk Score and Plan: 4 or greater and Diphenhydramine, Scopolamine patch - Pre-op, Midazolam, Ondansetron and Dexamethasone  Airway Management Planned: Oral ETT  Additional Equipment:    Intra-op Plan:   Post-operative Plan: Extubation in OR  Informed Consent: I have reviewed the patients History and Physical, chart, labs and discussed the procedure including the risks, benefits and alternatives for the proposed anesthesia with the patient or authorized representative who has indicated his/her understanding and acceptance.   Dental advisory given  Plan Discussed with: CRNA  Anesthesia Plan Comments:         Anesthesia Quick Evaluation

## 2018-10-14 NOTE — Transfer of Care (Signed)
Immediate Anesthesia Transfer of Care Note  Patient: Kayla Carey  Procedure(s) Performed: XI ROBOTIC ASSISTED TOTAL HYSTERECTOMY WITH BILATERAL SALPINGECTOMY; RIGHT OOPHERECTOMY (Bilateral )  Patient Location: PACU  Anesthesia Type:General  Level of Consciousness: awake, alert , oriented and patient cooperative  Airway & Oxygen Therapy: Patient Spontanous Breathing and Patient connected to face mask  Post-op Assessment: Report given to RN and Post -op Vital signs reviewed and stable  Post vital signs: Reviewed and stable  Last Vitals:  Vitals Value Taken Time  BP 140/92 10/14/2018  1:48 PM  Temp    Pulse 70 10/14/2018  1:50 PM  Resp 11 10/14/2018  1:50 PM  SpO2 94 % 10/14/2018  1:50 PM  Vitals shown include unvalidated device data.  Last Pain:  Vitals:   10/14/18 0931  TempSrc:   PainSc: 3       Patients Stated Pain Goal: 4 (24/26/83 4196)  Complications: No apparent anesthesia complications

## 2018-10-14 NOTE — Anesthesia Postprocedure Evaluation (Signed)
Anesthesia Post Note  Patient: Kayla Carey  Procedure(s) Performed: XI ROBOTIC ASSISTED TOTAL HYSTERECTOMY WITH BILATERAL SALPINGECTOMY; RIGHT OOPHERECTOMY (Bilateral )     Patient location during evaluation: PACU Anesthesia Type: General Level of consciousness: awake and alert, awake and oriented Pain management: pain level controlled Vital Signs Assessment: post-procedure vital signs reviewed and stable Respiratory status: spontaneous breathing, nonlabored ventilation, respiratory function stable and patient connected to nasal cannula oxygen Cardiovascular status: blood pressure returned to baseline and stable Postop Assessment: no apparent nausea or vomiting Anesthetic complications: no    Last Vitals:  Vitals:   10/14/18 1430 10/14/18 1456  BP: (!) 150/93 (!) 150/93  Pulse: 65 65  Resp: 15 15  Temp: (!) 36.4 C (!) 36.4 C  SpO2: 100% 100%    Last Pain:  Vitals:   10/14/18 1500  TempSrc:   PainSc: Asleep                 Catalina Gravel

## 2018-10-15 ENCOUNTER — Encounter (HOSPITAL_COMMUNITY): Payer: Self-pay

## 2018-10-15 LAB — CBC
HCT: 40.2 % (ref 36.0–46.0)
Hemoglobin: 13.1 g/dL (ref 12.0–15.0)
MCH: 30.1 pg (ref 26.0–34.0)
MCHC: 32.6 g/dL (ref 30.0–36.0)
MCV: 92.4 fL (ref 80.0–100.0)
Platelets: 268 10*3/uL (ref 150–400)
RBC: 4.35 MIL/uL (ref 3.87–5.11)
RDW: 11.9 % (ref 11.5–15.5)
WBC: 7.5 10*3/uL (ref 4.0–10.5)
nRBC: 0 % (ref 0.0–0.2)

## 2018-10-15 LAB — BASIC METABOLIC PANEL
Anion gap: 9 (ref 5–15)
BUN: 11 mg/dL (ref 6–20)
CO2: 28 mmol/L (ref 22–32)
CREATININE: 0.63 mg/dL (ref 0.44–1.00)
Calcium: 9.1 mg/dL (ref 8.9–10.3)
Chloride: 101 mmol/L (ref 98–111)
GFR calc Af Amer: >60 mL/min (ref >60)
GFR calc non Af Amer: >60 mL/min (ref >60)
Glucose, Bld: 107 mg/dL — ABNORMAL HIGH (ref 70–99)
Potassium: 4 mmol/L (ref 3.5–5.1)
Sodium: 138 mmol/L (ref 135–145)

## 2018-10-15 MED ORDER — OXYCODONE-ACETAMINOPHEN 5-325 MG PO TABS: 1.0000 | ORAL_TABLET | ORAL | 0 refills | Status: AC | PRN

## 2018-10-15 NOTE — Discharge Instructions (Signed)
Planning for °Recovery and °Going Home °Your Guide to Gynecologic Surgery   ° ° °In-Hospital Recovery Plan °Team Caring for You After Surgery °In addition to the nursing staff on the unit, the gynecological surgery team will °care for you. This team is led by your surgeon and includes medical students and a °physician assistant or nurse practitioner. There will be a physician in the °hospital 24 hours a day to tend to your needs. The students °report directly to your surgeon, who is the one overseeing all of your care. ° °Pain Relief After Surgery °Your pain will be assessed regularly on a scale from 0 to 10. Pain assessment is  °necessary to guide your pain relief. It is essential that you are able to take deep °breaths, cough and move. Prevention or early treatment of pain is far more °effective than trying to treat severe pain. Therefore, we have devised a specialized °regimen to stay ahead of your pain and use almost no narcotics, which can slow °down your recovery process. If you have an epidural catheter, you will receive a  °constant infusion of pain medication through your epidural. If you need °additional pain relief, you will be able to push a button to increase the medication in °your epidural. You will also be given acetaminophen and an ibuprofen-like °medication to keep your pain under control.  You can always ask for additional pain pills if you are not comfortable. In most cases an anesthesiologist with expertise in pain management will visit you every day and help design your pain management plan. ° °One Day After Surgery °Focus on drinking and walking. You will start drinking clear liquids after °surgery. The intravenous fluids will be stopped, and the catheter may be removed  °from your bladder. We expect you to get out of bed, with the nurses' or assistants' °help, sit in a chair for six hours and start to move about in the hallways. You will °also meet with a case manager to assess your discharge  needs, including home °nursing. Your physician may order home care to assist with your transition home.  °Home nursing visits, which are intermittent, help you get readjusted to home by °teaching treatments, monitoring medications, and performing clinical assessment °and reporting back to your physician. Other services may include therapy and °medical equipment; private duty services are also available. If you are going °"home" to a different address upon discharge, please alert us. A Home Care °Coordinator can visit with you while in the hospital to discuss your options. If you °have questions please speak with your case manager. If you °need rehabilitation at a facility, a social worker will assist with this. If you need °rehabilitation at a facility, a social worker will assist with this. °If your procedure was performed in a minimally invasive fashion, you will be discharged to home if your pain is well controlled and you are tolerating a regular diet.   ° ° °Two Days After Surgery °You will start eating a soft diet and change to a more solid diet as you feel up to it. The catheter from your bladder will be removed, if not already done so. If there is a dressing on your wound, it will be removed. The tubing will be disconnected from your IV. °We expect you to be out of bed for the majority of the day and walking at least three times in the hallway, with assistance as needed.  You may be discharged at this point if it is felt you are   ready.  ° °Three Days After Surgery °You continue to eat your low residue diet. You may be ready to go home if you are °drinking enough to keep yourself hydrated, your pain is well controlled, you are not °belching or nauseated, you are passing gas and you are able to get around on your °own. However, we will not discharge you from the hospital until we are sure you are °ready. ° °Discharge °Discharge time is at 10 a.m. You will need to make arrangements for someone to °accompany you  home. You will not be released without someone present. °Please keep in mind that we strive to get patients discharged as quickly as possible, °but there may be delays for a variety of reasons. °Complications That May Delay Discharge: °? Nausea and vomiting: It is very common to feel sick after your surgery. We give °you medication to reduce this. However, if you do feel sick, you should reduce the °amount you are taking by mouth. Small, frequent meals or drinks are best in this  °situation. As long as you can drink and keep yourself hydrated, the nausea will likely °pass.  °Ileus: Following surgery, the bowel can be sluggish, making it difficult for food °and gas to pass through the intestines. This is called an ileus. We have designed our °care program to do everything possible to reduce the likelihood of an ileus. If you do °develop an ileus, it usually only lasts two to three days. However, it may require a °small tube down the nose to decompress the stomach. The best way to avoid an °ileus is to reduce the amount of narcotic pain medications, get up as °much as possible after your surgery, and stimulate the bowel early after °surgery with small amounts of food and liquids. ° Wound infection: If a wound infection develops, this usually happens three °to ten days after surgery.  ° ° Urinary retention: This is if you are unable to urinate after the catheter °from your bladder is removed. The catheter may need to be reinserted until °you are able to urinate on your own. This can be caused by anesthesia, pain °medication and decreased activity.  ° ° °When you are preparing to go home, you will receive: ° Detailed discharge instructions, with information about your operation °and medications  ° ° All prescriptions for medications you need at home; prescriptions can be °filled while you are in the hospital if you would like  ° ° You may be prescribed Lovenox. Lovenox is used to reduce the risk of °developing a blood  clot after surgery. °An appointment to see your surgeon or provider one to two weeks after you °leave the hospital for follow-up  ° °After Discharge °Once you are discharged: °Call us at any time if you are worried about your recovery or if °you should have any questions. °During regular office hours, (8:30 a.m.-4:30 p.m.), and after hours call [phone]. ° °Call us immediately if: ° You have a fever higher than 100.4 degrees.  ° °Your wound is red, more painful or has drainage.  ° ° You are nauseated, vomiting or can't keep liquids down.  ° ° Your pain is worse and not able to be controlled with the regimen you were °sent home with.  ° ° If you are bleeding heavily or have a lot of fluid coming from your vagina. °If you are on narcotics, the goal is to wean you off of them. If you are running low °on supply and need more,   call the nurse a few days before you will run out.  °It is generally easier to reach someone between 8:30 a.m. - 4:30 p.m., so call °early if you think something is not right. A nurse or nurse practitioner is °available every day to answer your questions. After hours and on the °weekends, the calls go to the resident doctors in the hospital. It may take °longer for your phone call to be returned during this time. °If you have a true emergency, such as severe abdominal pain, chest pain, °shortness of breath or any other acute issues, call 911 and go to the local °emergency room. Have them contact our team once you are stable. ° °Concerns After Discharge °Bowel Function Following Your Surgery °Your bowels will take several weeks to settle down and may be unpredictable °at first. Your bowel movements may become loose, or you may be constipated. °For the vast number of patients, this will get back to normal with time. Make °sure you eat nutritious meals, drink plenty of fluids and take regular walks °during the first two weeks after your operation. °Your Guide to Gynecologic Surgery   ° °Abdominal  Pain °It is not unusual to suffer gripping pains (colic) during the first week following °removal of a portion of your bowel. This pain usually lasts for a few minutes °but goes away between spasms. If you have severe pain lasting more than one °to two hours or have a fever and feel generally unwell, you should contact us at  °the telephone contact numbers listed at the end of this packet. °Hysterectomy: °You should have pelvic rest for six (6) weeks or as specified by your doctor °after surgery. You should have nothing in the vagina (no tampons, douching, °intercourse, etc.,) during this time period. °If you have some vaginal spotting, this is normal. If you have heavy bleeding or  °a lot of fluid from your vagina, this is NOT normal and you should contact your °doctor's office or, if after hours, contact the doctor on call. ° °Diarrhea: Fiber and Imodium (Loperamide) °The first step to improving your frequent or loose stools is to bulk up the stool °with fiber. Metamucil is the most common type of fiber that is available at any °drug store. Start with 1 teaspoon mixed into food, like yogurt or oatmeal, in °the morning and evening. Try not to drink any fluid for one hour after you take °the fiber. This will allow the fiber to act like a sponge in your intestines, °soaking up all the excess water. Continue this for three to five days. °You may increase by 1 teaspoon every three to five days until the desired affect, °or you are at 1 tablespoon (3 teaspoons) twice a day. If this doesn't work, you °may try over-the-counter Loperamide, which is an antidiarrheal medication. You °may take one tablet in the morning and evening or 30 minutes before you °typically have diarrhea. You may take up to eight of these tablets daily. It is best °to discuss this with us prior to using this medication. If you have continuous °diarrhea and abdominal cramping, call [phone]. ° °Foley Catheter °Your surgeon may recommend you be  discharged home with a foley catheter °(bladder catheter) for 1 to 2 weeks. Typically this recommendation will be °made for patients undergoing surgery to the lower urinary tract. Before you leave the hospital, your nurse should outfit you with a clip on the inner thigh to secure the catheter to prevent pulling as well as a   small bag that can be easily worn on the upper leg under loose fitting pants and skirts. Your nurse will teach you how to exchange the large bag that typically comes with the catheter for the small bag. You may find it °convenient to attach the small bag when active during the day and then the °large bag when sleeping at night. If there is ever a point when you notice the catheter is not draining urine and youbegin to develop pain behind/above the pubic bone, you should report to the clinic or emergency room immediately as the catheter may be kinked or clogged. Kinking or clogging of the catheter prevents urine from draining °from your bladder. Urine will quickly build up in the bladder and can cause °severe pain as well as seriously disrupt healing if you have undergone surgery °on the lower urinary tract. Additionally, pulling on the catheter can result in °displacement of the balloon at the end of the catheter from inside of to outside  °of the bladder. This also results in severe pain and can cause bleeding. For °this reason, secure the catheter to the clip on your inner thigh at all times as the °clip prevents against pulling. ° °Wound Care °For the first few weeks following surgery, your wound may be slightly red and °uncomfortable. You may shower and let the soapy water wash over your incision. °Avoid soaking in the tub for one month following surgery or until °the wound is well healed. It will take the wound several months to °"soften." It is common to have bumpy areas in the wound near the belly  °button and at the ends of the incision. ° °If you have staples, these should be removed  when you are seen by your °surgeon at the follow-up appointment. You may have a glue-like material on °your incision. Do not pick at this. It will come off over time. It is the °surgical glue used in surgery to close your incision. You also have sutures °inside of you that will dissolve over time ° °Post-Surgery Diet °Attention to good nutrition after surgery is important to your recovery. If °you had no dietary restrictions prior to the surgery, you will have no °special dietary restrictions after the surgery. However, consuming enough °protein, calories, vitamins and minerals is necessary to support healing. °Some patients find their appetite is less than normal after surgery. In this °case, frequent small meals throughout the day may help. °It is not uncommon to lose 10 to 15 pounds after surgery. However, by the °fourth to fifth week, your weight loss should stabilize. °It is normal that certain foods taste different and certain smells may make °you nauseas. °Over time, the amount you can comfortably consume will gradually increase. °You should try to eat a balanced diet, which includes: ° Foods that are soft, moist, and easy to chew and swallow  ° ° Canned or soft-cooked fruits and vegetables  ° °Plenty of soft breads, rice, pasta, potatoes and other starchy foods (lowerfiber  °varieties may be tolerated better initially)  ° ° High-protein foods and beverages, such as meats, eggs, milk, cottage cheese  °or a supplemental nutrition drink like Boost or Ensure  ° ° Drink plenty of fluids-at least 8 to 10 cups per day. This includes water,  °fruit juice, Gatorade, teas/coffee and milk. Drinking plenty is especially °important if you have loose stools (diarrhea). ° ° Avoid drinking a lot of caffeine, since this may dehydrate you.  ° ° Avoid fried, greasy and highly seasoned   or spicy foods.  ° ° Avoid carbonated beverages in the first couple weeks.  ° ° Avoid raw fruits and vegetables.  ° °Hobbies/Activities °Walking  is encouraged after your surgery. You should plan to undertake °regular exercise several times a day and gradually increase this during the °four weeks following your operation until you are back to your normal level °of activity. You may climb stairs. Don't do any heavy lifting greater than 10 °pounds or contact sports for the first month after your surgery. °Generally, you can return to hobbies and activities soon after your surgery. °This will help you recover. °It can take up to two to three months to fully recover. It is not unusual to be °fatigued and require an afternoon nap for up to six to eight weeks following °surgery. Your body is using this energy to heal your wounds. Set small goals °for yourself and try to do a little more each day. ° °Work °It is normal to return to work three to six weeks following your operation. If °your job involves heavy manual work, then you should wait six weeks. °However, you should check with your employer regarding rules, which may °be relevant to your return to work. If you need a return-to-work form for your °employer or disability papers, bring them to your follow-up appointment or °fax them to our office at [phone]. ° °Driving °You may drive when you are off narcotics and pain-free enough to react °quickly with your braking foot. For most patients, this occurs at one to four °weeks following surgery. ° ° °Write down any questions you may have to ask your care team. ° °Important Contact Numbers: °GYN Oncology Office: [phone] °

## 2018-10-15 NOTE — Progress Notes (Signed)
Made Dr. Delsa Sale aware of patients request to change the oxycodone prescription to [facility] on [address] and [facility]. In [place] from the [employer], due to the patient staying in [place] with her family post-op. She stated she will change the prescription location. Patient and family aware.

## 2018-10-15 NOTE — Discharge Summary (Signed)
Physician Discharge Summary  Patient ID: Kayla Carey MRN: [id number] DOB/AGE: 48-Nov-1972 48 y.o.  Admit date: 10/14/2018 Discharge date: 10/15/2018  Admission Diagnoses: Cervical cancer Discharge Diagnoses:  Active Problems:   Cervical cancer Kayla Carey) Right adnexal mass  Discharged Condition: good  Carey Course: On 10/14/2018, the patient underwent the following: Procedure(s): XI ROBOTIC ASSISTED TOTAL HYSTERECTOMY WITH BILATERAL SALPINGECTOMY; RIGHT OOPHERECTOMY.   The postoperative course was uneventful.  She was discharged to home on postoperative day 2 tolerating a regular diet.  Consults: None  Significant Diagnostic Studies: None  Treatments: surgery: see above  Discharge Exam: Blood pressure 123/62, pulse 62, temperature 98 F (36.7 C), temperature source Oral, resp. rate 14, height 5\' 7"  (1.702 m), weight 117.9 kg, SpO2 100 %. General appearance: alert Resp: clear to auscultation bilaterally Cardio: regular rate and rhythm, S1, S2 normal, no murmur, click, rub or gallop GI: soft, non-tender; bowel sounds normal; no masses,  no organomegaly Extremities: extremities normal, atraumatic, no cyanosis or edema and Homans sign is negative, no sign of DVT Incision/Wound: C/D/I  Disposition: Discharge disposition: 01-Home or Self Care       Discharge Instructions    Activity as tolerated - No restrictions   Complete by:  As directed    Call MD for:  extreme fatigue   Complete by:  As directed    Call MD for:  persistant dizziness or light-headedness   Complete by:  As directed    Call MD for:  persistant nausea and vomiting   Complete by:  As directed    Call MD for:  redness, tenderness, or signs of infection (pain, swelling, redness, odor or green/yellow discharge around incision site)   Complete by:  As directed    Call MD for:  severe uncontrolled pain   Complete by:  As directed    Call MD for:  temperature >100.4   Complete by:  As directed    Diet -  low sodium heart healthy   Complete by:  As directed    Discharge instructions   Complete by:  As directed    Return to work: 3 - 4 weeks  Activity: 1. Be up and out of the bed during the day.  Take a nap if needed.  You may walk up steps but be careful and use the hand rail.  Stair climbing will tire you more than you think, you may need to stop part way and rest.   2. No lifting or straining for 6 weeks.  3. No driving for 1-2 weeks.  Do Not drive if you are taking narcotic pain medicine.  4. Shower daily.  Use soap and water on your incision and pat dry; don't rub.   5. No sexual activity and nothing in the vagina for 4 weeks.  Diet: 1. Low sodium Heart Healthy Diet is recommended.  2. It is safe to use a laxative if you have difficulty moving your bowels.   Wound Care: 1. Keep clean and dry.  Shower daily.  Reasons to call the Doctor:  Fever - Oral temperature greater than 100.4 degrees Fahrenheit Foul-smelling vaginal discharge Difficulty urinating Nausea and vomiting Increased pain at the site of the incision that is unrelieved with pain medicine. Difficulty breathing with or without chest pain New calf pain especially if only on one side Sudden, continuing increased vaginal bleeding with or without clots.   Follow-up: 1. See Kayla Carey in 4 weeks.  Contacts: For questions or concerns you should contact:  Kayla Carey  [facility] at [phone]  or at [facility]   Discharge wound care:   Complete by:  As directed    Keep clean and dry   Driving Restrictions   Complete by:  As directed    No driving for 1- 2 weeks   Increase activity slowly   Complete by:  As directed    Lifting restrictions   Complete by:  As directed    No lifting > 10 lbs for 6 weeks   May shower / Bathe   Complete by:  As directed    No tub baths for 6 weeks   Sexual Activity Restrictions   Complete by:  As directed    No intercourse for  8 weeks     Allergies as of  10/15/2018   No Known Allergies     Medication List    TAKE these medications   albuterol 108 (90 Base) MCG/ACT inhaler Commonly known as:  PROVENTIL HFA;VENTOLIN HFA Inhale 1 puff into the lungs every 6 (six) hours as needed for wheezing or shortness of breath.   amLODipine 5 MG tablet Commonly known as:  NORVASC Take 5 mg by mouth every evening.   ferrous sulfate 325 (65 FE) MG tablet Take 325 mg by mouth daily.   furosemide 40 MG tablet Commonly known as:  LASIX Take 1 tablet (40 mg total) by mouth 2 (two) times daily. Take 1 extra tablet on Monday, Wednesday, Friday.   hydrALAZINE 25 MG tablet Commonly known as:  APRESOLINE Take 1.5 tablets (37.5 mg total) by mouth 3 (three) times daily.   ibuprofen 800 MG tablet Commonly known as:  ADVIL,MOTRIN Take 1 tablet (800 mg total) by mouth every 8 (eight) hours as needed. What changed:  reasons to take this   losartan-hydrochlorothiazide 100-12.5 MG tablet Commonly known as:  HYZAAR Take 1 tablet by mouth every morning.   metoprolol tartrate 25 MG tablet Commonly known as:  LOPRESSOR Take 25 mg by mouth every morning.   oxyCODONE-acetaminophen 5-325 MG tablet Commonly known as:  PERCOCET/ROXICET Take 1-2 tablets by mouth every 4 (four) hours as needed for up to 3 days for severe pain.   potassium chloride 10 MEQ tablet Commonly known as:  K-DUR Take 10 mEq by mouth every morning.   vitamin C 1000 MG tablet Take 1,000 mg by mouth daily.   Vitamin D3 50 MCG (2000 UT) capsule Take 2,000 Units by mouth daily.            Discharge Care Instructions  (From admission, onward)         Start     Ordered   10/15/18 0000  Discharge wound care:    Comments:  Keep clean and dry   10/15/18 1057         Follow-up Information    Kayla Caprice, MD Follow up.   Specialty:  Gynecologic Oncology Contact information: [place] [address] [phone]           Signed: [name] 10/15/2018, 10:59 AM

## 2018-10-17 ENCOUNTER — Telehealth: Payer: Self-pay

## 2018-10-17 NOTE — Telephone Encounter (Signed)
Told Ms Everson that there was no remaining cancer seen on the final pathology from 10-14-18 per Joylene John, NP. She is doing well post op.   She is eating and drinking a lot of fluids. Bowels have move twice since surgery.  No cc of pain.

## 2018-10-24 ENCOUNTER — Encounter: Payer: Self-pay | Admitting: Obstetrics

## 2018-10-24 ENCOUNTER — Inpatient Hospital Stay: Payer: 59 | Attending: Obstetrics | Admitting: Obstetrics

## 2018-10-24 VITALS — BP 143/83 | HR 84 | Temp 98.8°F | Resp 20 | Ht 67.0 in | Wt 264.9 lb

## 2018-10-24 DIAGNOSIS — Z90710 Acquired absence of both cervix and uterus: Secondary | ICD-10-CM

## 2018-10-24 DIAGNOSIS — Z90722 Acquired absence of ovaries, bilateral: Secondary | ICD-10-CM

## 2018-10-24 DIAGNOSIS — C539 Malignant neoplasm of cervix uteri, unspecified: Secondary | ICD-10-CM

## 2018-10-24 MED ORDER — HYDROCODONE-ACETAMINOPHEN 5-325 MG PO TABS: 1.0000 | ORAL_TABLET | Freq: Four times a day (QID) | ORAL | 0 refills | Status: DC | PRN

## 2018-10-24 NOTE — Progress Notes (Signed)
S: postop from RoboHyst/bilateral salpingectomy 10/14/18. Some postop pain. Asking for refills but states pain is diffuse/body aches and no specific location. Denies fever. No N/V. Pathology showed no residual CA or dysplasia. O:  Vitals:   10/24/18 1452  BP: (!) 143/83  Pulse: 84  Resp: 20  Temp: 98.8 F (37.1 C)  SpO2: 100%   General :  Well developed, 48 y.o., female in no apparent distress HEENT:  Normocephalic/atraumatic, symmetric, EOMI, eyelids normal Neck:   No visible masses.  Respiratory:  Respirations unlabored, no use of accessory muscles CV:   Deferred Breast:  Deferred Musculoskeletal: Normal muscle strength. Abdomen:  Trocar sites healing with Dermabond in place No visible masses or protrusion Extremities:  No visible edema or deformities Skin:   Normal inspection Neuro/Psych:  No focal motor deficit, no abnormal mental status. Normal gait. Normal affect. Alert and oriented to person, place, and time   A/P Activity restrictions discussed Return in one month for vaginal cuff check Refill on pain meds #10; encouraged non-narcotic medication and discussion with PCP about pain  Cc: Forrest Moron, MD (PCP) Donalynn Furlong, MD (Referring Ob/Gyn)

## 2018-10-24 NOTE — Patient Instructions (Signed)
Return to see Dr. Gerarda Fraction for a vaginal cuff check in one month Activity restrictions as discussed Try Advil or Motrin for your body aches. Discuss these with your PCP.

## 2018-10-25 ENCOUNTER — Encounter: Payer: Self-pay | Admitting: Family Medicine

## 2018-11-01 ENCOUNTER — Other Ambulatory Visit: Payer: Self-pay

## 2018-11-01 ENCOUNTER — Encounter: Payer: Self-pay | Admitting: Family Medicine

## 2018-11-01 ENCOUNTER — Ambulatory Visit (INDEPENDENT_AMBULATORY_CARE_PROVIDER_SITE_OTHER): Payer: 59 | Admitting: Family Medicine

## 2018-11-01 VITALS — BP 131/83 | HR 87 | Temp 97.7°F | Resp 16 | Ht 67.0 in | Wt 269.0 lb

## 2018-11-01 DIAGNOSIS — Z09 Encounter for follow-up examination after completed treatment for conditions other than malignant neoplasm: Secondary | ICD-10-CM

## 2018-11-01 DIAGNOSIS — M2550 Pain in unspecified joint: Secondary | ICD-10-CM | POA: Diagnosis not present

## 2018-11-01 DIAGNOSIS — Z90710 Acquired absence of both cervix and uterus: Secondary | ICD-10-CM

## 2018-11-01 DIAGNOSIS — R0602 Shortness of breath: Secondary | ICD-10-CM | POA: Diagnosis not present

## 2018-11-01 DIAGNOSIS — I10 Essential (primary) hypertension: Secondary | ICD-10-CM

## 2018-11-01 DIAGNOSIS — Z6841 Body Mass Index (BMI) 40.0 and over, adult: Secondary | ICD-10-CM

## 2018-11-01 DIAGNOSIS — Z90721 Acquired absence of ovaries, unilateral: Secondary | ICD-10-CM

## 2018-11-01 DIAGNOSIS — E66813 Obesity, class 3: Secondary | ICD-10-CM

## 2018-11-01 MED ORDER — ALBUTEROL SULFATE HFA 108 (90 BASE) MCG/ACT IN AERS: 1.0000 | INHALATION_SPRAY | Freq: Four times a day (QID) | RESPIRATORY_TRACT | 3 refills | Status: DC | PRN

## 2018-11-01 MED ORDER — ACETAMINOPHEN 500 MG PO TABS: 500.0000 mg | ORAL_TABLET | Freq: Four times a day (QID) | ORAL | 2 refills | Status: DC | PRN

## 2018-11-01 NOTE — Progress Notes (Signed)
Established Patient Office Visit  Subjective:  Patient ID: Kayla Carey, female    DOB: May 29, 1971  Age: 48 y.o. MRN: [id number]  CC:  Chief Complaint  Patient presents with  . Fatigue    pt states she has been having bodyaches and low energy.   . Chest Discomfort    HPI Kayla Carey is a 48 year old female who presents for increase joint pains. Since her last office she is s/p: Hysterectomy on 10/14/2018. She is doing well, but reports mild pain is in her back and hands since procedure. She is unable to mover her hands in the mornings without stretching first. Pain slowly improving. She is also experiencing mild intermittent shortness of breath on exertion andchest discomfort, which she feels may be GERD. No chest pain, heart palpitations, cough reported. She denies fevers, chills, fatigue, recent infections, weight loss, and night sweats. She has not had any headaches, visual changes, dizziness, and falls.  No reports of GI problems such as nausea, vomiting, diarrhea, and constipation. She has no reports of blood in stools, dysuria and hematuria. No depression or anxiety reported.   Past Medical History:  Diagnosis Date  . Cervical cancer [facility])    gyn oncologist-  dr Gerarda Fraction--  bx 08-25-2018  ,  invasive SCC, Stage IA1  . Chronic diastolic (congestive) heart failure [facility])    cardiologist-  dr Bettina Gavia  . GERD (gastroesophageal reflux disease)    per pt just drinks water  . Hypertension   . IDA (iron deficiency anemia)     Past Surgical History:  Procedure Laterality Date  . BREAST SURGERY Left age 48   Lumpectomy-benign  . CERVICAL CONIZATION W/BX N/A 08/25/2018   Procedure: CONIZATION CERVIX WITH BIOPSY AND ENDOCERVICAL CURETTAGE;  Surgeon: Isabel Caprice, MD;  Location: [facility];  Service: Gynecology;  Laterality: N/A;  . LEFT HEART CATH AND CORONARY ANGIOGRAPHY N/A 08/13/2018   Procedure: LEFT HEART CATH AND CORONARY ANGIOGRAPHY;  Surgeon: Troy Sine, MD;  Location: [facility];  Service: Cardiovascular;  Laterality: N/A;    Family History  Problem Relation Age of Onset  . Hypertension Mother   . Congestive Heart Failure Mother   . Hypertension Father   . Diabetes Father   . Cancer Father        Prostate  . Kidney cancer Son        son died from disease    Social History   Socioeconomic History  . Marital status: Single    Spouse name: Not on file  . Number of children: Not on file  . Years of education: Not on file  . Highest education level: Not on file  Occupational History  . Not on file  Social Needs  . Financial resource strain: Not on file  . Food insecurity:    Worry: Not on file    Inability: Not on file  . Transportation needs:    Medical: Not on file    Non-medical: Not on file  Tobacco Use  . Smoking status: Former Smoker    Packs/day: 0.50    Years: 20.00    Pack years: 10.00    Types: Cigarettes    Last attempt to quit: 07/15/2018    Years since quitting: 0.2  . Smokeless tobacco: Never Used  Substance and Sexual Activity  . Alcohol use: Not Currently  . Drug use: Never  . Sexual activity: Not Currently    Comment: 1st intercourse 48 yo-More than  5 partners  Lifestyle  . Physical activity:    Days per week: Not on file    Minutes per session: Not on file  . Stress: Not on file  Relationships  . Social connections:    Talks on phone: Not on file    Gets together: Not on file    Attends religious service: Not on file    Active member of club or organization: Not on file    Attends meetings of clubs or organizations: Not on file    Relationship status: Not on file  . Intimate partner violence:    Fear of current or ex partner: Not on file    Emotionally abused: Not on file    Physically abused: Not on file    Forced sexual activity: Not on file  Other Topics Concern  . Not on file  Social History Narrative  . Not on file    Outpatient Medications Prior to Visit    Medication Sig Dispense Refill  . amLODipine (NORVASC) 5 MG tablet Take 5 mg by mouth every evening.     . Ascorbic Acid (VITAMIN C) 1000 MG tablet Take 1,000 mg by mouth daily.     . Cholecalciferol (VITAMIN D3) 2000 units capsule Take 2,000 Units by mouth daily.    . ferrous sulfate 325 (65 FE) MG tablet Take 325 mg by mouth daily.     . furosemide (LASIX) 40 MG tablet Take 1 tablet (40 mg total) by mouth 2 (two) times daily. Take 1 extra tablet on Monday, Wednesday, Friday. 216 tablet 1  . hydrALAZINE (APRESOLINE) 25 MG tablet Take 1.5 tablets (37.5 mg total) by mouth 3 (three) times daily. 405 tablet 1  . ibuprofen (ADVIL,MOTRIN) 800 MG tablet Take 1 tablet (800 mg total) by mouth every 8 (eight) hours as needed. (Patient taking differently: Take 800 mg by mouth every 8 (eight) hours as needed (PAIN). ) 30 tablet 0  . losartan-hydrochlorothiazide (HYZAAR) 100-12.5 MG tablet Take 1 tablet by mouth every morning.     . metoprolol succinate (TOPROL-XL) 25 MG 24 hr tablet TAKE 1 TABLET BY MOUTH ONCE EVERY DAY    . metoprolol tartrate (LOPRESSOR) 25 MG tablet Take 25 mg by mouth every morning.     . potassium chloride (K-DUR) 10 MEQ tablet Take 10 mEq by mouth every morning.     . traMADol (ULTRAM) 50 MG tablet TK 1 TO 2 TS PO Q 6 H    . albuterol (PROVENTIL HFA;VENTOLIN HFA) 108 (90 Base) MCG/ACT inhaler Inhale 1 puff into the lungs every 6 (six) hours as needed for wheezing or shortness of breath.     Marland Kitchen HYDROcodone-acetaminophen (NORCO) 5-325 MG tablet Take 1 tablet by mouth every 6 (six) hours as needed for moderate pain. 10 tablet 0   No facility-administered medications prior to visit.     No Known Allergies  ROS Review of Systems  Respiratory: Positive for shortness of breath (Occasional ).   Musculoskeletal: Positive for arthralgias (shoulders, arms, wrists) and back pain.  Neurological: Positive for dizziness (Occasional) and headaches [facility]).   Objective:    Physical  Exam  BP 131/83   Pulse 87   Temp 97.7 F (36.5 C) (Oral)   Resp 16   Ht 5\' 7"  (1.702 m)   Wt 269 lb (122 kg)   LMP  (LMP Unknown) Comment: urine preg.test negative  SpO2 96%   BMI 42.13 kg/m  Wt Readings from Last 3 Encounters:  11/01/18 269 lb (  122 kg)  10/24/18 264 lb 14.4 oz (120.2 kg)  10/14/18 260 lb (117.9 kg)     Health Maintenance Due  Topic Date Due  . HIV Screening  04/16/1986  . TETANUS/TDAP  04/16/1990  . INFLUENZA VACCINE  05/15/2018    There are no preventive care reminders to display for this patient.  No results found for: TSH Lab Results  Component Value Date   WBC 7.5 10/15/2018   HGB 13.1 10/15/2018   HCT 40.2 10/15/2018   MCV 92.4 10/15/2018   PLT 268 10/15/2018   Lab Results  Component Value Date   NA 138 10/15/2018   K 4.0 10/15/2018   CO2 28 10/15/2018   GLUCOSE 107 (H) 10/15/2018   BUN 11 10/15/2018   CREATININE 0.63 10/15/2018   BILITOT 0.6 10/03/2018   ALKPHOS 55 10/03/2018   AST 29 10/03/2018   ALT 23 10/03/2018   PROT 7.2 10/03/2018   ALBUMIN 4.5 10/03/2018   CALCIUM 9.1 10/15/2018   ANIONGAP 9 10/15/2018   Lab Results  Component Value Date   CHOL 184 05/27/2018   Lab Results  Component Value Date   HDL 48 05/27/2018   Lab Results  Component Value Date   LDLCALC 125 (H) 05/27/2018   Lab Results  Component Value Date   TRIG 54 05/27/2018   Lab Results  Component Value Date   CHOLHDL 3.8 05/27/2018   Lab Results  Component Value Date   HGBA1C 5.4 05/27/2018      Assessment & Plan:   1. Arthralgia, unspecified joint Increased joint pain post hysterectomy on 10/14/2018. We will initiate Acetaminophen to aid in pain relief.  - acetaminophen (TYLENOL) 500 MG tablet; Take 1 tablet (500 mg total) by mouth every 6 (six) hours as needed.  Dispense: 30 tablet; Refill: 2  2. S/p: hysterectomy with oophorectomy She is doing well with mild pain, which is improving.   3. Shortness of breath Stable today. No signs  and symptoms of respiratory distress noted or reported. Continue Albuterol inhaler as needed.  - albuterol (PROVENTIL HFA;VENTOLIN HFA) 108 (90 Base) MCG/ACT inhaler; Inhale 1 puff into the lungs every 6 (six) hours as needed for wheezing or shortness of breath.  Dispense: 8 Inhaler; Refill: 3  3. Malignant hypertension Hypertensive medications are effective. Blood pressure is 131/83 today. Continue Amlodipine, Hydralazine, Losartan-HCTZ, and Metoprolol as prescribed. She will continue to decrease high sodium intake, excessive alcohol intake, increase potassium intake, smoking cessation, and increase physical activity of at least 30 minutes of cardio activity daily. She will continue to follow Heart Healthy or DASH diet.  4. Class 3 severe obesity with serious comorbidity and body mass index (BMI) of 40.0 to 44.9 in adult, unspecified obesity type (HCC) Body mass index is 42.13 kg/m. Goal BMI  is <30. Encouraged efforts to reduce weight include engaging in physical activity as tolerated with goal of 150 minutes per week. Improve dietary choices and eat a meal regimen consistent with a Mediterranean or DASH diet. Reduce simple carbohydrates. Do not skip meals and eat healthy snacks throughout the day to avoid over-eating at dinner. Set a goal weight loss that is achievable for you.  5. Follow up She will follow up in 3 months.   Meds ordered this encounter  Medications  . acetaminophen (TYLENOL) 500 MG tablet    Sig: Take 1 tablet (500 mg total) by mouth every 6 (six) hours as needed.    Dispense:  30 tablet    Refill:  2  .  albuterol (PROVENTIL HFA;VENTOLIN HFA) 108 (90 Base) MCG/ACT inhaler    Sig: Inhale 1 puff into the lungs every 6 (six) hours as needed for wheezing or shortness of breath.    Dispense:  8 Inhaler    Refill:  [place],  MSN, FNP-C [facility] [address] [place], [place] [address] [phone]   Problem List Items  Addressed This Visit    None    Visit Diagnoses    Arthralgia, unspecified joint    -  Primary   Relevant Medications   acetaminophen (TYLENOL) 500 MG tablet   Shortness of breath       Relevant Medications   albuterol (PROVENTIL HFA;VENTOLIN HFA) 108 (90 Base) MCG/ACT inhaler   Malignant hypertension       Relevant Medications   metoprolol succinate (TOPROL-XL) 25 MG 24 hr tablet   Class 3 severe obesity with serious comorbidity and body mass index (BMI) of 40.0 to 44.9 in adult, unspecified obesity type ([place])       Follow up          Meds ordered this encounter  Medications  . acetaminophen (TYLENOL) 500 MG tablet    Sig: Take 1 tablet (500 mg total) by mouth every 6 (six) hours as needed.    Dispense:  30 tablet    Refill:  2  . albuterol (PROVENTIL HFA;VENTOLIN HFA) 108 (90 Base) MCG/ACT inhaler    Sig: Inhale 1 puff into the lungs every 6 (six) hours as needed for wheezing or shortness of breath.    Dispense:  8 Inhaler    Refill:  3    Follow-up: Return in about 3 months (around 01/31/2019).    Azzie Glatter, FNP

## 2018-11-01 NOTE — Patient Instructions (Signed)
° ° ° °  If you have lab work done today you will be contacted with your lab results within the next 2 weeks.  If you have not heard from us then please contact us. The fastest way to get your results is to register for My Chart. ° ° °IF you received an x-ray today, you will receive an invoice from [facility]. Please contact [facility] at [phone] with questions or concerns regarding your invoice.  ° °IF you received labwork today, you will receive an invoice from [facility]. Please contact [facility] at [phone] with questions or concerns regarding your invoice.  ° °Our billing staff will not be able to assist you with questions regarding bills from these companies. ° °You will be contacted with the lab results as soon as they are available. The fastest way to get your results is to activate your My Chart account. Instructions are located on the last page of this paperwork. If you have not heard from us regarding the results in 2 weeks, please contact this office. °  ° ° ° °

## 2018-11-03 NOTE — Addendum Note (Signed)
Addended by: Azzie Glatter on: 11/03/2018 09:43 PM   Modules accepted: Level of Service

## 2018-11-07 ENCOUNTER — Telehealth: Payer: Self-pay

## 2018-11-07 NOTE — Telephone Encounter (Signed)
Letter sent via mail to pt home address. Dgaddy, CMA

## 2018-11-17 ENCOUNTER — Telehealth: Payer: Self-pay | Admitting: *Deleted

## 2018-11-17 NOTE — Telephone Encounter (Signed)
Pharmacy called to inform us that they are on back order for Lasartin-HCTZ and so are giving pt's the medication split up. Pt will be given Losartin 100mg  and HCTZ 12.5.

## 2018-11-24 ENCOUNTER — Encounter: Payer: Self-pay | Admitting: Gynecologic Oncology

## 2018-11-24 ENCOUNTER — Encounter: Payer: Self-pay | Admitting: Obstetrics

## 2018-11-24 ENCOUNTER — Inpatient Hospital Stay: Payer: 59 | Attending: Obstetrics | Admitting: Obstetrics

## 2018-11-24 VITALS — BP 127/80 | HR 93 | Temp 98.2°F | Resp 20 | Ht 67.0 in | Wt 278.3 lb

## 2018-11-24 DIAGNOSIS — M545 Low back pain, unspecified: Secondary | ICD-10-CM

## 2018-11-24 DIAGNOSIS — Z90710 Acquired absence of both cervix and uterus: Secondary | ICD-10-CM

## 2018-11-24 DIAGNOSIS — C539 Malignant neoplasm of cervix uteri, unspecified: Secondary | ICD-10-CM | POA: Insufficient documentation

## 2018-11-24 DIAGNOSIS — Z90722 Acquired absence of ovaries, bilateral: Secondary | ICD-10-CM | POA: Insufficient documentation

## 2018-11-24 MED ORDER — IBUPROFEN 800 MG PO TABS: 800.0000 mg | ORAL_TABLET | Freq: Three times a day (TID) | ORAL | 0 refills | Status: DC | PRN

## 2018-11-24 NOTE — Progress Notes (Signed)
[place] [facility]   Progress Note: Established Patient Follow-Up VISIT   Consult was originally requested by Dr. Donalynn Furlong for a cervical biopsy showing SCCa   Chief Complaint  Patient presents with  . Malignant neoplasm of cervix, unspecified site [facility])    GYN Oncologic Summary 1. Stage IA1 SCCa Cervix; neg LVSI o 08/25/18 Cervical CKConization o 10/14/18 - RoboHyst/Bilateral Salpingectomy - no residual CA or dysplasia  HPI: [name]  is a very nice 48 y.o.  P3   Interval History  She is here today for a vaginal cuff check. She has significant low back pain, midline. Thinks worsening since surgery. No N/V. No dysuria. No fever.   Oncologic History She was seen for routine exam after living in [place] and receiving most of her care there. She had a screening Pap by her PCP. 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 She was referred to Gynecology for further workup.  07/15/18 Colposcopy with biopsies revealed: 1. 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 -NO INVASIVE NEOPLASM IDENTIFIED. -SEE COMMENT. 2. Cervix, biopsy, 3 o'clock -CERVICAL TRANSFORMATION ZONE MUCOSA WITH CIN-III (SEVERE SQUAMOUS DYSPLASIA/SQUAMOUS CARCINOMA IN SITU; HIGH GRADE SQUAMOUS INTRAEPITHELIAL LESION). -NO INVASIVE NEOPLASM IDENTIFIED. -SEE COMMENT. 3. Cervix, biopsy, 6 o'clock -FRAGMENTED SQUAMOUS MUCOSA WITH CIN-III (SEVERE SQUAMOUS DYSPLASIA/SQUAMOUS CARCINOMA IN SITU; HIGH GRADE SQUAMOUS INTRAEPITHELIAL LESION). -CANNOT RULE OUT HIGHER GRADE LESION. 4. Cervix, biopsy, 9 o'clock -FOCI OF SUBMUCOSAL INVASIVE SQUAMOUS CELL CARCINOMA IN A SETTING OF FRAGMENTED CERVICAL TRANSFORMATION ZONE MUCOSA WITH CIN-III (SEVERE SQUAMOUS DYSPLASIA/SQUAMOUS CARCINOMA IN SITU; HIGH GRADE SQUAMOUS INTRAEPITHELIAL LESION). -SEE COMMENT. The  portion of the endocervical stroma containing invasive squamous cell carcinoma is heavily inflamed, unoriented and devoid of surface mucosa. In this setting, the depth of invasion cannot be accurately determined. 5. Endocervix, curettage - FRAGMENTED CERVICAL TRANSFORMATION ZONE MUCOSA WITH CIN-III (SEVERE SQUAMOUS DYSPLASIA/SQUAMOUS CARCINOMA IN SITU; HIGH GRADE SQUAMOUS INTRAEPITHELIAL LESION). - CERVICAL STROMA NOT PRESENT FOR EVALUATION. - SEE COMMENT.  Patient denied any post-coital bleeding, but it is hard to get a history whether she has had intercourse in the last 6 months. Her menses have been spacing out and she has noted no increased vaginal bleeding. She has noticed some spotting since the biopsies done a few days ago. Occasional sharp right sided pain but it is fleeting. Denies N/V.  On 08/25/18 I took her for a cervical conization. Final pathology revealed: 1. Cervix, cone - INVASIVE SQUAMOUS CELL CARCINOMA, WELL-DIFFERENTIATED. - TUMOR SPANS 3 MM IN WIDTH, 2 MM IN LENGTH, AND 2 MM IN DEPTH. - BACKGROUND HIGH GRADE SQUAMOUS INTRAEPITHELIAL LESION, CIN-III (SEVERE DYSPLASIA/CIS) WITH ENDOCERVICAL GLAND INVOLVEMENT. - MARGINS ARE NEGATIVE FOR INVASIVE CARCINOMA. - CIN-III EXTENDS TO ENDOCERVICAL MARGIN BROADLY. - SEE COMMENT. 2. Endocervix, curettage - DETACHED FRAGMENTS OF AT LEAST HIGH GRADE SQUAMOUS INTRAEPITHELIAL LESION, CIN-III (SEVERE DYSPLASIA/CIS). - SEE COMMENT. Microscopic Comment 1. The invasive carcinoma is located at roughly 10:00 position and is 2 mm from the ectocervical margin. CIN-III is present at the endocervical margin from roughly 12:00-3:00. An oncology table is not required for cervical cone excisions. 2. Fragments of at least CIN-III are detached and not associated with stroma, thus invasion can not be assessed. 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  I  then took her for RoboHyst/bilateral salpingectomy 10/14/18. -- Pathology showed  no residual CA or dysplasia.  Imported EPIC Oncologic History:   No history exists.    Measurement of disease: Will be clinical exam and Pap given her low Stage disease . Marland Kitchen  Radiology: No results found.  Outpatient Encounter Medications as of 11/24/2018  Medication Sig  . acetaminophen (TYLENOL) 500 MG tablet Take 1 tablet (500 mg total) by mouth every 6 (six) hours as needed.  Marland Kitchen albuterol (PROVENTIL HFA;VENTOLIN HFA) 108 (90 Base) MCG/ACT inhaler Inhale 1 puff into the lungs every 6 (six) hours as needed for wheezing or shortness of breath.  Marland Kitchen amLODipine (NORVASC) 5 MG tablet Take 5 mg by mouth every evening.   . Ascorbic Acid (VITAMIN C) 1000 MG tablet Take 1,000 mg by mouth daily.   . Cholecalciferol (VITAMIN D3) 2000 units capsule Take 2,000 Units by mouth daily.  . ferrous sulfate 325 (65 FE) MG tablet Take 325 mg by mouth daily.   . furosemide (LASIX) 40 MG tablet Take 1 tablet (40 mg total) by mouth 2 (two) times daily. Take 1 extra tablet on Monday, Wednesday, Friday.  . hydrALAZINE (APRESOLINE) 25 MG tablet Take 1.5 tablets (37.5 mg total) by mouth 3 (three) times daily.  Marland Kitchen ibuprofen (ADVIL,MOTRIN) 800 MG tablet Take 1 tablet (800 mg total) by mouth every 8 (eight) hours as needed. (Patient taking differently: Take 800 mg by mouth every 8 (eight) hours as needed (PAIN). )  . losartan-hydrochlorothiazide (HYZAAR) 100-12.5 MG tablet Take 1 tablet by mouth every morning.   . metoprolol succinate (TOPROL-XL) 25 MG 24 hr tablet TAKE 1 TABLET BY MOUTH ONCE EVERY DAY  . metoprolol tartrate (LOPRESSOR) 25 MG tablet Take 25 mg by mouth every morning.   . potassium chloride (K-DUR) 10 MEQ tablet Take 10 mEq by mouth every morning.   . [DISCONTINUED] albuterol (PROVENTIL HFA;VENTOLIN HFA) 108 (90 Base) MCG/ACT inhaler Inhale 1 puff into the lungs every 6 (six) hours as needed for wheezing or shortness of breath.    . [DISCONTINUED] HYDROcodone-acetaminophen (NORCO) 5-325 MG tablet Take 1 tablet by mouth every 6 (six) hours as needed for moderate pain.  . [DISCONTINUED] traMADol (ULTRAM) 50 MG tablet TK 1 TO 2 TS PO Q 6 H   No facility-administered encounter medications on file as of 11/24/2018.    No Known Allergies  Past Medical History:  Diagnosis Date  . Cervical cancer [facility])    gyn oncologist-  dr Gerarda Fraction--  bx 08-25-2018  ,  invasive SCC, Stage IA1  . Chronic diastolic (congestive) heart failure [facility])    cardiologist-  dr Bettina Gavia  . GERD (gastroesophageal reflux disease)    per pt just drinks water  . Hypertension   . IDA (iron deficiency anemia)    Past Surgical History:  Procedure Laterality Date  . BREAST SURGERY Left age 70   Lumpectomy-benign  . CERVICAL CONIZATION W/BX N/A 08/25/2018   Procedure: CONIZATION CERVIX WITH BIOPSY AND ENDOCERVICAL CURETTAGE;  Surgeon: Isabel Caprice, MD;  Location: [facility];  Service: Gynecology;  Laterality: N/A;  . LEFT HEART CATH AND CORONARY ANGIOGRAPHY N/A 08/13/2018   Procedure: LEFT HEART CATH AND CORONARY ANGIOGRAPHY;  Surgeon: Troy Sine, MD;  Location: [facility];  Service: Cardiovascular;  Laterality: N/A;        Past Gynecological History:   GYNECOLOGIC HISTORY:  . No LMP recorded. (Menstrual status: Irregular Periods).  . Menarche: 48 years old . P 3 . Contraceptive None . HRT NA  . Last Pap ?never  before workup Family Hx:  Family History  Problem Relation Age of Onset  . Hypertension Mother   . Congestive Heart Failure Mother   . Hypertension Father   . Diabetes Father   . Cancer Father        Prostate  . Kidney cancer Son        son died from disease   Social Hx:  Marland Kitchen Tobacco use: quit ~2 weeks ago . Alcohol use: 1 drink per day . Illicit Drug use: none . Illicit IV Drug use: none    Review of Systems: Review of Systems  Constitutional: Positive for fatigue.  Musculoskeletal: Positive  for back pain.  All other systems reviewed and are negative.  Vitals:   Vitals:   11/24/18 1540  Weight: 278 lb 4.8 oz (126.2 kg)  Height: 5\' 7"  (1.702 m)   Body mass index is 43.59 kg/m.  Physical Exam: General :  Well developed, 48 y.o., female in no apparent distress HEENT:  Normocephalic/atraumatic, symmetric, EOMI, eyelids normal Neck:   Supple, no masses.  Lymphatics:  No cervical/ submandibular/ supraclavicular/ infraclavicular/ inguinal adenopathy Respiratory:  Respirations unlabored, no use of accessory muscles CV:   Deferred Breast:  Deferred Musculoskeletal: No CVA tenderness, normal muscle strength. Abdomen:  Soft, non-tender and nondistended. No evidence of hernia. No masses. Extremities:  No lymphedema, no erythema, non-tender. Skin:   Normal inspection Neuro/Psych:  No focal motor deficit, no abnormal mental status. Normal gait. Normal affect. Alert and oriented to person, place, and time  Genito Urinary: Vulva: Normal external female genitalia.  Bladder/urethra: Urethral meatus normal in size and location. No lesions or   masses, well supported bladder Speculum exam: Vagina: No lesion, no discharge, no bleeding.  cuff intact. Bimanual exam: Cervix/Uterus: Surgically absent  Adnexal region: No masses. Rectovaginal:  Deferred    Assessment  Stage IA1 SCCa Cervix  Plan  1. Surgical pathology was reviewed previously and she understands this is cancer, but luckily seems to have been caught early. ? She was given a copy of the reports for her records 2. Management ? Given this is Stage IA1 with no LVSI, per NCCN guidelines she was a candidate for a simple hysterectomy ? The specimen saw no residual disease 3. I discussed HPV as etiology and offered additional STD testing. She declines at this time 4. Back pain ? I will order a renal ultrasound. If negative defer to PCP for further workup.   Isabel Caprice, MD  11/24/2018, 4:00 PM    Cc: Donalynn Furlong, MD (Referring Ob/Gyn) - not 10/03/18 Forrest Moron, MD (PCP)

## 2018-11-24 NOTE — Addendum Note (Signed)
Addended by: Joylene John D on: 11/24/2018 04:14 PM   Modules accepted: Orders

## 2018-11-24 NOTE — Patient Instructions (Signed)
We will obtain a renal ultrasound and let you know the results Return in 3 months for a pelvic  Return to work 11/25/18 with restrictions of no lifting, pulling, pushing >5 pounds. May return to full activity 12/09/18.    Nothing per vagina as instructed by Dr. Gerarda Fraction

## 2018-11-27 ENCOUNTER — Ambulatory Visit (HOSPITAL_COMMUNITY): Payer: 59

## 2018-11-28 ENCOUNTER — Telehealth: Payer: Self-pay | Admitting: *Deleted

## 2018-11-28 NOTE — Telephone Encounter (Signed)
Called and spoke with the patient regarding her scan yesterday. The patient stated "I'm having problems with my insurance. Once I get that squared away I will reschedule." Asked the patient to call the office back once she reschedules the scan.

## 2018-12-11 ENCOUNTER — Ambulatory Visit (HOSPITAL_COMMUNITY)
Admission: RE | Admit: 2018-12-11 | Discharge: 2018-12-11 | Disposition: A | Discharge status: Home / Self Care | Payer: Self-pay | Source: Ambulatory Visit | Attending: Obstetrics | Admitting: Obstetrics

## 2018-12-11 DIAGNOSIS — M545 Low back pain, unspecified: Secondary | ICD-10-CM

## 2018-12-12 IMAGING — CR DG CHEST 2V
2 series · 2 of 2 positions shown · non-contrast
Comparison: 04/06/2013.

CLINICAL DATA: Cough.

EXAM:
CHEST - 2 VIEW

[w chest pa]
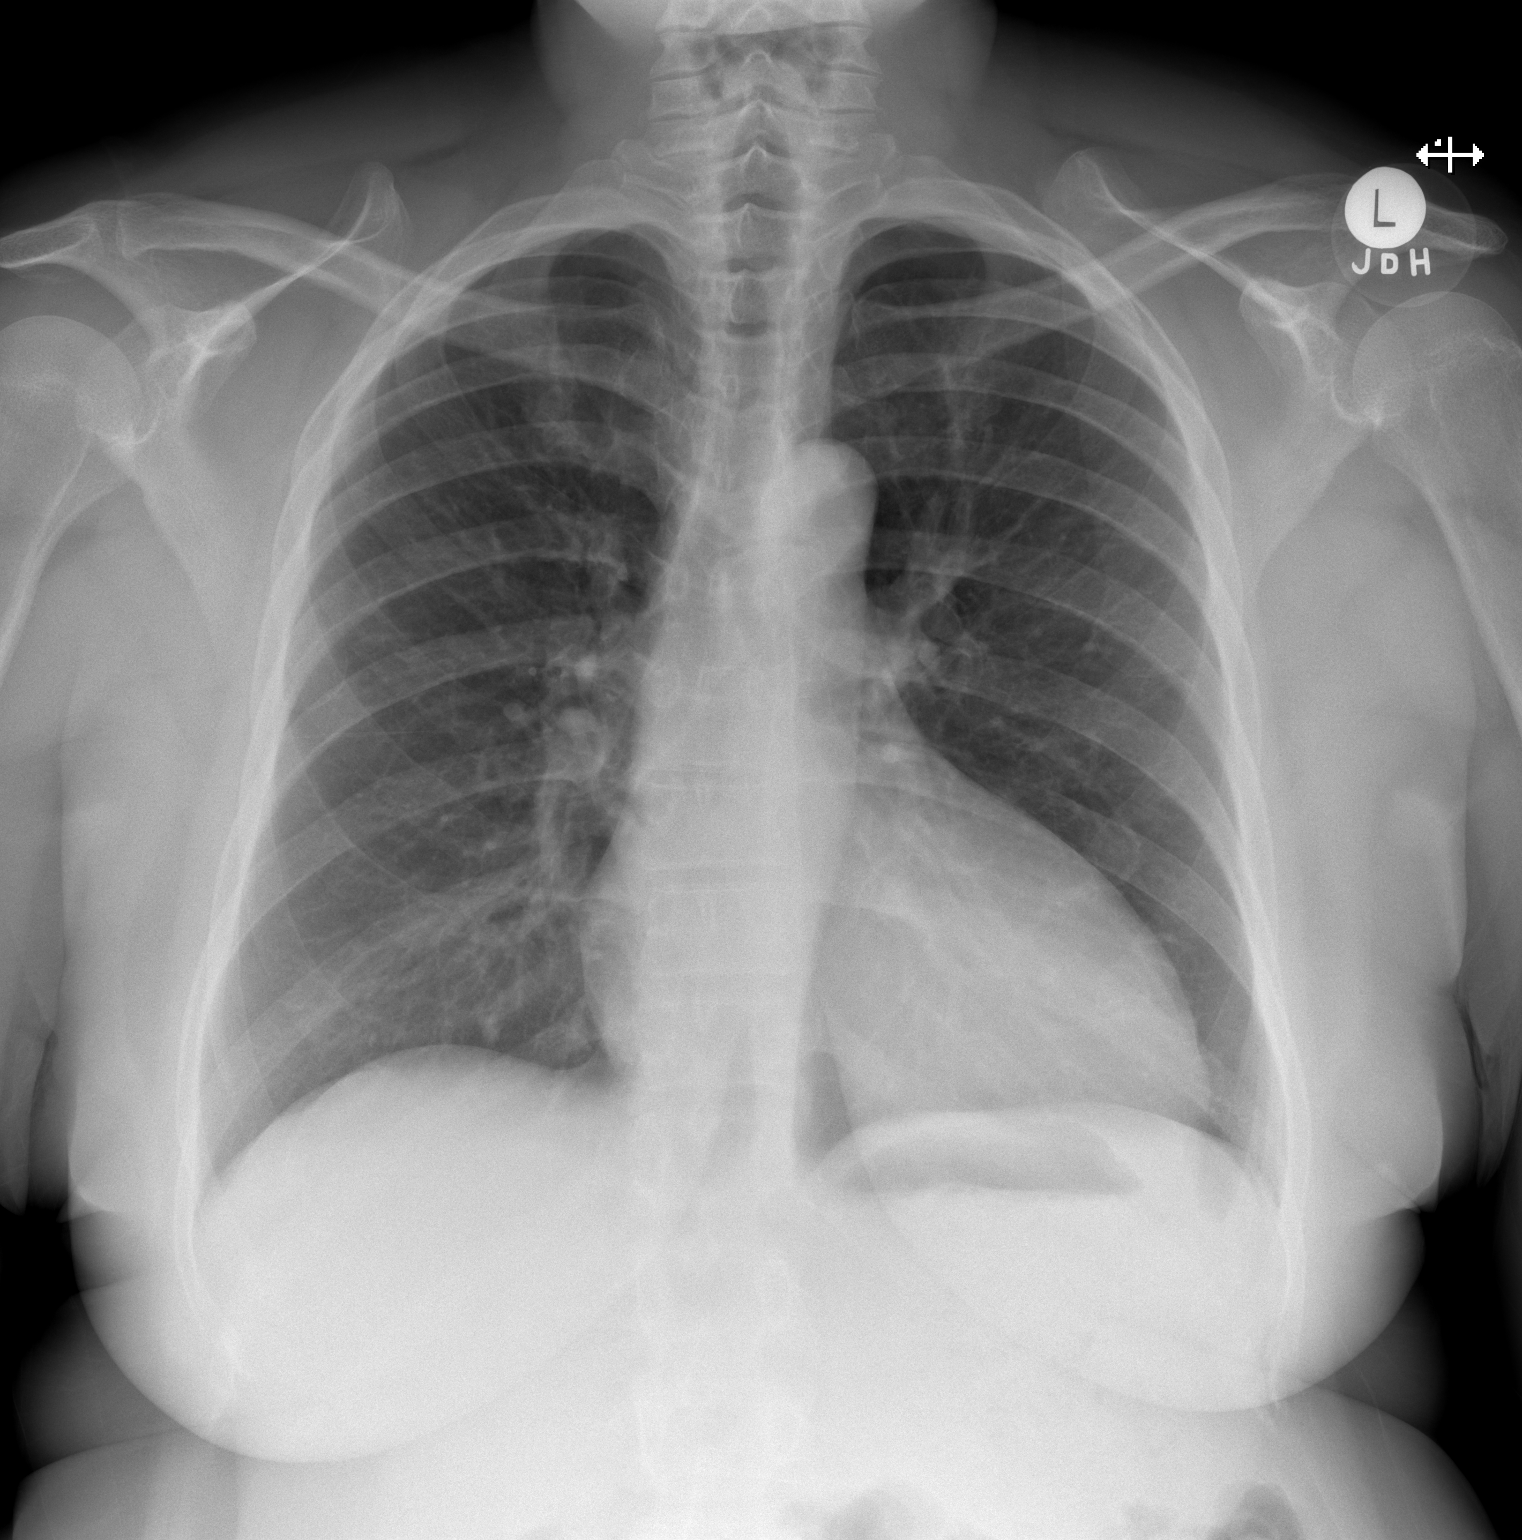

[w chest lat]
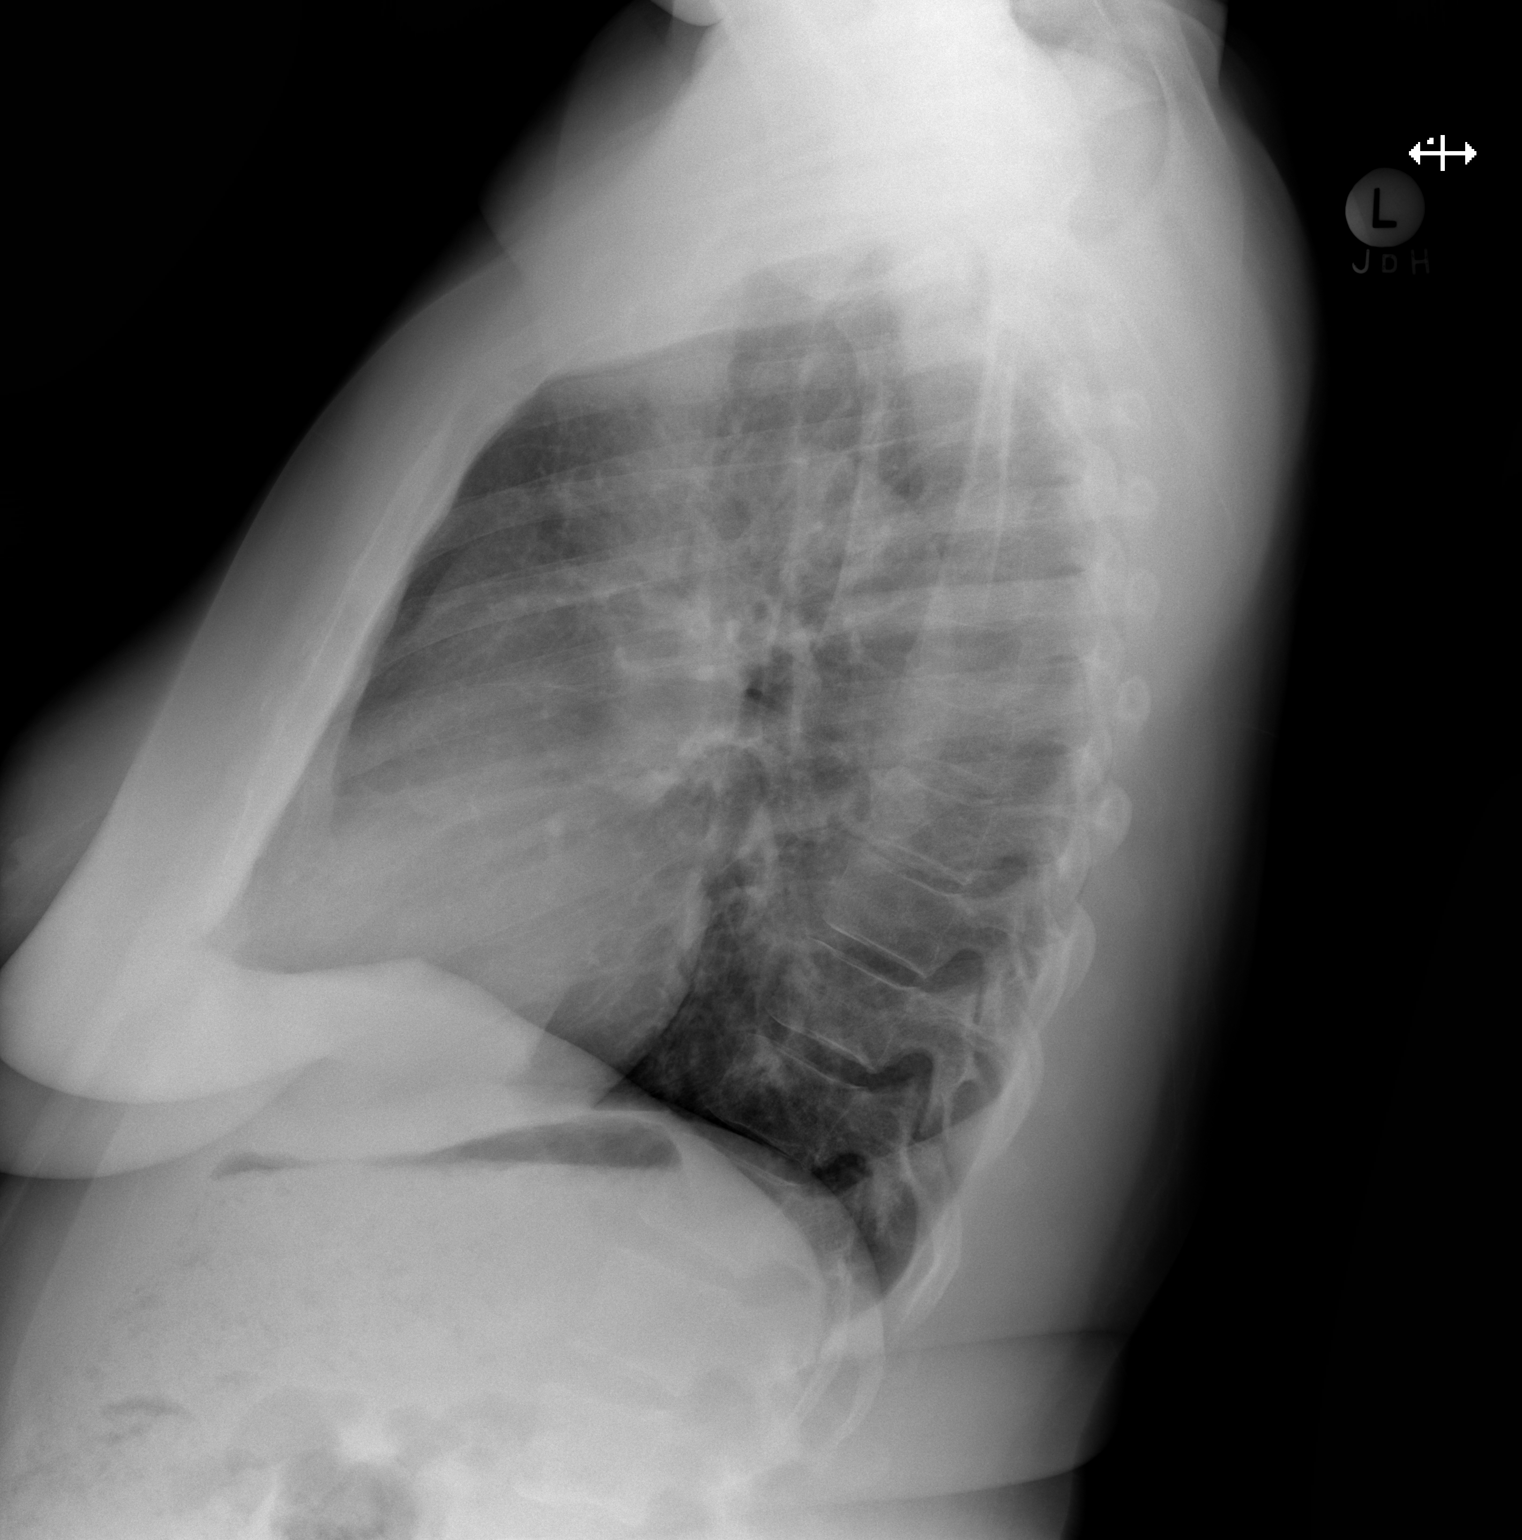

[2 of 2 positions shown; findings below may reference images not displayed]

FINDINGS: Mediastinum hilar structures normal. Lungs are clear. Stable
cardiomegaly. No pulmonary venous congestion. No acute bony
abnormality.
IMPRESSION: 1.  Stable cardiomegaly.  No pulmonary venous congestion.

2.  No acute pulmonary disease P

## 2018-12-16 ENCOUNTER — Telehealth: Payer: Self-pay

## 2018-12-16 NOTE — Telephone Encounter (Signed)
Told Kayla Carey that her ultrasound was normal per Joylene John, NP.  Dr. Gerarda Fraction stated in her note from 11-24-18 that if the renal US were negative, she would have Kayla Carey follow up with PCP for further work up of back pain. Pt verbalized understanding.

## 2018-12-31 ENCOUNTER — Ambulatory Visit: Payer: 59 | Admitting: Cardiology

## 2019-01-01 ENCOUNTER — Other Ambulatory Visit: Payer: Self-pay | Admitting: Cardiology

## 2019-02-10 ENCOUNTER — Telehealth: Payer: Self-pay | Admitting: Cardiology

## 2019-02-19 ENCOUNTER — Telehealth: Payer: Self-pay | Admitting: *Deleted

## 2019-02-19 NOTE — Telephone Encounter (Signed)
Called and spoke with the patient regarding her appt on 5/14. Patient needs to cancel appt due having no insurance. She will call back in June to schedule an appt on July.

## 2019-02-26 ENCOUNTER — Ambulatory Visit: Payer: 59 | Admitting: Gynecologic Oncology

## 2019-02-27 ENCOUNTER — Other Ambulatory Visit: Payer: Self-pay | Admitting: Cardiology

## 2019-03-02 ENCOUNTER — Other Ambulatory Visit: Payer: Self-pay | Admitting: Cardiology

## 2019-03-05 ENCOUNTER — Other Ambulatory Visit: Payer: Self-pay | Admitting: Cardiology

## 2019-03-17 ENCOUNTER — Telehealth: Payer: Self-pay

## 2019-03-17 NOTE — Telephone Encounter (Signed)
Left voice message requesting a call back to review and reconcile current medications

## 2019-03-30 ENCOUNTER — Other Ambulatory Visit: Payer: Self-pay | Admitting: Cardiology

## 2019-04-13 ENCOUNTER — Telehealth: Payer: Self-pay | Admitting: Cardiology

## 2019-05-26 ENCOUNTER — Other Ambulatory Visit: Payer: Self-pay | Admitting: Cardiology

## 2019-06-08 ENCOUNTER — Other Ambulatory Visit: Payer: Self-pay | Admitting: Cardiology

## 2019-07-03 ENCOUNTER — Other Ambulatory Visit: Payer: Self-pay | Admitting: Cardiology

## 2019-07-07 ENCOUNTER — Encounter: Payer: Self-pay | Admitting: Gynecology

## 2019-07-29 IMAGING — CR DG CHEST 2V
2 series · 2 of 2 positions shown · non-contrast
Comparison: December 25, 2017

CLINICAL DATA: Preoperative evaluation. History of cervical
carcinoma. Hypertension.

EXAM:
CHEST - 2 VIEW

[w chest pa]
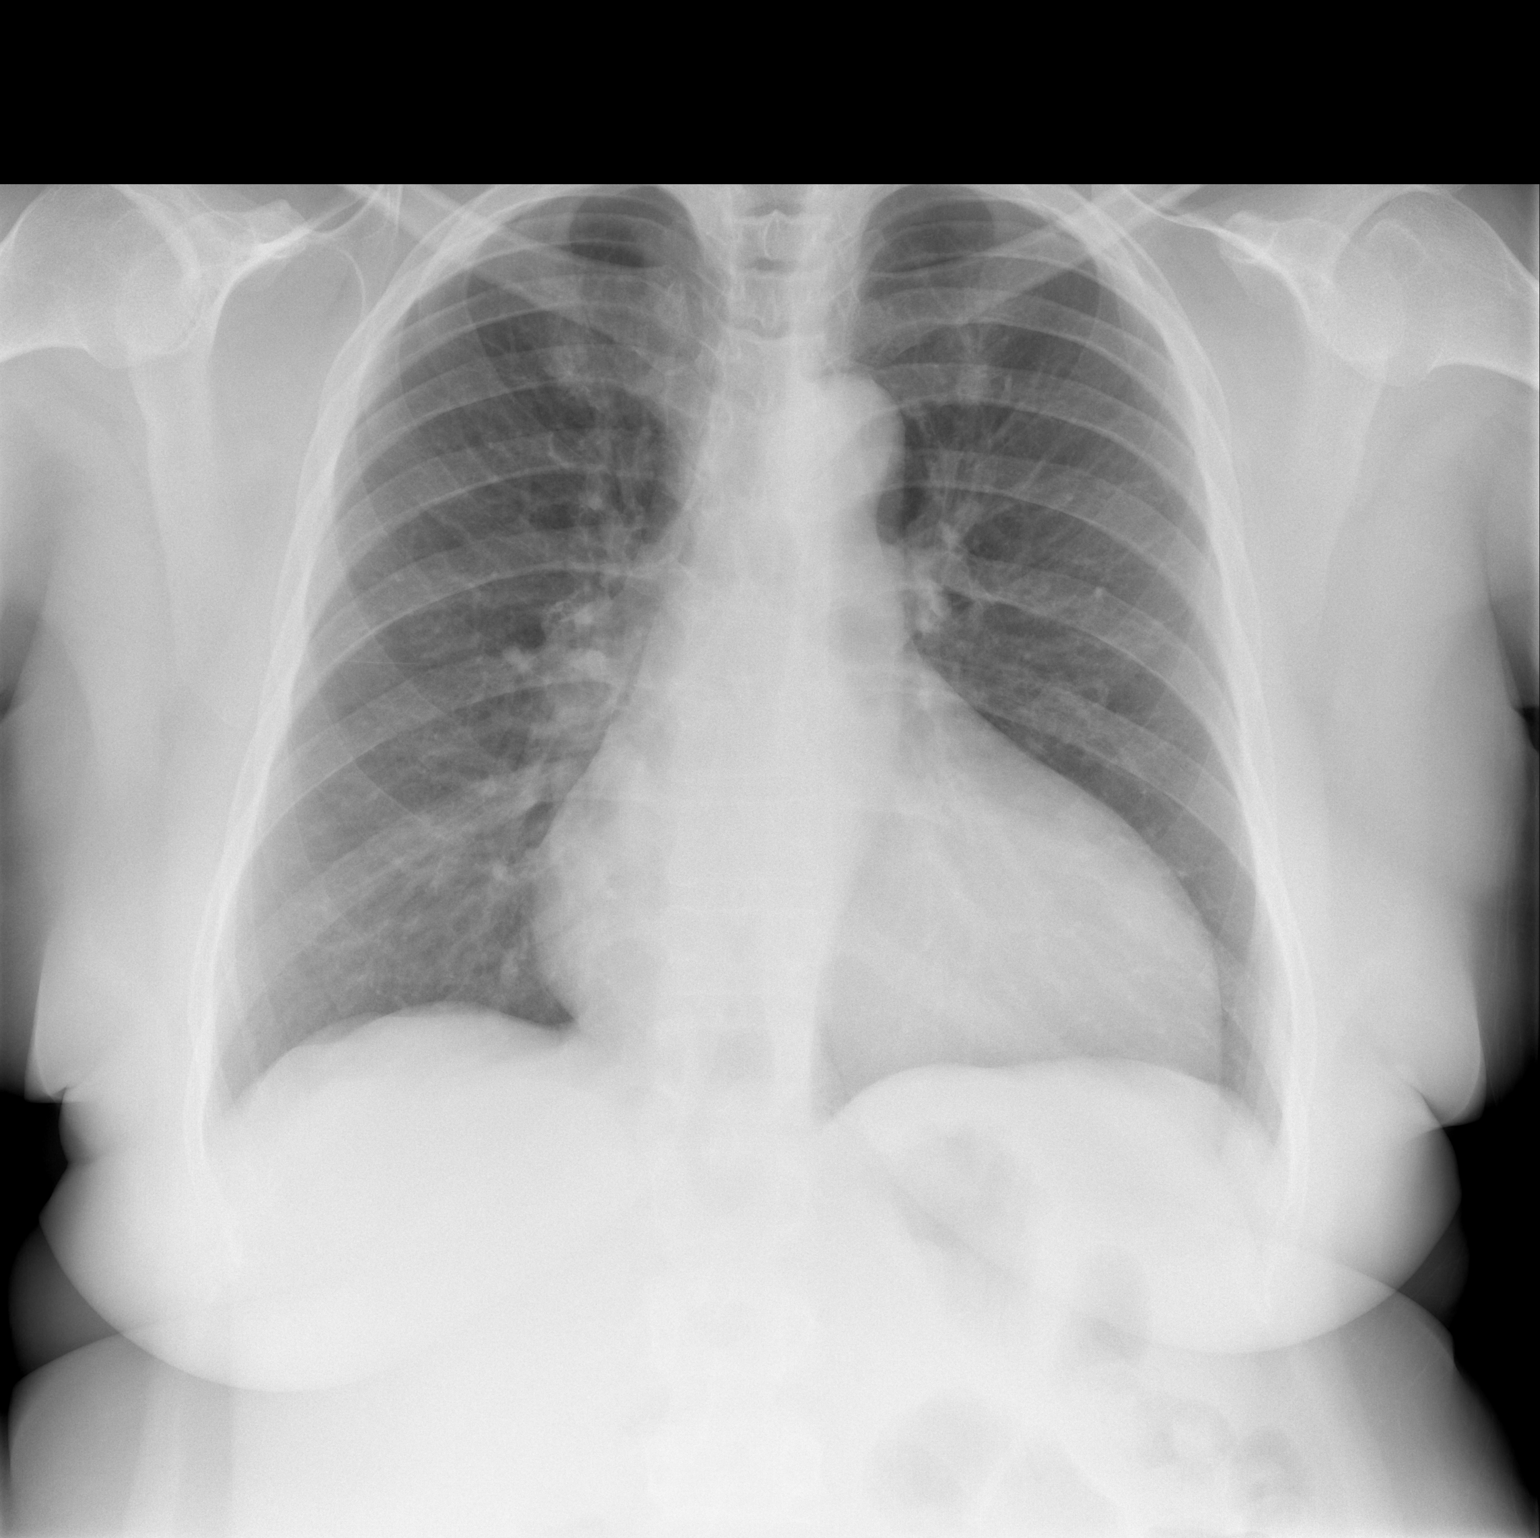

[w chest lat]
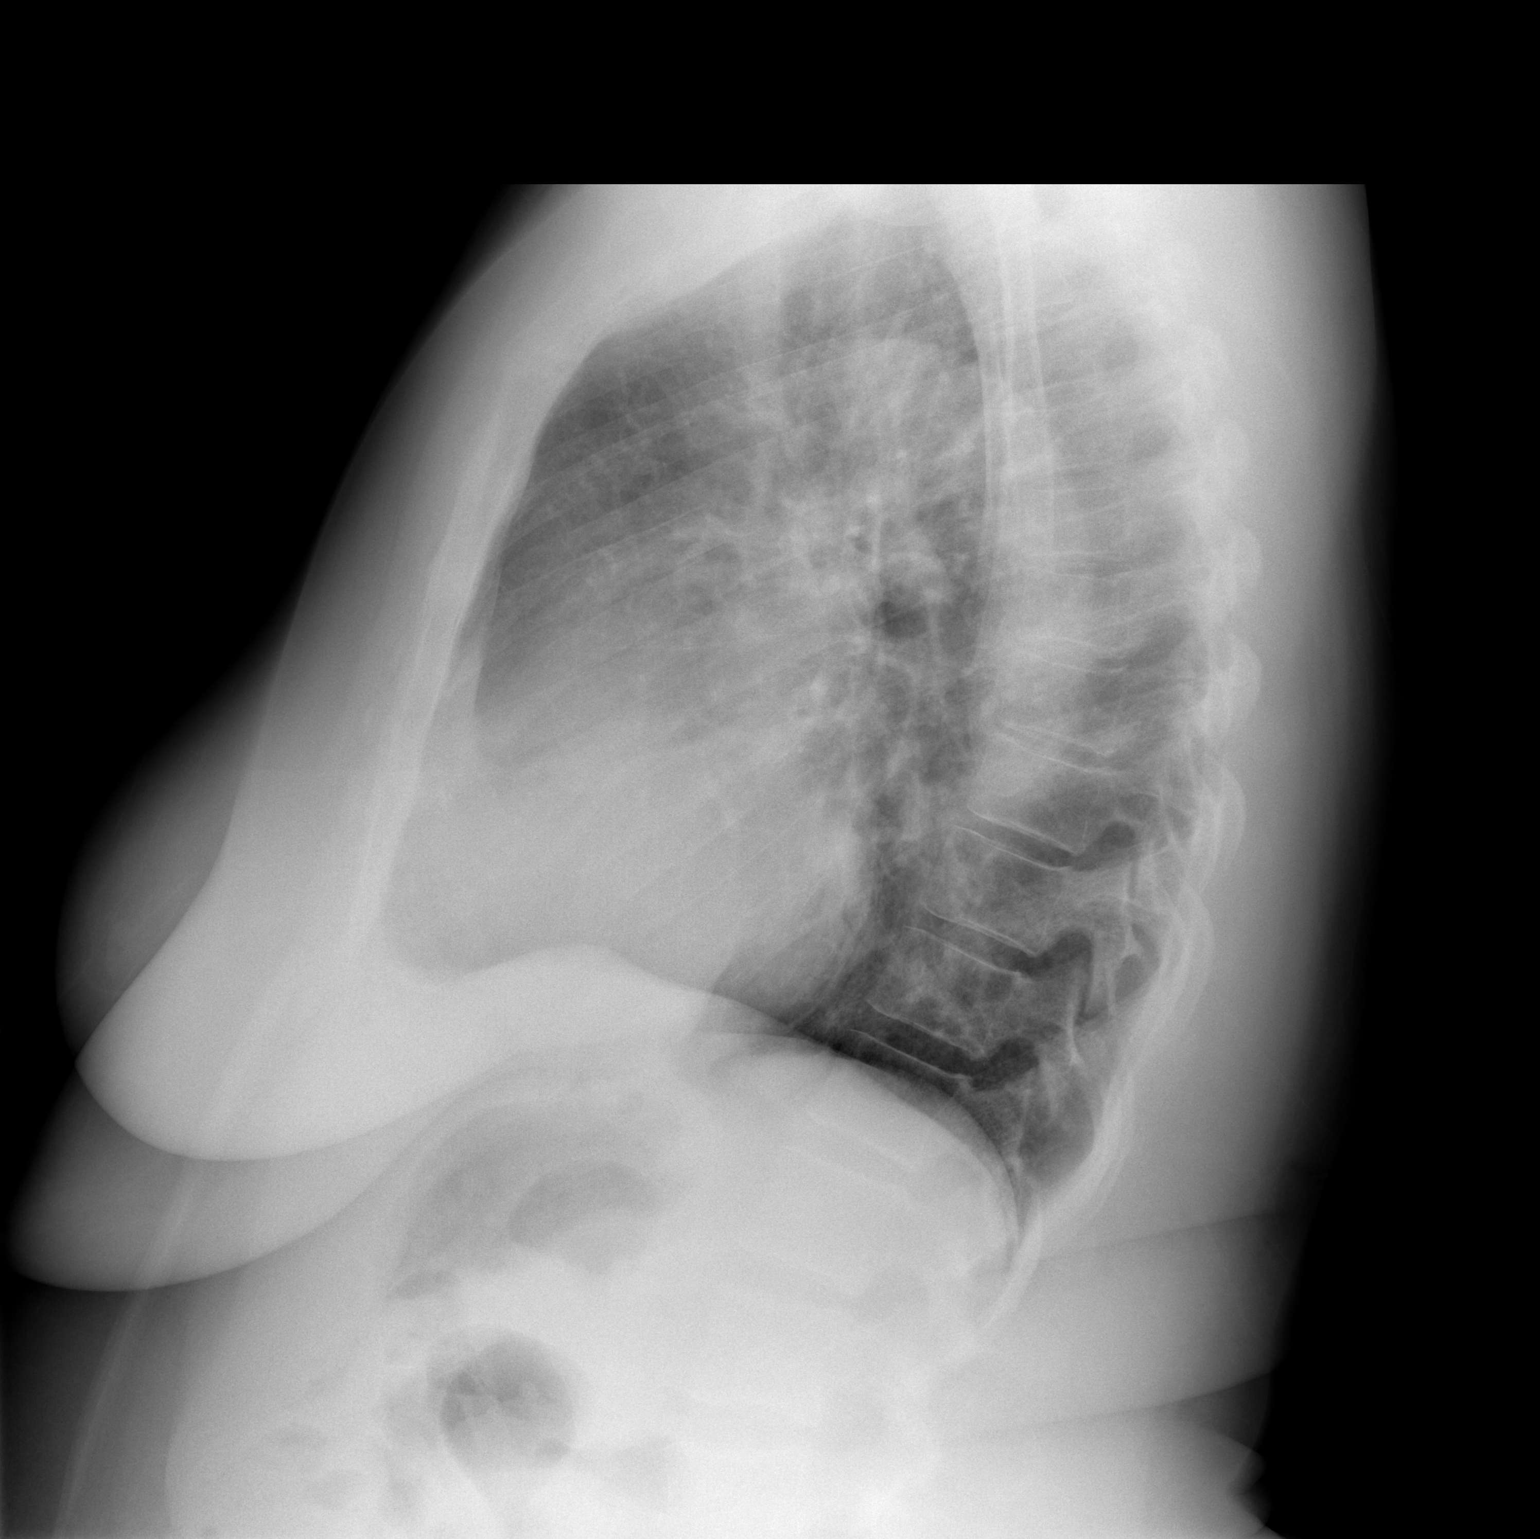

[2 of 2 positions shown; findings below may reference images not displayed]

FINDINGS: Lungs are clear. Heart is enlarged with pulmonary vascularity
normal. No adenopathy. No bone lesions.
IMPRESSION: There is cardiomegaly.  No edema or consolidation.  No adenopathy.

## 2019-08-07 ENCOUNTER — Other Ambulatory Visit: Payer: Self-pay | Admitting: Cardiology

## 2019-09-01 ENCOUNTER — Other Ambulatory Visit: Payer: Self-pay | Admitting: Cardiology

## 2019-09-08 ENCOUNTER — Encounter: Payer: Self-pay | Admitting: Family

## 2019-09-08 ENCOUNTER — Other Ambulatory Visit: Payer: Self-pay

## 2019-09-08 ENCOUNTER — Ambulatory Visit (INDEPENDENT_AMBULATORY_CARE_PROVIDER_SITE_OTHER): Payer: Self-pay | Admitting: Family

## 2019-09-08 VITALS — BP 130/68 | HR 82 | Ht 67.0 in | Wt 278.2 lb

## 2019-09-08 DIAGNOSIS — I5032 Chronic diastolic (congestive) heart failure: Secondary | ICD-10-CM

## 2019-09-08 DIAGNOSIS — I110 Hypertensive heart disease with heart failure: Secondary | ICD-10-CM

## 2019-09-08 DIAGNOSIS — R600 Localized edema: Secondary | ICD-10-CM

## 2019-09-08 MED ORDER — LOSARTAN POTASSIUM-HCTZ 100-12.5 MG PO TABS: 1.0000 | ORAL_TABLET | Freq: Every morning | ORAL | 0 refills | Status: DC

## 2019-09-08 MED ORDER — METOPROLOL SUCCINATE ER 25 MG PO TB24: 25.0000 mg | ORAL_TABLET | Freq: Every day | ORAL | 0 refills | Status: DC

## 2019-09-08 MED ORDER — HYDRALAZINE HCL 25 MG PO TABS: 25.0000 mg | ORAL_TABLET | Freq: Four times a day (QID) | ORAL | 0 refills | Status: DC

## 2019-09-08 MED ORDER — AMLODIPINE BESYLATE 5 MG PO TABS: 5.0000 mg | ORAL_TABLET | Freq: Every day | ORAL | 0 refills | Status: DC

## 2019-09-08 MED ORDER — FUROSEMIDE 40 MG PO TABS: ORAL_TABLET | ORAL | 0 refills | Status: DC

## 2019-09-08 MED ORDER — POTASSIUM CHLORIDE ER 10 MEQ PO TBCR: 10.0000 meq | EXTENDED_RELEASE_TABLET | Freq: Every day | ORAL | 0 refills | Status: DC

## 2019-09-08 NOTE — Progress Notes (Signed)
Office Visit    Patient Name: Kayla Carey Date of Encounter: 09/08/2019  Primary Care Provider:  Forrest Moron, MD Primary Cardiologist:  No primary care provider on file. Electrophysiologist:  None   Chief Complaint    Kayla Carey is a 48 y.o. female with a hx of hypertensive heart disease and diastolic heart failure presents today for overdue follow up of hypertensive heart disease with heart failure.    Past Medical History    Past Medical History:  Diagnosis Date  . Cervical cancer [facility])    gyn oncologist-  dr Gerarda Fraction--  bx 08-25-2018  ,  invasive SCC, Stage IA1  . Chronic diastolic (congestive) heart failure [facility])    cardiologist-  dr Bettina Gavia  . GERD (gastroesophageal reflux disease)    per pt just drinks water  . Hypertension   . IDA (iron deficiency anemia)    Past Surgical History:  Procedure Laterality Date  . BREAST SURGERY Left age 19   Lumpectomy-benign  . CERVICAL CONIZATION W/BX N/A 08/25/2018   Procedure: CONIZATION CERVIX WITH BIOPSY AND ENDOCERVICAL CURETTAGE;  Surgeon: Isabel Caprice, MD;  Location: [facility];  Service: Gynecology;  Laterality: N/A;  . LEFT HEART CATH AND CORONARY ANGIOGRAPHY N/A 08/13/2018   Procedure: LEFT HEART CATH AND CORONARY ANGIOGRAPHY;  Surgeon: Troy Sine, MD;  Location: [place] [facility];  Service: Cardiovascular;  Laterality: N/A;    Allergies  No Known Allergies  History of Present Illness    Kayla Carey is a 48 y.o. female with a hx of hypertensive heart disease with chronic diastolic heart failure, LVH last seen 09/25/2018 by Dr. Bettina Gavia.  She was initially referred for preoperative clearance.  Echo 07/2018 with LVEF 75 to 80%, severe LVH, grade 1 diastolic dysfunction, LA moderately dilated, RA mildly dilated.  Lexiscan 07/2018 was intermediate risk.  She underwent cardiac catheterization 08/13/2018 with normal coronary arteries, significant elevation in LVEDP at 40 mm.  She was  recommended for medical therapy with optimal blood pressure control.   In the interim she has had her GYN surgery, reports it took her about 4 to 5 months to recover but she is feeling much better.  She works as a Quarry manager at a [employer].  Tells me she has been working up to 16-hour shifts.  For example this week and she worksthree 16-hour shifts in a row.  Endorses she has been somewhat slack on her diet as she has just been grabbing what she can in between shifts.  She has no exercise regimen.  Tells me she thinks she has lost weight however compared to last office visit with Dr. Bettina Gavia she has gained she is stable on scales from 11/2018.  She reports compliance with her blood pressure medications.  Was having difficulty splitting hydralazine in half so instead of 1.5 tablets 3 times a day she is taking 1 tablet 4 times per day.  She checks BP intermittently at home.  Tells me she does not believe her blood pressure has been high as previously when it has been high she has had pain in her head.  She denies headaches.  Denies chest pain, pressure, shortness of breath, dyspnea on exertion.  She gets intermittent edema in her lower extremities after working long shifts at work that is relieved by compression stockings.  This is normal for her and unchanged from her baseline.  Difficult visit as she is hesitant regarding any medication changes, any testing.  She adamantly  declines EKG as she tells me she does not want to have to remove layers of clothing.  Emphasized importance of annual EKG but she continued to refuse.  She additionally refused blood work today telling me she "does not need it ".  EKGs/Labs/Other Studies Reviewed:   The following studies were reviewed today:  Echo 08/05/18 Study Conclusions - Left ventricle: The cavity size was normal. Wall thickness was   increased in a pattern of severe LVH. Systolic function was   vigorous. The estimated ejection fraction was in the range of  75%   to 80%. Wall motion was normal; there were no regional wall   motion abnormalities. Doppler parameters are consistent with   abnormal left ventricular relaxation (grade 1 diastolic   dysfunction). - Left atrium: The atrium was moderately dilated. - Right atrium: The atrium was mildly dilated.   Stress test 08/06/18 Baseline EKG with LVH and repolarization abnormality with no change during infusion. The left ventricular ejection fraction is normal (55-65%). The perfusion study is normal. There is transient ischemic dilatation with a TID of 1.3 which can be seen with multivessel CAD. Consider coronary CTA for further evaluation. This is an intermediate risk study due to transient ischemic dilatation.  LHC 0000000  LV end diastolic pressure is severely elevated.   Normal epicardial coronary arteries with a dominant RCA coronary circulation.   Significant elevation of LVEDP at 40 mm in this patient with hyperdynamic LV function and severe LVH with diastolic dysfunction.   RECOMMENDATION: Medical therapy with optimal blood pressure control. No indication for antiplatelet therapy at this time.  EKG: No EKG today Recent Labs: 10/03/2018: ALT 23 10/15/2018: BUN 11; Creatinine, Ser 0.63; Hemoglobin 13.1; Platelets 268; Potassium 4.0; Sodium 138  Recent Lipid Panel    Component Value Date/Time   CHOL 184 05/27/2018 1335   TRIG 54 05/27/2018 1335   HDL 48 05/27/2018 1335   CHOLHDL 3.8 05/27/2018 1335   LDLCALC 125 (H) 05/27/2018 1335    Home Medications   Current Meds  Medication Sig  . acetaminophen (TYLENOL) 500 MG tablet Take 1 tablet (500 mg total) by mouth every 6 (six) hours as needed.  Marland Kitchen albuterol (PROVENTIL HFA;VENTOLIN HFA) 108 (90 Base) MCG/ACT inhaler Inhale 1 puff into the lungs every 6 (six) hours as needed for wheezing or shortness of breath.  Marland Kitchen amLODipine (NORVASC) 5 MG tablet Take 1 tablet (5 mg total) by mouth daily.  . Ascorbic Acid (VITAMIN C) 1000 MG  tablet Take 1,000 mg by mouth daily.   . Cholecalciferol (VITAMIN D3) 2000 units capsule Take 2,000 Units by mouth daily.  . ferrous sulfate 325 (65 FE) MG tablet Take 325 mg by mouth daily.   . furosemide (LASIX) 40 MG tablet Take 1 tablet by mouth twice daily and 1 extra tablet on Monday, Wednesday, Friday  . hydrALAZINE (APRESOLINE) 25 MG tablet Take 1 tablet (25 mg total) by mouth 4 (four) times daily.  . hydrochlorothiazide (HYDRODIURIL) 12.5 MG tablet TAKE 1 TABLET BY MOUTH ONCE EVERY DAY  . losartan (COZAAR) 100 MG tablet TAKE 1 TABLET BY MOUTH ONCE EVERY DAY  . metoprolol succinate (TOPROL-XL) 25 MG 24 hr tablet Take 1 tablet (25 mg total) by mouth daily.  . potassium chloride (KLOR-CON) 10 MEQ tablet Take 1 tablet (10 mEq total) by mouth daily.  . [DISCONTINUED] amLODipine (NORVASC) 5 MG tablet Take 1 tablet (5 mg total) by mouth daily. NEED OFFICE VISIT FOR FURTHER RFILLS  . [DISCONTINUED] furosemide (LASIX) 40  MG tablet TAKE 1 TABLET BY MOUTH TWICE DAILY AND 1 EXTRA TABLET ON MONDAY, WEDNESDAY AND FRIDAY  . [DISCONTINUED] hydrALAZINE (APRESOLINE) 25 MG tablet TAKE 1 AND 1/2 TABLET THREE TIMES DAILY  . [DISCONTINUED] metoprolol succinate (TOPROL-XL) 25 MG 24 hr tablet Take 1 tablet (25 mg total) by mouth daily. NEED OFFICE VISIT FOR MORE REFILLS  . [DISCONTINUED] potassium chloride (K-DUR) 10 MEQ tablet Take 1 tablet (10 mEq total) by mouth daily. NEED OFFICE VISIT FOR MORE REFILLS     Review of Systems   Review of Systems  Constitution: Negative for chills, fever and malaise/fatigue.  Cardiovascular: Negative for chest pain, dyspnea on exertion, irregular heartbeat, leg swelling, near-syncope, orthopnea and palpitations.  Respiratory: Negative for cough, shortness of breath and wheezing.   Gastrointestinal: Negative for nausea and vomiting.  Neurological: Negative for dizziness, light-headedness and weakness.   All other systems reviewed and are otherwise negative except as noted  above.  Physical Exam    VS:  BP 130/68 (BP Location: Left Arm, Patient Position: Sitting, Cuff Size: Large)   Pulse 82   Ht 5\' 7"  (1.702 m)   Wt 278 lb 4 oz (126.2 kg)   SpO2 96%   BMI 43.58 kg/m  , BMI Body mass index is 43.58 kg/m. GEN: Well nourished, overweight, well developed, in no acute distress. HEENT: normal. Neck: Supple, no JVD, carotid bruits, or masses. Cardiac: RRR, no murmurs, rubs, or gallops. No clubbing, cyanosis, edema.  Radials/DP/PT 2+ and equal bilaterally.  Respiratory:  Respirations regular and unlabored, clear to auscultation bilaterally. GI: Soft, nontender, nondistended, BS + x 4. MS: No deformity or atrophy. Skin: Warm and dry, no rash. Neuro:  Strength and sensation are intact. Psych: Normal affect.  Assessment & Plan    1. Hypertensive heart disease with chronic diastolic congestive heart failure -Echo 08/05/2018 LVEF 75 to 80%, severe LVH, grade 1 diastolic dysfunction, LA moderately dilated, RA mildly dilated.  BP well controlled today on exam.  She is euvolemic on exam.  Continue present antihypertensive regimen including Hydralazine, beta blocker, ARB, HCTZ,  Amlodipine. We will update Hydralazine to 25 mg (1 tablet 4 times per day) as she previously had difficulty cutting tablets in half to make a 37.5 mg dose.  Emphasized importance of low-sodium, heart healthy diet. Regular exercise encouraged. The American Heart Association recommends 150 minutes of moderate intensity exercise weekly.  2. LVH - Severe LVH noted on echo 07/2018.  Continue to optimize BP as above.  3. LE edema -longstanding history of lower extremity but after long shifts of standing at work.  Likely etiology venous insufficiency.  She reports improvement with compression stockings and was encouraged to wear.  Continue present Lasix dose.  Disposition: Follow up in 3 month(s) with Dr. Bettina Gavia.  She will require EKG and labs (BMET) at that visit as she adamantly declined today.  This office visit will be required for further refills.   Loel Dubonnet, NP 09/08/2019, 4:28 PM

## 2019-09-08 NOTE — Patient Instructions (Addendum)
Medication Instructions:  No medication changes today.  *If you need a refill on your cardiac medications before your next appointment, please call your pharmacy*  Lab Work: None today. Would recommend BMET.  If you have labs (blood work) drawn today and your tests are completely normal, you will receive your results only by: Marland Kitchen MyChart Message (if you have MyChart) OR . A paper copy in the mail If you have any lab test that is abnormal or we need to change your treatment, we will call you to review the results.  Testing/Procedures: No testing today.  Follow-Up: At [facility], you and your health needs are our priority.  As part of our continuing mission to provide you with exceptional heart care, we have created designated Provider Care Teams.  These Care Teams include your primary Cardiologist (physician) and Advanced Practice Providers (APPs -  Physician Assistants and Nurse Practitioners) who all work together to provide you with the care you need, when you need it.  Your next appointment:   3 month(s)  The format for your next appointment:   In Person  Provider:   Shirlee More, MD  Other Instructions   DASH Eating Plan DASH stands for "Dietary Approaches to Stop Hypertension." The DASH eating plan is a healthy eating plan that has been shown to reduce high blood pressure (hypertension). It may also reduce your risk for type 2 diabetes, heart disease, and stroke. The DASH eating plan may also help with weight loss. What are tips for following this plan?  General guidelines  Avoid eating more than 2,300 mg (milligrams) of salt (sodium) a day. If you have hypertension, you may need to reduce your sodium intake to 1,500 mg a day.  Limit alcohol intake to no more than 1 drink a day for nonpregnant women and 2 drinks a day for men. One drink equals 12 oz of beer, 5 oz of wine, or 1 oz of hard liquor.  Work with your health care provider to maintain a healthy body weight or  to lose weight. Ask what an ideal weight is for you.  Get at least 30 minutes of exercise that causes your heart to beat faster (aerobic exercise) most days of the week. Activities may include walking, swimming, or biking.  Work with your health care provider or diet and nutrition specialist (dietitian) to adjust your eating plan to your individual calorie needs. Reading food labels   Check food labels for the amount of sodium per serving. Choose foods with less than 5 percent of the Daily Value of sodium. Generally, foods with less than 300 mg of sodium per serving fit into this eating plan.  To find whole grains, look for the word "whole" as the first word in the ingredient list. Shopping  Buy products labeled as "low-sodium" or "no salt added."  Buy fresh foods. Avoid canned foods and premade or frozen meals. Cooking  Avoid adding salt when cooking. Use salt-free seasonings or herbs instead of table salt or sea salt. Check with your health care provider or pharmacist before using salt substitutes.  Do not fry foods. Cook foods using healthy methods such as baking, boiling, grilling, and broiling instead.  Cook with heart-healthy oils, such as olive, canola, soybean, or sunflower oil. Meal planning  Eat a balanced diet that includes: ? 5 or more servings of fruits and vegetables each day. At each meal, try to fill half of your plate with fruits and vegetables. ? Up to 6-8 servings of whole  grains each day. ? Less than 6 oz of lean meat, poultry, or fish each day. A 3-oz serving of meat is about the same size as a deck of cards. One egg equals 1 oz. ? 2 servings of low-fat dairy each day. ? A serving of nuts, seeds, or beans 5 times each week. ? Heart-healthy fats. Healthy fats called Omega-3 fatty acids are found in foods such as flaxseeds and coldwater fish, like sardines, salmon, and mackerel.  Limit how much you eat of the following: ? Canned or prepackaged foods. ? Food that  is high in trans fat, such as fried foods. ? Food that is high in saturated fat, such as fatty meat. ? Sweets, desserts, sugary drinks, and other foods with added sugar. ? Full-fat dairy products.  Do not salt foods before eating.  Try to eat at least 2 vegetarian meals each week.  Eat more home-cooked food and less restaurant, buffet, and fast food.  When eating at a restaurant, ask that your food be prepared with less salt or no salt, if possible. What foods are recommended? The items listed may not be a complete list. Talk with your dietitian about what dietary choices are best for you. Grains Whole-grain or whole-wheat bread. Whole-grain or whole-wheat pasta. Brown rice. [place] Morrow. Bulgur. Whole-grain and low-sodium cereals. Pita bread. Low-fat, low-sodium crackers. Whole-wheat flour tortillas. Vegetables Fresh or frozen vegetables (raw, steamed, roasted, or grilled). Low-sodium or reduced-sodium tomato and vegetable juice. Low-sodium or reduced-sodium tomato sauce and tomato paste. Low-sodium or reduced-sodium canned vegetables. Fruits All fresh, dried, or frozen fruit. Canned fruit in natural juice (without added sugar). Meat and other protein foods Skinless chicken or [place]. Ground chicken or [place]. Pork with fat trimmed off. Fish and seafood. Egg whites. Dried beans, peas, or lentils. Unsalted nuts, nut butters, and seeds. Unsalted canned beans. Lean cuts of beef with fat trimmed off. Low-sodium, lean deli meat. Dairy Low-fat (1%) or fat-free (skim) milk. Fat-free, low-fat, or reduced-fat cheeses. Nonfat, low-sodium ricotta or cottage cheese. Low-fat or nonfat yogurt. Low-fat, low-sodium cheese. Fats and oils Soft margarine without trans fats. Vegetable oil. Low-fat, reduced-fat, or light mayonnaise and salad dressings (reduced-sodium). Canola, safflower, olive, soybean, and sunflower oils. Avocado. Seasoning and other foods Herbs. Spices. Seasoning mixes without salt.  Unsalted popcorn and pretzels. Fat-free sweets. What foods are not recommended? The items listed may not be a complete list. Talk with your dietitian about what dietary choices are best for you. Grains Baked goods made with fat, such as croissants, muffins, or some breads. Dry pasta or rice meal packs. Vegetables Creamed or fried vegetables. Vegetables in a cheese sauce. Regular canned vegetables (not low-sodium or reduced-sodium). Regular canned tomato sauce and paste (not low-sodium or reduced-sodium). Regular tomato and vegetable juice (not low-sodium or reduced-sodium). Angie Fava. Olives. Fruits Canned fruit in a light or heavy syrup. Fried fruit. Fruit in cream or butter sauce. Meat and other protein foods Fatty cuts of meat. Ribs. Fried meat. Berniece Salines. Sausage. Bologna and other processed lunch meats. Salami. Fatback. Hotdogs. Bratwurst. Salted nuts and seeds. Canned beans with added salt. Canned or smoked fish. Whole eggs or egg yolks. Chicken or [place] with skin. Dairy Whole or 2% milk, cream, and half-and-half. Whole or full-fat cream cheese. Whole-fat or sweetened yogurt. Full-fat cheese. Nondairy creamers. Whipped toppings. Processed cheese and cheese spreads. Fats and oils Butter. Stick margarine. Lard. Shortening. Ghee. Bacon fat. Tropical oils, such as coconut, palm kernel, or palm oil. Seasoning and other foods Salted popcorn  and pretzels. Onion salt, garlic salt, seasoned salt, table salt, and sea salt. Worcestershire sauce. Tartar sauce. Barbecue sauce. Teriyaki sauce. Soy sauce, including reduced-sodium. Steak sauce. Canned and packaged gravies. Fish sauce. Oyster sauce. Cocktail sauce. Horseradish that you find on the shelf. Ketchup. Mustard. Meat flavorings and tenderizers. Bouillon cubes. Hot sauce and Tabasco sauce. Premade or packaged marinades. Premade or packaged taco seasonings. Relishes. Regular salad dressings. Where to find more information:  National Heart, Lung, and [place]: https://wilson-eaton.com/  American Heart Association: www.heart.org Summary  The DASH eating plan is a healthy eating plan that has been shown to reduce high blood pressure (hypertension). It may also reduce your risk for type 2 diabetes, heart disease, and stroke.  With the DASH eating plan, you should limit salt (sodium) intake to 2,300 mg a day. If you have hypertension, you may need to reduce your sodium intake to 1,500 mg a day.  When on the DASH eating plan, aim to eat more fresh fruits and vegetables, whole grains, lean proteins, low-fat dairy, and heart-healthy fats.  Work with your health care provider or diet and nutrition specialist (dietitian) to adjust your eating plan to your individual calorie needs. This information is not intended to replace advice given to you by your health care provider. Make sure you discuss any questions you have with your health care provider. Document Released: 09/20/2011 Document Revised: 09/13/2017 Document Reviewed: 09/24/2016 Elsevier Patient Education  2020 [employer].

## 2019-09-22 NOTE — Progress Notes (Signed)
Gynecologic Oncology Return Clinic Visit  09/22/19    Reason for Visit: Surveillance in the setting of early cervical cancer  Treatment History: Oncology History  Malignant neoplasm of cervix [facility])   Initial Diagnosis   Malignant neoplasm of cervix ([place])   06/25/2018 Miscellaneous   Pap - HSIL   07/15/2018 Initial Biopsy   COLPO with biopsies: 1. 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 -NO INVASIVE NEOPLASM IDENTIFIED. -SEE COMMENT. 2. Cervix, biopsy, 3 o'clock -CERVICAL TRANSFORMATION ZONE MUCOSA WITH CIN-III (SEVERE SQUAMOUS DYSPLASIA/SQUAMOUS CARCINOMA IN SITU; HIGH GRADE SQUAMOUS INTRAEPITHELIAL LESION). -NO INVASIVE NEOPLASM IDENTIFIED. -SEE COMMENT. 3. Cervix, biopsy, 6 o'clock -FRAGMENTED SQUAMOUS MUCOSA WITH CIN-III (SEVERE SQUAMOUS DYSPLASIA/SQUAMOUS CARCINOMA IN SITU; HIGH GRADE SQUAMOUS INTRAEPITHELIAL LESION). -CANNOT RULE OUT HIGHER GRADE LESION. 4. Cervix, biopsy, 9 o'clock -FOCI OF SUBMUCOSAL INVASIVE SQUAMOUS CELL CARCINOMA IN A SETTING OF FRAGMENTED CERVICAL TRANSFORMATION ZONE MUCOSA WITH CIN-III (SEVERE SQUAMOUS DYSPLASIA/SQUAMOUS CARCINOMA IN SITU; HIGH GRADE SQUAMOUS INTRAEPITHELIAL LESION). -SEE COMMENT. The portion of the endocervical stroma containing invasive squamous cell carcinoma is heavily inflamed, unoriented and devoid of surface mucosa. In this setting, the depth of invasion cannot be accurately determined. 5. Endocervix, curettage - FRAGMENTED CERVICAL TRANSFORMATION ZONE MUCOSA WITH CIN-III (SEVERE SQUAMOUS DYSPLASIA/SQUAMOUS CARCINOMA IN SITU; HIGH GRADE SQUAMOUS INTRAEPITHELIAL LESION). - CERVICAL STROMA NOT PRESENT FOR EVALUATION. - SEE COMMENT.   08/25/2018 Surgery   [facility] 1. Cervix, cone - INVASIVE SQUAMOUS CELL CARCINOMA, WELL-DIFFERENTIATED. - TUMOR SPANS 3 MM IN WIDTH, 2 MM IN LENGTH, AND 2 MM IN DEPTH. -  BACKGROUND HIGH GRADE SQUAMOUS INTRAEPITHELIAL LESION, CIN-III (SEVERE DYSPLASIA/CIS) WITH ENDOCERVICAL GLAND INVOLVEMENT. - MARGINS ARE NEGATIVE FOR INVASIVE CARCINOMA. - CIN-III EXTENDS TO ENDOCERVICAL MARGIN BROADLY. - SEE COMMENT. 2. Endocervix, curettage - DETACHED FRAGMENTS OF AT LEAST HIGH GRADE SQUAMOUS INTRAEPITHELIAL LESION, CIN-III (SEVERE DYSPLASIA/CIS). - SEE COMMENT. Microscopic Comment 1. The invasive carcinoma is located at roughly 10:00 position and is 2 mm from the ectocervical margin. CIN-III is present at the endocervical margin from roughly 12:00-3:00. An oncology table is not required for cervical cone excisions. 2. Fragments of at least CIN-III are detached and not associated with stroma, thus invasion can not be assessed. Addendum showed no LVSI   10/14/2018 Surgery   TRH/BS - no residual CA or dysplasia   10/14/2018 Pathologic Stage   Stage Ia1 SCC of the cervix, no LVSI     Interval History: Patient is overall doing very well.  She missed her last visit with [place] secondary to concerns over the COVID-19 pandemic and coming to the hospital.  She denies any vaginal bleeding or discharge.  She denies any pelvic pain.  She endorses very occasional twinges in her abdomen that are short-lived and resolved without intervention.  She endorses normal bowel function and no changes to her urination, although notes increased frequency secondary to being on Lasix.  She endorses a good appetite without nausea or vomiting.  She has not had a mammogram as this was delayed secondary to the pandemic.  She plans to call her primary care provider to be seen and to get new referral for a mammogram.  Past Medical/Surgical History: Past Medical History:  Diagnosis Date  . Cervical cancer [facility])    gyn oncologist-  dr Gerarda Fraction--  bx 08-25-2018  ,  invasive SCC, Stage IA1  . Chronic diastolic (congestive) heart failure [facility])    cardiologist-  dr Bettina Gavia  . GERD (gastroesophageal reflux  disease)    per pt just drinks water  . Hypertension   . IDA (iron deficiency anemia)     Past Surgical History:  Procedure Laterality Date  . BREAST SURGERY Left age 66   Lumpectomy-benign  . CERVICAL CONIZATION W/BX N/A 08/25/2018   Procedure: CONIZATION CERVIX WITH BIOPSY AND ENDOCERVICAL CURETTAGE;  Surgeon: Isabel Caprice, MD;  Location: [facility];  Service: Gynecology;  Laterality: N/A;  . LEFT HEART CATH AND CORONARY ANGIOGRAPHY N/A 08/13/2018   Procedure: LEFT HEART CATH AND CORONARY ANGIOGRAPHY;  Surgeon: Troy Sine, MD;  Location: [place] [facility];  Service: Cardiovascular;  Laterality: N/A;    Family History  Problem Relation Age of Onset  . Hypertension Mother   . Congestive Heart Failure Mother   . Hypertension Father   . Diabetes Father   . Cancer Father        Prostate  . Kidney cancer Son        son died from disease    Social History   Socioeconomic History  . Marital status: Single    Spouse name: Not on file  . Number of children: Not on file  . Years of education: Not on file  . Highest education level: Not on file  Occupational History  . Not on file  Social Needs  . Financial resource strain: Not on file  . Food insecurity    Worry: Not on file    Inability: Not on file  . Transportation needs    Medical: Not on file    Non-medical: Not on file  Tobacco Use  . Smoking status: Former Smoker    Packs/day: 0.50    Years: 20.00    Pack years: 10.00    Types: Cigarettes    Quit date: 07/15/2018    Years since quitting: 1.1  . Smokeless tobacco: Never Used  Substance and Sexual Activity  . Alcohol use: Not Currently  . Drug use: Never  . Sexual activity: Not Currently    Comment: 1st intercourse 48 yo-More than 5 partners  Lifestyle  . Physical activity    Days per week: Not on file    Minutes per session: Not on file  . Stress: Not on file  Relationships  . Social Herbalist on phone: Not on file     Gets together: Not on file    Attends religious service: Not on file    Active member of club or organization: Not on file    Attends meetings of clubs or organizations: Not on file    Relationship status: Not on file  Other Topics Concern  . Not on file  Social History Narrative  . Not on file    Current Medications:  Current Outpatient Medications:  .  acetaminophen (TYLENOL) 500 MG tablet, Take 1 tablet (500 mg total) by mouth every 6 (six) hours as needed., Disp: 30 tablet, Rfl: 2 .  albuterol (PROVENTIL HFA;VENTOLIN HFA) 108 (90 Base) MCG/ACT inhaler, Inhale 1 puff into the lungs every 6 (six) hours as needed for wheezing or shortness of breath., Disp: 8 Inhaler, Rfl: 3 .  amLODipine (NORVASC) 5 MG tablet, Take 1 tablet (5 mg total) by mouth daily., Disp: 90 tablet, Rfl: 0 .  Ascorbic Acid (VITAMIN C) 1000 MG tablet, Take 1,000 mg by mouth daily. , Disp: , Rfl:  .  aspirin EC 81 MG tablet, Take 81 mg by mouth daily., Disp: , Rfl:  .  Cholecalciferol (  VITAMIN D3) 2000 units capsule, Take 2,000 Units by mouth daily., Disp: , Rfl:  .  ferrous sulfate 325 (65 FE) MG tablet, Take 325 mg by mouth daily. , Disp: , Rfl:  .  furosemide (LASIX) 40 MG tablet, Take 1 tablet by mouth twice daily and 1 extra tablet on Monday, Wednesday, Friday, Disp: 144 tablet, Rfl: 0 .  hydrALAZINE (APRESOLINE) 25 MG tablet, Take 1 tablet (25 mg total) by mouth 4 (four) times daily., Disp: 360 tablet, Rfl: 0 .  losartan-hydrochlorothiazide (HYZAAR) 100-12.5 MG tablet, Take 1 tablet by mouth every morning., Disp: 90 tablet, Rfl: 0 .  metoprolol succinate (TOPROL-XL) 25 MG 24 hr tablet, Take 1 tablet (25 mg total) by mouth daily., Disp: 90 tablet, Rfl: 0 .  metoprolol tartrate (LOPRESSOR) 25 MG tablet, Take 25 mg by mouth every morning. , Disp: , Rfl:  .  potassium chloride (KLOR-CON) 10 MEQ tablet, Take 1 tablet (10 mEq total) by mouth daily., Disp: 90 tablet, Rfl: 0  Review of Symptoms: Denies appetite  changes, fevers, chills, fatigue, unexplained weight changes. Denies hearing loss, neck lumps or masses, mouth sores, ringing in ears or voice changes. Denies cough or wheezing.  Denies shortness of breath. Denies chest pain or palpitations. Denies leg swelling. Denies abdominal distention, pain, blood in stools, constipation, diarrhea, nausea, vomiting, or early satiety. Denies pain with intercourse, dysuria, frequency, hematuria or incontinence. Denies hot flashes, pelvic pain, vaginal bleeding or vaginal discharge.   Denies joint pain, back pain or muscle pain/cramps. Denies itching, rash, or wounds. Denies dizziness, headaches, numbness or seizures. Denies swollen lymph nodes or glands, denies easy bruising or bleeding. Denies anxiety, depression, confusion, or decreased concentration.  Physical Exam: BP (!) 150/90 (BP Location: Left Arm, Patient Position: Sitting)   Pulse 87   Temp 97.6 F (36.4 C) (Temporal)   Resp 20   Ht 5\' 7"  (1.702 m)   Wt 278 lb 12.8 oz (126.5 kg)   SpO2 100%   BMI 43.67 kg/m  General: Alert, oriented, no acute distress. HEENT: Atraumatic, sclera anicteric. Chest: Unlabored breathing on room air Abdomen: Obese, soft, nontender.  Normoactive bowel sounds.  No masses or hepatosplenomegaly appreciated.  Well-healed robotic incisions. Extremities: Grossly normal range of motion.  Warm, well perfused.  1+ edema of the feet bilaterally with trace edema of lower legs. Skin: No rashes or lesions noted. Lymphatics: No cervical, supraclavicular, or inguinal adenopathy. GU: Normal appearing external genitalia without erythema, excoriation, or lesions.  Speculum exam reveals normal-appearing vaginal cuff, no lesions or masses noted.  No blood or discharge within the vault.  Bimanual exam reveals intact cuff, no tenderness with palpation or fluctuance.  Rectovaginal exam confirms no nodularity.  Laboratory & Radiologic Studies: None new  Assessment & Plan: Kayla Carey is a 48 y.o. 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  Pap smear collected today. Per [facility] cervical cancer surveillance recommendations in the setting of low risk early stage disease, we will plan for 44-month visits with pelvic exam for 2 years after treatment, and then will transition to yearly visits.  We will continue performing yearly Pap test.  Precautions reviewed with the patient that should prompt a call to our clinic sooner than her next scheduled appointment.  I encouraged her to follow-up with her primary care provider to get her mammogram rescheduled.  Jeral Pinch, MD  Division of Gynecologic Oncology  Department of Obstetrics and Gynecology  [facility]

## 2019-09-23 ENCOUNTER — Encounter: Payer: Self-pay | Admitting: Gynecologic Oncology

## 2019-09-23 ENCOUNTER — Other Ambulatory Visit (HOSPITAL_COMMUNITY)
Admission: RE | Admit: 2019-09-23 | Discharge: 2019-09-23 | Disposition: A | Discharge status: Home / Self Care | Payer: 59 | Source: Ambulatory Visit | Attending: Gynecologic Oncology | Admitting: Gynecologic Oncology

## 2019-09-23 ENCOUNTER — Other Ambulatory Visit: Payer: Self-pay

## 2019-09-23 ENCOUNTER — Inpatient Hospital Stay: Payer: 59 | Attending: Gynecologic Oncology | Admitting: Gynecologic Oncology

## 2019-09-23 VITALS — BP 150/90 | HR 87 | Temp 97.6°F | Resp 20 | Ht 67.0 in | Wt 278.8 lb

## 2019-09-23 DIAGNOSIS — I5032 Chronic diastolic (congestive) heart failure: Secondary | ICD-10-CM | POA: Insufficient documentation

## 2019-09-23 DIAGNOSIS — I110 Hypertensive heart disease with heart failure: Secondary | ICD-10-CM | POA: Insufficient documentation

## 2019-09-23 DIAGNOSIS — F17210 Nicotine dependence, cigarettes, uncomplicated: Secondary | ICD-10-CM | POA: Insufficient documentation

## 2019-09-23 DIAGNOSIS — C538 Malignant neoplasm of overlapping sites of cervix uteri: Secondary | ICD-10-CM | POA: Insufficient documentation

## 2019-09-23 DIAGNOSIS — K219 Gastro-esophageal reflux disease without esophagitis: Secondary | ICD-10-CM | POA: Insufficient documentation

## 2019-09-23 DIAGNOSIS — Z79899 Other long term (current) drug therapy: Secondary | ICD-10-CM | POA: Insufficient documentation

## 2019-09-23 DIAGNOSIS — Z7982 Long term (current) use of aspirin: Secondary | ICD-10-CM | POA: Insufficient documentation

## 2019-09-23 NOTE — Patient Instructions (Signed)
It was a pleasure meeting you today!  Your exam was very reassuring.  I will call you with the results of your Pap test.  As long as these are normal, I will see you again in 6 months for an exam in clinic.  If you develop vaginal bleeding, discharge, or new symptoms, please call our clinic immediately at [phone].

## 2019-09-28 ENCOUNTER — Telehealth: Payer: Self-pay

## 2019-09-28 LAB — CYTOLOGY - PAP
Comment: NEGATIVE
Diagnosis: NEGATIVE
High risk HPV: NEGATIVE

## 2019-09-28 NOTE — Telephone Encounter (Signed)
Told Kayla Carey that the pap smear was normal and HPV negative. Pt verbalized understanding.

## 2019-09-29 ENCOUNTER — Telehealth: Payer: Self-pay | Admitting: Gynecologic Oncology

## 2019-09-29 NOTE — Telephone Encounter (Signed)
Called patient with Pap test results.  Voiced appreciation for the phone call.  Jeral Pinch MD Gynecologic Oncology

## 2019-11-26 ENCOUNTER — Ambulatory Visit: Payer: Self-pay | Admitting: Cardiology

## 2019-12-07 ENCOUNTER — Other Ambulatory Visit: Payer: Self-pay | Admitting: Family

## 2019-12-21 NOTE — Progress Notes (Signed)
Cardiology Office Note:    Date:  12/23/2019   ID:  Kayla Carey, DOB 21-Jul-1971, MRN [id number]  PCP:  Forrest Moron, MD  Cardiologist:  Shirlee More, MD    Referring MD: Forrest Moron, MD    ASSESSMENT:    1. Hypertensive heart disease with chronic diastolic congestive heart failure ([place])   2. Iron deficiency anemia, unspecified iron deficiency anemia type    PLAN:    In order of problems listed above:  1. Improved blood pressure at target continue current treatment with loop diuretic antihypertensives ACE inhibitor hydralazine and amlodipine. 2. Recheck CBC last hemoglobin was normal I think she can stop iron   Next appointment: 6 Months   Medication Adjustments/Labs and Tests Ordered: Current medicines are reviewed at length with the patient today.  Concerns regarding medicines are outlined above.  Orders Placed This Encounter  Procedures  . Basic metabolic panel  . CBC   Meds ordered this encounter  Medications  . amLODipine (NORVASC) 5 MG tablet    Sig: TAKE 1 TABLET BY MOUTH ONCE EVERY DAY    Dispense:  30 tablet    Refill:  5    * * N O T I C E * * Last quantity doesn't match original quantity  . furosemide (LASIX) 40 MG tablet    Sig: TAKE 1 TABLET BY MOUTH TWICE DAILY AND AN EXTRA TABLET ON MONDAY, WEDNESDAY AND FRIDAY    Dispense:  80 tablet    Refill:  5    12/07/2019 3:39:56 PM* * N O T I C E * * Last quantity doesn't match original quantity  . hydrALAZINE (APRESOLINE) 25 MG tablet    Sig: Take 1 tablet (25 mg total) by mouth 4 (four) times daily.    Dispense:  120 tablet    Refill:  5    * * N O T I C E * * Last quantity doesn't match original quantity  . losartan-hydrochlorothiazide (HYZAAR) 100-12.5 MG tablet    Sig: Take 1 tablet by mouth every morning.    Dispense:  30 tablet    Refill:  5    * * N O T I C E * * Last quantity doesn't match original quantity  . metoprolol succinate (TOPROL-XL) 25 MG 24 hr tablet    Sig: TAKE 1 TABLET  BY MOUTH ONCE EVERY DAY    Dispense:  30 tablet    Refill:  5    * * N O T I C E * * Last quantity doesn't match original quantity  . potassium chloride (KLOR-CON) 10 MEQ tablet    Sig: TAKE 1 TABLET BY MOUTH ONCE EVERY DAY    Dispense:  30 tablet    Refill:  5    * * N O T I C E * * Last quantity doesn't match original quantity    Chief Complaint  Patient presents with  . Follow-up  . Congestive Heart Failure  . Hypertension    History of Present Illness:    Kayla Carey is a 49 y.o. female with a hx of  hypertensive heart disease and diastolic heart failure  last seen 09/08/2019.  Overall she is done well recovered from her gynecologic surgery and her hemoglobin is normalized.  I told her she could stop her aspirin.  She has no edema shortness of breath chest pain palpitation or syncope and her blood pressure is nicely controlled on a multidrug regimen.  She does take a diuretic  and will check renal function potassium today Compliance with diet, lifestyle and medications: Yes Past Medical History:  Diagnosis Date  . Cervical cancer [facility])    gyn oncologist-  dr Gerarda Fraction--  bx 08-25-2018  ,  invasive SCC, Stage IA1  . Chronic diastolic (congestive) heart failure [facility])    cardiologist-  dr Bettina Gavia  . GERD (gastroesophageal reflux disease)    per pt just drinks water  . Hypertension   . IDA (iron deficiency anemia)     Past Surgical History:  Procedure Laterality Date  . BREAST SURGERY Left age 63   Lumpectomy-benign  . CERVICAL CONIZATION W/BX N/A 08/25/2018   Procedure: CONIZATION CERVIX WITH BIOPSY AND ENDOCERVICAL CURETTAGE;  Surgeon: Isabel Caprice, MD;  Location: [facility];  Service: Gynecology;  Laterality: N/A;  . LEFT HEART CATH AND CORONARY ANGIOGRAPHY N/A 08/13/2018   Procedure: LEFT HEART CATH AND CORONARY ANGIOGRAPHY;  Surgeon: Troy Sine, MD;  Location: [facility];  Service: Cardiovascular;  Laterality: N/A;    Current  Medications: Current Meds  Medication Sig  . albuterol (PROVENTIL HFA;VENTOLIN HFA) 108 (90 Base) MCG/ACT inhaler Inhale 1 puff into the lungs every 6 (six) hours as needed for wheezing or shortness of breath.  Marland Kitchen amLODipine (NORVASC) 5 MG tablet TAKE 1 TABLET BY MOUTH ONCE EVERY DAY  . Ascorbic Acid (VITAMIN C) 1000 MG tablet Take 1,000 mg by mouth daily.   Marland Kitchen aspirin EC 81 MG tablet Take 81 mg by mouth daily.  . Cholecalciferol (VITAMIN D3) 2000 units capsule Take 2,000 Units by mouth daily.  . ferrous sulfate 325 (65 FE) MG tablet Take 325 mg by mouth daily.   . furosemide (LASIX) 40 MG tablet TAKE 1 TABLET BY MOUTH TWICE DAILY AND AN EXTRA TABLET ON MONDAY, WEDNESDAY AND FRIDAY  . hydrALAZINE (APRESOLINE) 25 MG tablet Take 1 tablet (25 mg total) by mouth 4 (four) times daily.  Marland Kitchen losartan-hydrochlorothiazide (HYZAAR) 100-12.5 MG tablet Take 1 tablet by mouth every morning.  . metoprolol succinate (TOPROL-XL) 25 MG 24 hr tablet TAKE 1 TABLET BY MOUTH ONCE EVERY DAY  . potassium chloride (KLOR-CON) 10 MEQ tablet TAKE 1 TABLET BY MOUTH ONCE EVERY DAY  . [DISCONTINUED] amLODipine (NORVASC) 5 MG tablet TAKE 1 TABLET BY MOUTH ONCE EVERY DAY  . [DISCONTINUED] furosemide (LASIX) 40 MG tablet TAKE 1 TABLET BY MOUTH TWICE DAILY AND AN EXTRA TABLET ON MONDAY, WEDNESDAY AND FRIDAY  . [DISCONTINUED] hydrALAZINE (APRESOLINE) 25 MG tablet TAKE 1 TABLET BY MOUTH FOUR TIMES DAILY  . [DISCONTINUED] losartan-hydrochlorothiazide (HYZAAR) 100-12.5 MG tablet TAKE 1 TABLET BY MOUTH EVERY MORNING  . [DISCONTINUED] metoprolol succinate (TOPROL-XL) 25 MG 24 hr tablet TAKE 1 TABLET BY MOUTH ONCE EVERY DAY  . [DISCONTINUED] potassium chloride (KLOR-CON) 10 MEQ tablet TAKE 1 TABLET BY MOUTH ONCE EVERY DAY     Allergies:   Patient has no known allergies.   Social History   Socioeconomic History  . Marital status: Single    Spouse name: Not on file  . Number of children: Not on file  . Years of education: Not on  file  . Highest education level: Not on file  Occupational History  . Not on file  Tobacco Use  . Smoking status: Former Smoker    Packs/day: 0.50    Years: 20.00    Pack years: 10.00    Types: Cigarettes    Quit date: 07/15/2018    Years since quitting: 1.4  . Smokeless tobacco: Never  Used  Substance and Sexual Activity  . Alcohol use: Not Currently  . Drug use: Never  . Sexual activity: Not Currently    Comment: 1st intercourse 49 yo-More than 5 partners  Other Topics Concern  . Not on file  Social History Narrative  . Not on file   Social Determinants of Health   Financial Resource Strain:   . Difficulty of Paying Living Expenses: Not on file  Food Insecurity:   . Worried About Charity fundraiser in the Last Year: Not on file  . Ran Out of Food in the Last Year: Not on file  Transportation Needs:   . Lack of Transportation (Medical): Not on file  . Lack of Transportation (Non-Medical): Not on file  Physical Activity:   . Days of Exercise per Week: Not on file  . Minutes of Exercise per Session: Not on file  Stress:   . Feeling of Stress : Not on file  Social Connections:   . Frequency of Communication with Friends and Family: Not on file  . Frequency of Social Gatherings with Friends and Family: Not on file  . Attends Religious Services: Not on file  . Active Member of Clubs or Organizations: Not on file  . Attends Archivist Meetings: Not on file  . Marital Status: Not on file     Family History: The patient's family history includes Cancer in her father; Congestive Heart Failure in her mother; Diabetes in her father; Hypertension in her father and mother; Kidney cancer in her son. ROS:   Please see the history of present illness.    All other systems reviewed and are negative.  EKGs/Labs/Other Studies Reviewed:    The following studies were reviewed today:  EKG:  EKG ordered today and personally reviewed.  The ekg ordered today demonstrates  sinus rhythm minor nonspecific ST change otherwise normal EKG  Recent Labs: 12/22/2019: BUN 14; Creatinine, Ser 0.80; Hemoglobin 14.0; Platelets 218; Potassium 3.7; Sodium 143  Recent Lipid Panel    Component Value Date/Time   CHOL 184 05/27/2018 1335   TRIG 54 05/27/2018 1335   HDL 48 05/27/2018 1335   CHOLHDL 3.8 05/27/2018 1335   LDLCALC 125 (H) 05/27/2018 1335    Physical Exam:    VS:  BP 116/68   Pulse 77   Temp (!) 96.3 F (35.7 C)   Ht 5\' 7"  (1.702 m)   Wt 271 lb (122.9 kg)   LMP  (LMP Unknown)   SpO2 97%   BMI 42.44 kg/m     Wt Readings from Last 3 Encounters:  12/22/19 271 lb (122.9 kg)  09/23/19 278 lb 12.8 oz (126.5 kg)  09/08/19 278 lb 4 oz (126.2 kg)     GEN:  Well nourished, well developed in no acute distress HEENT: Normal NECK: No JVD; No carotid bruits LYMPHATICS: No lymphadenopathy CARDIAC: RRR, no murmurs, rubs, gallops RESPIRATORY:  Clear to auscultation without rales, wheezing or rhonchi  ABDOMEN: Soft, non-tender, non-distended MUSCULOSKELETAL:  No edema; No deformity  SKIN: Warm and dry NEUROLOGIC:  Alert and oriented x 3 PSYCHIATRIC:  Normal affect    Signed, Shirlee More, MD  12/23/2019 7:41 AM    [place]

## 2019-12-22 ENCOUNTER — Encounter: Payer: Self-pay | Admitting: Cardiology

## 2019-12-22 ENCOUNTER — Other Ambulatory Visit: Payer: Self-pay

## 2019-12-22 ENCOUNTER — Ambulatory Visit (INDEPENDENT_AMBULATORY_CARE_PROVIDER_SITE_OTHER): Payer: 59 | Admitting: Cardiology

## 2019-12-22 VITALS — BP 116/68 | HR 77 | Temp 96.3°F | Ht 67.0 in | Wt 271.0 lb

## 2019-12-22 DIAGNOSIS — D509 Iron deficiency anemia, unspecified: Secondary | ICD-10-CM

## 2019-12-22 DIAGNOSIS — I5032 Chronic diastolic (congestive) heart failure: Secondary | ICD-10-CM | POA: Diagnosis not present

## 2019-12-22 DIAGNOSIS — I110 Hypertensive heart disease with heart failure: Secondary | ICD-10-CM | POA: Diagnosis not present

## 2019-12-22 MED ORDER — LOSARTAN POTASSIUM-HCTZ 100-12.5 MG PO TABS: 1.0000 | ORAL_TABLET | Freq: Every morning | ORAL | 5 refills | Status: DC

## 2019-12-22 MED ORDER — FUROSEMIDE 40 MG PO TABS: ORAL_TABLET | ORAL | 5 refills | Status: DC

## 2019-12-22 MED ORDER — METOPROLOL SUCCINATE ER 25 MG PO TB24: ORAL_TABLET | ORAL | 5 refills | Status: DC

## 2019-12-22 MED ORDER — AMLODIPINE BESYLATE 5 MG PO TABS: ORAL_TABLET | ORAL | 5 refills | Status: DC

## 2019-12-22 MED ORDER — POTASSIUM CHLORIDE ER 10 MEQ PO TBCR: EXTENDED_RELEASE_TABLET | ORAL | 5 refills | Status: DC

## 2019-12-22 MED ORDER — HYDRALAZINE HCL 25 MG PO TABS: 25.0000 mg | ORAL_TABLET | Freq: Four times a day (QID) | ORAL | 5 refills | Status: DC

## 2019-12-22 NOTE — Patient Instructions (Signed)
Medication Instructions:  Your physician has recommended you make the following change in your medication: DISCONTINUE TAKING IRON  *If you need a refill on your cardiac medications before your next appointment, please call your pharmacy*   Lab Work: BMP AND CBC If you have labs (blood work) drawn today and your tests are completely normal, you will receive your results only by: Marland Kitchen MyChart Message (if you have MyChart) OR . A paper copy in the mail If you have any lab test that is abnormal or we need to change your treatment, we will call you to review the results.   Testing/Procedures: NONE   Follow-Up: At [facility], you and your health needs are our priority.  As part of our continuing mission to provide you with exceptional heart care, we have created designated Provider Care Teams.  These Care Teams include your primary Cardiologist (physician) and Advanced Practice Providers (APPs -  Physician Assistants and Nurse Practitioners) who all work together to provide you with the care you need, when you need it.  We recommend signing up for the patient portal called "MyChart".  Sign up information is provided on this After Visit Summary.  MyChart is used to connect with patients for Virtual Visits (Telemedicine).  Patients are able to view lab/test results, encounter notes, upcoming appointments, etc.  Non-urgent messages can be sent to your provider as well.   To learn more about what you can do with MyChart, go to [email or web address].    Your next appointment:   6 month(s)  The format for your next appointment:   In Person  Provider:   Shirlee More, MD   Other Instructions

## 2019-12-23 LAB — CBC
Hematocrit: 40.5 % (ref 34.0–46.6)
Hemoglobin: 14 g/dL (ref 11.1–15.9)
MCH: 31.7 pg (ref 26.6–33.0)
MCHC: 34.6 g/dL (ref 31.5–35.7)
MCV: 92 fL (ref 79–97)
Platelets: 218 10*3/uL (ref 150–450)
RBC: 4.41 x10E6/uL (ref 3.77–5.28)
RDW: 12.4 % (ref 11.7–15.4)
WBC: 5.7 10*3/uL (ref 3.4–10.8)

## 2019-12-23 LAB — BASIC METABOLIC PANEL WITH GFR
BUN/Creatinine Ratio: 18 (ref 9–23)
BUN: 14 mg/dL (ref 6–24)
CO2: 29 mmol/L (ref 20–29)
Calcium: 9.6 mg/dL (ref 8.7–10.2)
Chloride: 100 mmol/L (ref 96–106)
Creatinine, Ser: 0.8 mg/dL (ref 0.57–1.00)
GFR calc Af Amer: 101 mL/min/{1.73_m2}
GFR calc non Af Amer: 87 mL/min/{1.73_m2}
Glucose: 98 mg/dL (ref 65–99)
Potassium: 3.7 mmol/L (ref 3.5–5.2)
Sodium: 143 mmol/L (ref 134–144)

## 2019-12-23 NOTE — Addendum Note (Signed)
Addended by: Linton Ham on: 12/23/2019 04:31 PM   Modules accepted: Orders

## 2020-03-31 ENCOUNTER — Ambulatory Visit: Payer: 59 | Admitting: Gynecologic Oncology

## 2020-04-04 ENCOUNTER — Encounter: Payer: Self-pay | Admitting: Gynecologic Oncology

## 2020-04-04 ENCOUNTER — Other Ambulatory Visit: Payer: Self-pay

## 2020-04-04 ENCOUNTER — Inpatient Hospital Stay: Payer: Self-pay | Attending: Gynecologic Oncology | Admitting: Gynecologic Oncology

## 2020-04-04 VITALS — BP 130/84 | HR 78 | Temp 98.4°F | Resp 16 | Ht 67.0 in | Wt 268.1 lb

## 2020-04-04 DIAGNOSIS — Z79899 Other long term (current) drug therapy: Secondary | ICD-10-CM | POA: Insufficient documentation

## 2020-04-04 DIAGNOSIS — Z90722 Acquired absence of ovaries, bilateral: Secondary | ICD-10-CM | POA: Insufficient documentation

## 2020-04-04 DIAGNOSIS — I5032 Chronic diastolic (congestive) heart failure: Secondary | ICD-10-CM | POA: Insufficient documentation

## 2020-04-04 DIAGNOSIS — Z90710 Acquired absence of both cervix and uterus: Secondary | ICD-10-CM | POA: Insufficient documentation

## 2020-04-04 DIAGNOSIS — I110 Hypertensive heart disease with heart failure: Secondary | ICD-10-CM | POA: Insufficient documentation

## 2020-04-04 DIAGNOSIS — K219 Gastro-esophageal reflux disease without esophagitis: Secondary | ICD-10-CM | POA: Insufficient documentation

## 2020-04-04 DIAGNOSIS — Z7982 Long term (current) use of aspirin: Secondary | ICD-10-CM | POA: Insufficient documentation

## 2020-04-04 DIAGNOSIS — Z87891 Personal history of nicotine dependence: Secondary | ICD-10-CM | POA: Insufficient documentation

## 2020-04-04 DIAGNOSIS — C538 Malignant neoplasm of overlapping sites of cervix uteri: Secondary | ICD-10-CM

## 2020-04-04 DIAGNOSIS — C539 Malignant neoplasm of cervix uteri, unspecified: Secondary | ICD-10-CM | POA: Insufficient documentation

## 2020-04-04 NOTE — Patient Instructions (Signed)
Everything on your exam is great today!  Please call your primary care provider to schedule an appointment to talk about your leg swelling and to get a mammogram scheduled.  I will see you in 6 months unless you develop any symptoms before that (such as vaginal bleeding, pelvic pain, decreased appetite).

## 2020-04-04 NOTE — Progress Notes (Signed)
Gynecologic Oncology Return Clinic Visit  04/04/20  Reason for Visit: Surveillance in the setting of early cervical cancer  Treatment History: Oncology History  Malignant neoplasm of cervix [facility])   Initial Diagnosis   Malignant neoplasm of cervix ([place])   06/25/2018 Miscellaneous   Pap - HSIL   07/15/2018 Initial Biopsy   COLPO with biopsies: 1. 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 -NO INVASIVE NEOPLASM IDENTIFIED. -SEE COMMENT. 2. Cervix, biopsy, 3 o'clock -CERVICAL TRANSFORMATION ZONE MUCOSA WITH CIN-III (SEVERE SQUAMOUS DYSPLASIA/SQUAMOUS CARCINOMA IN SITU; HIGH GRADE SQUAMOUS INTRAEPITHELIAL LESION). -NO INVASIVE NEOPLASM IDENTIFIED. -SEE COMMENT. 3. Cervix, biopsy, 6 o'clock -FRAGMENTED SQUAMOUS MUCOSA WITH CIN-III (SEVERE SQUAMOUS DYSPLASIA/SQUAMOUS CARCINOMA IN SITU; HIGH GRADE SQUAMOUS INTRAEPITHELIAL LESION). -CANNOT RULE OUT HIGHER GRADE LESION. 4. Cervix, biopsy, 9 o'clock -FOCI OF SUBMUCOSAL INVASIVE SQUAMOUS CELL CARCINOMA IN A SETTING OF FRAGMENTED CERVICAL TRANSFORMATION ZONE MUCOSA WITH CIN-III (SEVERE SQUAMOUS DYSPLASIA/SQUAMOUS CARCINOMA IN SITU; HIGH GRADE SQUAMOUS INTRAEPITHELIAL LESION). -SEE COMMENT. The portion of the endocervical stroma containing invasive squamous cell carcinoma is heavily inflamed, unoriented and devoid of surface mucosa. In this setting, the depth of invasion cannot be accurately determined. 5. Endocervix, curettage - FRAGMENTED CERVICAL TRANSFORMATION ZONE MUCOSA WITH CIN-III (SEVERE SQUAMOUS DYSPLASIA/SQUAMOUS CARCINOMA IN SITU; HIGH GRADE SQUAMOUS INTRAEPITHELIAL LESION). - CERVICAL STROMA NOT PRESENT FOR EVALUATION. - SEE COMMENT.   08/25/2018 Surgery   [facility] 1. Cervix, cone - INVASIVE SQUAMOUS CELL CARCINOMA, WELL-DIFFERENTIATED. - TUMOR SPANS 3 MM IN WIDTH, 2 MM IN LENGTH, AND 2 MM IN DEPTH. -  BACKGROUND HIGH GRADE SQUAMOUS INTRAEPITHELIAL LESION, CIN-III (SEVERE DYSPLASIA/CIS) WITH ENDOCERVICAL GLAND INVOLVEMENT. - MARGINS ARE NEGATIVE FOR INVASIVE CARCINOMA. - CIN-III EXTENDS TO ENDOCERVICAL MARGIN BROADLY. - SEE COMMENT. 2. Endocervix, curettage - DETACHED FRAGMENTS OF AT LEAST HIGH GRADE SQUAMOUS INTRAEPITHELIAL LESION, CIN-III (SEVERE DYSPLASIA/CIS). - SEE COMMENT. Microscopic Comment 1. The invasive carcinoma is located at roughly 10:00 position and is 2 mm from the ectocervical margin. CIN-III is present at the endocervical margin from roughly 12:00-3:00. An oncology table is not required for cervical cone excisions. 2. Fragments of at least CIN-III are detached and not associated with stroma, thus invasion can not be assessed. Addendum showed no LVSI   10/14/2018 Surgery   TRH/BS - no residual CA or dysplasia   10/14/2018 Pathologic Stage   Stage Ia1 SCC of the cervix, no LVSI     Interval History: The patient reports overall doing well since her last visit.  She denies any vaginal bleeding or discharge.  She endorses a good appetite without nausea or emesis.  She notes normal bowel and bladder function.  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  Her edema improves in the morning after she has been laying flat with her feet up for bed.  Since her last visit with me, she has had 3 episodes of feeling like her "stomach was upset".  This feeling completely resolved after she took medication for gas pain and drink more water.  She has not seen her PCP since the pandemic started.  She has not called to get a referral for mammogram.  She has noticed now that she has breast tenderness about once a month as she did when she used to have her cycles before her hysterectomy.  Has not gotten the Covid vaccine.  Past Medical/Surgical History: Past Medical History:  Diagnosis Date  .  Cervical  cancer [facility])    gyn oncologist-  dr Gerarda Fraction--  bx 08-25-2018  ,  invasive SCC, Stage IA1  . Chronic diastolic (congestive) heart failure [facility])    cardiologist-  dr Bettina Gavia  . GERD (gastroesophageal reflux disease)    per pt just drinks water  . Hypertension   . IDA (iron deficiency anemia)     Past Surgical History:  Procedure Laterality Date  . BREAST SURGERY Left age 77   Lumpectomy-benign  . CERVICAL CONIZATION W/BX N/A 08/25/2018   Procedure: CONIZATION CERVIX WITH BIOPSY AND ENDOCERVICAL CURETTAGE;  Surgeon: Isabel Caprice, MD;  Location: [facility];  Service: Gynecology;  Laterality: N/A;  . LEFT HEART CATH AND CORONARY ANGIOGRAPHY N/A 08/13/2018   Procedure: LEFT HEART CATH AND CORONARY ANGIOGRAPHY;  Surgeon: Troy Sine, MD;  Location: [place] [facility];  Service: Cardiovascular;  Laterality: N/A;    Family History  Problem Relation Age of Onset  . Hypertension Mother   . Congestive Heart Failure Mother   . Hypertension Father   . Diabetes Father   . Cancer Father        Prostate  . Kidney cancer Son        son died from disease    Social History   Socioeconomic History  . Marital status: Single    Spouse name: Not on file  . Number of children: Not on file  . Years of education: Not on file  . Highest education level: Not on file  Occupational History  . Not on file  Tobacco Use  . Smoking status: Former Smoker    Packs/day: 0.50    Years: 20.00    Pack years: 10.00    Types: Cigarettes    Quit date: 07/15/2018    Years since quitting: 1.7  . Smokeless tobacco: Never Used  Vaping Use  . Vaping Use: Never used  Substance and Sexual Activity  . Alcohol use: Not Currently  . Drug use: Never  . Sexual activity: Not Currently    Comment: 1st intercourse 49 yo-More than 5 partners  Other Topics Concern  . Not on file  Social History Narrative  . Not on file   Social Determinants of Health   Financial Resource Strain:   .  Difficulty of Paying Living Expenses:   Food Insecurity:   . Worried About Charity fundraiser in the Last Year:   . Arboriculturist in the Last Year:   Transportation Needs:   . Film/video editor (Medical):   Marland Kitchen Lack of Transportation (Non-Medical):   Physical Activity:   . Days of Exercise per Week:   . Minutes of Exercise per Session:   Stress:   . Feeling of Stress :   Social Connections:   . Frequency of Communication with Friends and Family:   . Frequency of Social Gatherings with Friends and Family:   . Attends Religious Services:   . Active Member of Clubs or Organizations:   . Attends Archivist Meetings:   Marland Kitchen Marital Status:     Current Medications:  Current Outpatient Medications:  .  albuterol (PROVENTIL HFA;VENTOLIN HFA) 108 (90 Base) MCG/ACT inhaler, Inhale 1 puff into the lungs every 6 (six) hours as needed for wheezing or shortness of breath., Disp: 8 Inhaler, Rfl: 3 .  amLODipine (NORVASC) 5 MG tablet, TAKE 1 TABLET BY MOUTH ONCE EVERY DAY, Disp: 30 tablet, Rfl: 5 .  Ascorbic Acid (VITAMIN C) 1000 MG  tablet, Take 1,000 mg by mouth daily. , Disp: , Rfl:  .  aspirin EC 81 MG tablet, Take 81 mg by mouth daily., Disp: , Rfl:  .  Cholecalciferol (VITAMIN D3) 2000 units capsule, Take 2,000 Units by mouth daily., Disp: , Rfl:  .  ferrous sulfate 325 (65 FE) MG tablet, Take 325 mg by mouth daily. , Disp: , Rfl:  .  furosemide (LASIX) 40 MG tablet, TAKE 1 TABLET BY MOUTH TWICE DAILY AND AN EXTRA TABLET ON MONDAY, WEDNESDAY AND FRIDAY, Disp: 80 tablet, Rfl: 5 .  hydrALAZINE (APRESOLINE) 25 MG tablet, Take 1 tablet (25 mg total) by mouth 4 (four) times daily., Disp: 120 tablet, Rfl: 5 .  losartan-hydrochlorothiazide (HYZAAR) 100-12.5 MG tablet, Take 1 tablet by mouth every morning., Disp: 30 tablet, Rfl: 5 .  metoprolol succinate (TOPROL-XL) 25 MG 24 hr tablet, TAKE 1 TABLET BY MOUTH ONCE EVERY DAY, Disp: 30 tablet, Rfl: 5 .  potassium chloride (KLOR-CON) 10 MEQ  tablet, TAKE 1 TABLET BY MOUTH ONCE EVERY DAY, Disp: 30 tablet, Rfl: 5  Review of Systems: + leg swelling Denies appetite changes, fevers, chills, fatigue, unexplained weight changes. Denies hearing loss, neck lumps or masses, mouth sores, ringing in ears or voice changes. Denies cough or wheezing.  Denies shortness of breath. Denies chest pain or palpitations.  Denies abdominal distention, pain, blood in stools, constipation, diarrhea, nausea, vomiting, or early satiety. Denies pain with intercourse, dysuria, frequency, hematuria or incontinence. Denies hot flashes, pelvic pain, vaginal bleeding or vaginal discharge.   Denies joint pain, back pain or muscle pain/cramps. Denies itching, rash, or wounds. Denies dizziness, headaches, numbness or seizures. Denies swollen lymph nodes or glands, denies easy bruising or bleeding. Denies anxiety, depression, confusion, or decreased concentration.  Physical Exam: BP 130/84 (BP Location: Left Arm, Patient Position: Sitting)   Pulse 78   Temp 98.4 F (36.9 C) (Oral)   Resp 16   Ht 5\' 7"  (1.702 m)   Wt 268 lb 2 oz (121.6 kg)   LMP  (LMP Unknown)   SpO2 100%   BMI 41.99 kg/m  General: Alert, oriented, no acute distress. HEENT: Normocephalic, atraumatic, sclera anicteric. Abdomen: Obese, soft, nontender.  Normoactive bowel sounds.  No masses or hepatosplenomegaly appreciated.  Well-healed laparoscopic incisions. Extremities: Grossly normal range of motion.  Warm, well perfused.  1+ edema bilaterally to mid shin. Skin: No rashes or lesions noted. Lymphatics: No cervical, supraclavicular, or inguinal adenopathy. GU: Normal appearing external genitalia without erythema, excoriation, or lesions.  Speculum exam reveals well rugated vaginal mucosa, scant discharge.  No lesions or masses.  Bimanual exam reveals cuff intact, no masses or nodularity.  Rectovaginal exam confirms these findings.  Laboratory & Radiologic Studies: None new  Assessment  & Plan: Kayla Carey is a 49 y.o. 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  Patient is without evidence of disease on exam today.  I have encouraged her to call her primary care provider given her increasing lower extremity edema is a think she may need to go up on her diuretic.  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  She also will plan to reach out to her PCP about getting a referral for mammogram.  We discussed her concerns about the Covid vaccine in light of her cardiac disease.  I would  still recommend that she schedule the vaccine given the risk to her if she were to contract Covid.  Per [facility] cervical cancer surveillance recommendations in the setting of low risk early stage disease, we will plan for 72-month visits with pelvic exam for 2 years after treatment, and then will transition to yearly visits.  We will continue performing yearly Pap test. Precautions reviewed with the patient that should prompt a call to our clinic sooner than her next scheduled appointment.   20 minutes of total time was spent for this patient encounter, including preparation, face-to-face counseling with the patient and coordination of care, and documentation of the encounter.  Jeral Pinch, MD  Division of Gynecologic Oncology  Department of Obstetrics and Gynecology  [facility]

## 2020-06-22 ENCOUNTER — Other Ambulatory Visit: Payer: Self-pay | Admitting: Cardiology

## 2020-06-28 ENCOUNTER — Ambulatory Visit: Payer: 59 | Admitting: Cardiology

## 2020-06-29 ENCOUNTER — Telehealth: Payer: Self-pay | Admitting: Cardiology

## 2020-06-29 NOTE — Telephone Encounter (Signed)
Lets have her hold hydralazine as systolic blood pressures are less than 120 to avoid hypotension

## 2020-06-29 NOTE — Telephone Encounter (Signed)
Patient calling to speak with Dr. Bettina Gavia. There is a language barrier and I tried my best to write down what I could understand. Patient states that she is still in quarantine from covid, she is on day 52. She states that she is still feeling very week and every time she takes her BP medications she feels worse. She states her BP is getting as low as 96/32. She wants to know what Dr. Bettina Gavia recommends. Please advise.

## 2020-06-29 NOTE — Telephone Encounter (Signed)
Spoke to patient just now and let her know Dr. Joya Gaskins recommendations. She verbalizes understanding and thanks me for the call back.    Encouraged patient to call back with any questions or concerns.

## 2020-07-10 NOTE — Progress Notes (Signed)
Cardiology Office Note:    Date:  07/11/2020   ID:  Kayla Carey, DOB 1970-11-12, MRN [id number]  PCP:  Benito Mccreedy, MD  Cardiologist:  Shirlee More, MD    Referring MD: Forrest Moron, MD    ASSESSMENT:    1. Hypotension due to drugs   2. Hypertensive heart disease with chronic diastolic congestive heart failure ([place])   3. Chronic diastolic (congestive) heart failure ([facility])    PLAN:    In order of problems listed above:  1. Improved after withdrawal of hydralazine continue current antihypertensive regimen amlodipine furosemide metoprolol and ARB thiazide diuretic.  Recheck renal function today.  Labile hypertension is not uncommon during convalescence of COVID-19.  Also check an EKG in the office today along with CBC.  I asked her if she notices systolics greater than 993 to contact me with slowly reintroduce hydralazine as she recovers I do not think she needs to wear an ambulatory heart rhythm monitor. 2. Stable compensated she has no edema   Next appointment: 6 months   Medication Adjustments/Labs and Tests Ordered: Current medicines are reviewed at length with the patient today.  Concerns regarding medicines are outlined above.  Orders Placed This Encounter  Procedures  . Basic metabolic panel  . CBC  . EKG 12-Lead   No orders of the defined types were placed in this encounter.   No chief complaint on file.   History of Present Illness:    Kayla Carey is a 49 y.o. female with a hx of hypertensive heart disease with diastolic heart failure and anemia last seen 12/22/2019. Compliance with diet, lifestyle and medications: Yes  She had followed her office she was having symptomatic hypotension after mild Covid not requiring hospitalization and her hydralazine was discontinued.  Since that time she feels better blood pressures in range today but still is feeling weak.  She did not have syncope no palpitation chest pain or edema. Past Medical History:    Diagnosis Date  . Abnormal electrocardiogram (ECG) (EKG) 08/11/2018  . Abnormal myocardial perfusion study 08/11/2018  . Cervical cancer [facility])    gyn oncologist-  dr Gerarda Fraction--  bx 08-25-2018  ,  invasive SCC, Stage IA1  . Chest pain 08/01/2018  . Chronic diastolic (congestive) heart failure [facility])    cardiologist-  dr Bettina Gavia  . GERD (gastroesophageal reflux disease)    per pt just drinks water  . Hypertension   . Hypertensive heart disease 08/01/2018  . IDA (iron deficiency anemia)   . Malignant neoplasm of cervix [facility])     Past Surgical History:  Procedure Laterality Date  . BREAST SURGERY Left age 22   Lumpectomy-benign  . CERVICAL CONIZATION W/BX N/A 08/25/2018   Procedure: CONIZATION CERVIX WITH BIOPSY AND ENDOCERVICAL CURETTAGE;  Surgeon: Isabel Caprice, MD;  Location: [facility];  Service: Gynecology;  Laterality: N/A;  . LEFT HEART CATH AND CORONARY ANGIOGRAPHY N/A 08/13/2018   Procedure: LEFT HEART CATH AND CORONARY ANGIOGRAPHY;  Surgeon: Troy Sine, MD;  Location: [place] [facility];  Service: Cardiovascular;  Laterality: N/A;    Current Medications: Current Meds  Medication Sig  . amLODipine (NORVASC) 5 MG tablet TAKE 1 TABLET BY MOUTH ONCE EVERY DAY  . Ascorbic Acid (VITAMIN C) 1000 MG tablet Take 1,000 mg by mouth daily.   Marland Kitchen aspirin EC 81 MG tablet Take 81 mg by mouth daily.  . Cholecalciferol (VITAMIN D3) 2000 units capsule Take 2,000 Units by mouth daily.  . furosemide (LASIX)  40 MG tablet TAKE 1 TABLET BY MOUTH TWICE DAILY AND AN EXTRA TABLET ON MONDAY, WEDNESDAY AND FRIDAY  . hydrALAZINE (APRESOLINE) 25 MG tablet Take 1 tablet (25 mg total) by mouth 4 (four) times daily. (Patient taking differently: Take 25 mg by mouth at bedtime. )  . losartan-hydrochlorothiazide (HYZAAR) 100-12.5 MG tablet TAKE 1 TABLET BY MOUTH EVERY MORNING  . metoprolol succinate (TOPROL-XL) 25 MG 24 hr tablet TAKE 1 TABLET BY MOUTH ONCE EVERY DAY  . potassium chloride  (KLOR-CON) 10 MEQ tablet TAKE 1 TABLET BY MOUTH ONCE EVERY DAY     Allergies:   Patient has no known allergies.   Social History   Socioeconomic History  . Marital status: Single    Spouse name: Not on file  . Number of children: Not on file  . Years of education: Not on file  . Highest education level: Not on file  Occupational History  . Not on file  Tobacco Use  . Smoking status: Former Smoker    Packs/day: 0.50    Years: 20.00    Pack years: 10.00    Types: Cigarettes    Quit date: 07/15/2018    Years since quitting: 1.9  . Smokeless tobacco: Never Used  Vaping Use  . Vaping Use: Never used  Substance and Sexual Activity  . Alcohol use: Not Currently  . Drug use: Never  . Sexual activity: Not Currently    Comment: 1st intercourse 49 yo-More than 5 partners  Other Topics Concern  . Not on file  Social History Narrative  . Not on file   Social Determinants of Health   Financial Resource Strain:   . Difficulty of Paying Living Expenses: Not on file  Food Insecurity:   . Worried About Charity fundraiser in the Last Year: Not on file  . Ran Out of Food in the Last Year: Not on file  Transportation Needs:   . Lack of Transportation (Medical): Not on file  . Lack of Transportation (Non-Medical): Not on file  Physical Activity:   . Days of Exercise per Week: Not on file  . Minutes of Exercise per Session: Not on file  Stress:   . Feeling of Stress : Not on file  Social Connections:   . Frequency of Communication with Friends and Family: Not on file  . Frequency of Social Gatherings with Friends and Family: Not on file  . Attends Religious Services: Not on file  . Active Member of Clubs or Organizations: Not on file  . Attends Archivist Meetings: Not on file  . Marital Status: Not on file     Family History: The patient's family history includes Cancer in her father; Congestive Heart Failure in her mother; Diabetes in her father; Hypertension in her  father and mother; Kidney cancer in her son. ROS:   Please see the history of present illness.    All other systems reviewed and are negative.  EKGs/Labs/Other Studies Reviewed:    The following studies were reviewed today:  EKG:  EKG ordered today and personally reviewed.  The ekg ordered today demonstrates sinus rhythm normal AV nodal conduction LVH and repolarization similar to 12/23/2019  Recent Labs: 12/22/2019: BUN 14; Creatinine, Ser 0.80; Hemoglobin 14.0; Platelets 218; Potassium 3.7; Sodium 143  Recent Lipid Panel    Component Value Date/Time   CHOL 184 05/27/2018 1335   TRIG 54 05/27/2018 1335   HDL 48 05/27/2018 1335   CHOLHDL 3.8 05/27/2018 1335   LDLCALC  125 (H) 05/27/2018 1335    Physical Exam:    VS:  BP 130/82   Pulse 80   Ht 5\' 7"  (1.702 m)   Wt 265 lb 1.9 oz (120.3 kg)   LMP  (LMP Unknown)   SpO2 98%   BMI 41.52 kg/m     Wt Readings from Last 3 Encounters:  07/11/20 265 lb 1.9 oz (120.3 kg)  04/04/20 268 lb 2 oz (121.6 kg)  12/22/19 271 lb (122.9 kg)     GEN:  Well nourished, well developed in no acute distress HEENT: Normal NECK: No JVD; No carotid bruits LYMPHATICS: No lymphadenopathy CARDIAC: COVID-19 RRR, no murmurs, rubs, gallops RESPIRATORY:  Clear to auscultation without rales, wheezing or rhonchi  ABDOMEN: Soft, non-tender, non-distended MUSCULOSKELETAL:  No edema; No deformity  SKIN: Warm and dry NEUROLOGIC:  Alert and oriented x 3 PSYCHIATRIC:  Normal affect    Signed, Shirlee More, MD  07/11/2020 4:03 PM    [facility]

## 2020-07-11 ENCOUNTER — Ambulatory Visit (INDEPENDENT_AMBULATORY_CARE_PROVIDER_SITE_OTHER): Payer: Self-pay | Admitting: Cardiology

## 2020-07-11 ENCOUNTER — Other Ambulatory Visit: Payer: Self-pay

## 2020-07-11 VITALS — BP 130/82 | HR 80 | Ht 67.0 in | Wt 265.1 lb

## 2020-07-11 DIAGNOSIS — I952 Hypotension due to drugs: Secondary | ICD-10-CM

## 2020-07-11 DIAGNOSIS — I110 Hypertensive heart disease with heart failure: Secondary | ICD-10-CM

## 2020-07-11 DIAGNOSIS — I10 Essential (primary) hypertension: Secondary | ICD-10-CM | POA: Insufficient documentation

## 2020-07-11 DIAGNOSIS — I5032 Chronic diastolic (congestive) heart failure: Secondary | ICD-10-CM | POA: Insufficient documentation

## 2020-07-11 NOTE — Patient Instructions (Signed)
Medication Instructions:  Your physician recommends that you continue on your current medications as directed. Please refer to the Current Medication list given to you today.  *If you need a refill on your cardiac medications before your next appointment, please call your pharmacy*   Lab Work: Your physician recommends that you return for lab work in: TODAY BMP, CBC If you have labs (blood work) drawn today and your tests are completely normal, you will receive your results only by: Marland Kitchen MyChart Message (if you have MyChart) OR . A paper copy in the mail If you have any lab test that is abnormal or we need to change your treatment, we will call you to review the results.   Testing/Procedures: None   Follow-Up: At [facility], you and your health needs are our priority.  As part of our continuing mission to provide you with exceptional heart care, we have created designated Provider Care Teams.  These Care Teams include your primary Cardiologist (physician) and Advanced Practice Providers (APPs -  Physician Assistants and Nurse Practitioners) who all work together to provide you with the care you need, when you need it.  We recommend signing up for the patient portal called "MyChart".  Sign up information is provided on this After Visit Summary.  MyChart is used to connect with patients for Virtual Visits (Telemedicine).  Patients are able to view lab/test results, encounter notes, upcoming appointments, etc.  Non-urgent messages can be sent to your provider as well.   To learn more about what you can do with MyChart, go to [email or web address].    Your next appointment:   6 week(s)  The format for your next appointment:   In Person  Provider:   Shirlee More, MD   Other Instructions

## 2020-07-22 ENCOUNTER — Other Ambulatory Visit: Payer: Self-pay | Admitting: Cardiology

## 2020-08-08 ENCOUNTER — Emergency Department (HOSPITAL_BASED_OUTPATIENT_CLINIC_OR_DEPARTMENT_OTHER): Payer: 59

## 2020-08-08 ENCOUNTER — Other Ambulatory Visit: Payer: Self-pay

## 2020-08-08 ENCOUNTER — Emergency Department (HOSPITAL_BASED_OUTPATIENT_CLINIC_OR_DEPARTMENT_OTHER)
Admission: EM | Admit: 2020-08-08 | Discharge: 2020-08-09 | Disposition: A | Discharge status: Home / Self Care | Payer: 59 | Attending: Emergency Medicine | Admitting: Emergency Medicine

## 2020-08-08 ENCOUNTER — Encounter (HOSPITAL_BASED_OUTPATIENT_CLINIC_OR_DEPARTMENT_OTHER): Payer: Self-pay

## 2020-08-08 DIAGNOSIS — S299XXA Unspecified injury of thorax, initial encounter: Secondary | ICD-10-CM | POA: Diagnosis present

## 2020-08-08 DIAGNOSIS — Y92830 Public park as the place of occurrence of the external cause: Secondary | ICD-10-CM | POA: Insufficient documentation

## 2020-08-08 DIAGNOSIS — Z79899 Other long term (current) drug therapy: Secondary | ICD-10-CM | POA: Insufficient documentation

## 2020-08-08 DIAGNOSIS — S20212A Contusion of left front wall of thorax, initial encounter: Secondary | ICD-10-CM | POA: Insufficient documentation

## 2020-08-08 DIAGNOSIS — Z7982 Long term (current) use of aspirin: Secondary | ICD-10-CM | POA: Diagnosis not present

## 2020-08-08 DIAGNOSIS — I110 Hypertensive heart disease with heart failure: Secondary | ICD-10-CM | POA: Insufficient documentation

## 2020-08-08 DIAGNOSIS — Z87891 Personal history of nicotine dependence: Secondary | ICD-10-CM | POA: Diagnosis not present

## 2020-08-08 DIAGNOSIS — S40211A Abrasion of right shoulder, initial encounter: Secondary | ICD-10-CM | POA: Diagnosis not present

## 2020-08-08 DIAGNOSIS — S70312A Abrasion, left thigh, initial encounter: Secondary | ICD-10-CM | POA: Insufficient documentation

## 2020-08-08 DIAGNOSIS — Z8541 Personal history of malignant neoplasm of cervix uteri: Secondary | ICD-10-CM | POA: Diagnosis not present

## 2020-08-08 DIAGNOSIS — T148XXA Other injury of unspecified body region, initial encounter: Secondary | ICD-10-CM

## 2020-08-08 DIAGNOSIS — W228XXA Striking against or struck by other objects, initial encounter: Secondary | ICD-10-CM | POA: Insufficient documentation

## 2020-08-08 DIAGNOSIS — Y9389 Activity, other specified: Secondary | ICD-10-CM | POA: Insufficient documentation

## 2020-08-08 DIAGNOSIS — I5032 Chronic diastolic (congestive) heart failure: Secondary | ICD-10-CM | POA: Insufficient documentation

## 2020-08-08 MED ORDER — NAPROXEN 375 MG PO TABS
375.0000 mg | ORAL_TABLET | Freq: Two times a day (BID) | ORAL | 0 refills | Status: DC
Start: 2020-08-08 — End: 2021-04-02

## 2020-08-08 MED ORDER — FENTANYL CITRATE (PF) 100 MCG/2ML IJ SOLN
50.0000 ug | Freq: Once | INTRAMUSCULAR | Status: AC
  Administered 2020-08-08: 50 ug via INTRAVENOUS
  Filled 2020-08-08: qty 2

## 2020-08-08 MED ORDER — KETOROLAC TROMETHAMINE 30 MG/ML IJ SOLN
30.0000 mg | Freq: Once | INTRAMUSCULAR | Status: AC
  Administered 2020-08-08: 30 mg via INTRAVENOUS
  Filled 2020-08-08: qty 1

## 2020-08-08 MED ORDER — HYDROCODONE-ACETAMINOPHEN 5-325 MG PO TABS
1.0000 | ORAL_TABLET | Freq: Four times a day (QID) | ORAL | 0 refills | Status: DC | PRN
Start: 2020-08-08 — End: 2020-08-18

## 2020-08-08 MED ORDER — HYDROCODONE-ACETAMINOPHEN 5-325 MG PO TABS
1.0000 | ORAL_TABLET | Freq: Once | ORAL | Status: AC
  Administered 2020-08-08: 1 via ORAL
  Filled 2020-08-08: qty 1

## 2020-08-08 NOTE — ED Notes (Signed)
Skin warm and dry, pt resting with eyes closed. Pain with movement of left arm. Provider at bedside. VSS. No emesis. Denies nausea. CMS intact. No deformities noted.
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
## 2020-08-08 NOTE — ED Notes (Signed)
Pt resting with eyes closed, calm, skin warm and dry. Pt denies nausea. Awaken from sleep by RN, rates pain "10/10"

## 2020-08-08 NOTE — ED Notes (Signed)
Pt sitting on side of bed, large emesis. Pt states" pain is so bad". Provider at bedside.
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
## 2020-08-18 ENCOUNTER — Emergency Department (HOSPITAL_COMMUNITY)
Admission: EM | Admit: 2020-08-18 | Discharge: 2020-08-18 | Disposition: A | Discharge status: Home / Self Care | Payer: 59 | Attending: Emergency Medicine | Admitting: Emergency Medicine

## 2020-08-18 ENCOUNTER — Other Ambulatory Visit: Payer: Self-pay

## 2020-08-18 ENCOUNTER — Emergency Department (HOSPITAL_COMMUNITY): Payer: 59

## 2020-08-18 ENCOUNTER — Encounter (HOSPITAL_COMMUNITY): Payer: Self-pay | Admitting: Emergency Medicine

## 2020-08-18 DIAGNOSIS — T1490XA Injury, unspecified, initial encounter: Secondary | ICD-10-CM

## 2020-08-18 DIAGNOSIS — Z7982 Long term (current) use of aspirin: Secondary | ICD-10-CM | POA: Diagnosis not present

## 2020-08-18 DIAGNOSIS — Z79899 Other long term (current) drug therapy: Secondary | ICD-10-CM | POA: Diagnosis not present

## 2020-08-18 DIAGNOSIS — I110 Hypertensive heart disease with heart failure: Secondary | ICD-10-CM | POA: Diagnosis not present

## 2020-08-18 DIAGNOSIS — S2242XA Multiple fractures of ribs, left side, initial encounter for closed fracture: Secondary | ICD-10-CM | POA: Diagnosis not present

## 2020-08-18 DIAGNOSIS — S299XXA Unspecified injury of thorax, initial encounter: Secondary | ICD-10-CM | POA: Diagnosis present

## 2020-08-18 DIAGNOSIS — Z87891 Personal history of nicotine dependence: Secondary | ICD-10-CM | POA: Insufficient documentation

## 2020-08-18 DIAGNOSIS — I5032 Chronic diastolic (congestive) heart failure: Secondary | ICD-10-CM | POA: Diagnosis not present

## 2020-08-18 DIAGNOSIS — Z8541 Personal history of malignant neoplasm of cervix uteri: Secondary | ICD-10-CM | POA: Diagnosis not present

## 2020-08-18 DIAGNOSIS — Y92830 Public park as the place of occurrence of the external cause: Secondary | ICD-10-CM | POA: Diagnosis not present

## 2020-08-18 DIAGNOSIS — W228XXA Striking against or struck by other objects, initial encounter: Secondary | ICD-10-CM | POA: Diagnosis not present

## 2020-08-18 LAB — COMPREHENSIVE METABOLIC PANEL
ALT: 18 U/L (ref 0–44)
AST: 23 U/L (ref 15–41)
Albumin: 4.1 g/dL (ref 3.5–5.0)
Alkaline Phosphatase: 60 U/L (ref 38–126)
Anion gap: 10 (ref 5–15)
BUN: 13 mg/dL (ref 6–20)
CO2: 26 mmol/L (ref 22–32)
Calcium: 9.8 mg/dL (ref 8.9–10.3)
Chloride: 101 mmol/L (ref 98–111)
Creatinine, Ser: 0.63 mg/dL (ref 0.44–1.00)
GFR, Estimated: >60 mL/min (ref >60)
Glucose, Bld: 117 mg/dL — ABNORMAL HIGH (ref 70–99)
Potassium: 3.5 mmol/L (ref 3.5–5.1)
Sodium: 137 mmol/L (ref 135–145)
Total Bilirubin: 0.8 mg/dL (ref 0.3–1.2)
Total Protein: 7.2 g/dL (ref 6.5–8.1)

## 2020-08-18 LAB — CBC WITH DIFFERENTIAL/PLATELET
Abs Immature Granulocytes: 0.02 10*3/uL (ref 0.00–0.07)
Basophils Absolute: 0 10*3/uL (ref 0.0–0.1)
Basophils Relative: 0 %
Eosinophils Absolute: 0.1 10*3/uL (ref 0.0–0.5)
Eosinophils Relative: 2 %
HCT: 45.5 % (ref 36.0–46.0)
Hemoglobin: 15.2 g/dL — ABNORMAL HIGH (ref 12.0–15.0)
Immature Granulocytes: 0 %
Lymphocytes Relative: 40 %
Lymphs Abs: 2.4 10*3/uL (ref 0.7–4.0)
MCH: 30.3 pg (ref 26.0–34.0)
MCHC: 33.4 g/dL (ref 30.0–36.0)
MCV: 90.6 fL (ref 80.0–100.0)
Monocytes Absolute: 0.4 10*3/uL (ref 0.1–1.0)
Monocytes Relative: 7 %
Neutro Abs: 2.9 10*3/uL (ref 1.7–7.7)
Neutrophils Relative %: 51 %
Platelets: 283 10*3/uL (ref 150–400)
RBC: 5.02 MIL/uL (ref 3.87–5.11)
RDW: 13.2 % (ref 11.5–15.5)
WBC: 5.9 10*3/uL (ref 4.0–10.5)
nRBC: 0 % (ref 0.0–0.2)

## 2020-08-18 MED ORDER — OXYCODONE-ACETAMINOPHEN 5-325 MG PO TABS: 1.0000 | ORAL_TABLET | Freq: Four times a day (QID) | ORAL | 0 refills | Status: DC | PRN

## 2020-08-18 MED ORDER — OXYCODONE-ACETAMINOPHEN 5-325 MG PO TABS
1.0000 | ORAL_TABLET | Freq: Once | ORAL | Status: AC
  Administered 2020-08-18: 1 via ORAL
  Filled 2020-08-18: qty 1

## 2020-08-18 MED ORDER — FENTANYL CITRATE (PF) 100 MCG/2ML IJ SOLN
50.0000 ug | Freq: Once | INTRAMUSCULAR | Status: AC
  Administered 2020-08-18: 50 ug via INTRAVENOUS
  Filled 2020-08-18: qty 2

## 2020-08-18 MED ORDER — IOHEXOL 300 MG/ML  SOLN
100.0000 mL | Freq: Once | INTRAMUSCULAR | Status: AC | PRN
  Administered 2020-08-18: 100 mL via INTRAVENOUS

## 2020-08-18 NOTE — Discharge Instructions (Addendum)
Take the oxycodone every 6 hours as needed for severe pain.  Be aware this medication can make you drowsy. Do not operate motor vehicles or drink alcohol while taking this medication.  There is also 325 mg of Tylenol per tab. Continue taking 400 mg of ibuprofen every 6 hours. You can apply a Salonpas with lidocaine patch to your area of pain for additional pain relief.  You can buy this off the shelf any pharmacy, including [facility]. Use the breathing apparatus (incentive spirometer) every few hours while awake to maintain healthy lungs while you heal. Apply ice to your areas of pain for 20 minutes at a time. Follow closely with your primary care provider.
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
## 2020-08-18 NOTE — ED Notes (Signed)
ED Provider at bedside. 

## 2020-08-18 NOTE — ED Notes (Signed)
Patient was provided an incentive spirometer and given instructions for use-Monique,RN

## 2020-08-18 NOTE — ED Notes (Signed)
Patient transported to CT 
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
## 2020-09-20 ENCOUNTER — Other Ambulatory Visit: Payer: Self-pay | Admitting: Internal Medicine

## 2020-09-20 DIAGNOSIS — Z1231 Encounter for screening mammogram for malignant neoplasm of breast: Secondary | ICD-10-CM
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
## 2020-10-03 ENCOUNTER — Ambulatory Visit: Payer: 59 | Admitting: Gynecologic Oncology

## 2020-11-01 ENCOUNTER — Ambulatory Visit: Payer: 59

## 2020-11-22 ENCOUNTER — Ambulatory Visit: Payer: 59 | Admitting: Gynecologic Oncology

## 2020-11-30 ENCOUNTER — Other Ambulatory Visit: Payer: Self-pay

## 2020-11-30 ENCOUNTER — Ambulatory Visit
Admission: RE | Admit: 2020-11-30 | Discharge: 2020-11-30 | Disposition: A | Discharge status: Home / Self Care | Payer: 59 | Source: Ambulatory Visit | Attending: Internal Medicine | Admitting: Internal Medicine

## 2020-11-30 DIAGNOSIS — Z1231 Encounter for screening mammogram for malignant neoplasm of breast: Secondary | ICD-10-CM

## 2021-03-27 ENCOUNTER — Encounter: Payer: Self-pay | Admitting: Gynecologic Oncology
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
## 2021-03-28 ENCOUNTER — Other Ambulatory Visit: Payer: Self-pay

## 2021-03-28 ENCOUNTER — Other Ambulatory Visit (HOSPITAL_COMMUNITY)
Admission: RE | Admit: 2021-03-28 | Discharge: 2021-03-28 | Disposition: A | Discharge status: Home / Self Care | Payer: 59 | Source: Ambulatory Visit | Attending: Gynecologic Oncology | Admitting: Gynecologic Oncology

## 2021-03-28 ENCOUNTER — Inpatient Hospital Stay: Payer: 59 | Attending: Gynecologic Oncology | Admitting: Gynecologic Oncology

## 2021-03-28 ENCOUNTER — Encounter: Payer: Self-pay | Admitting: Gynecologic Oncology

## 2021-03-28 VITALS — BP 131/75 | HR 67 | Temp 97.5°F | Resp 18 | Ht 67.0 in | Wt 263.3 lb

## 2021-03-28 DIAGNOSIS — Z79899 Other long term (current) drug therapy: Secondary | ICD-10-CM | POA: Insufficient documentation

## 2021-03-28 DIAGNOSIS — Z7982 Long term (current) use of aspirin: Secondary | ICD-10-CM | POA: Insufficient documentation

## 2021-03-28 DIAGNOSIS — C538 Malignant neoplasm of overlapping sites of cervix uteri: Secondary | ICD-10-CM | POA: Insufficient documentation

## 2021-03-28 DIAGNOSIS — I5032 Chronic diastolic (congestive) heart failure: Secondary | ICD-10-CM | POA: Diagnosis not present

## 2021-03-28 DIAGNOSIS — I110 Hypertensive heart disease with heart failure: Secondary | ICD-10-CM | POA: Diagnosis not present

## 2021-03-28 DIAGNOSIS — Z87891 Personal history of nicotine dependence: Secondary | ICD-10-CM | POA: Diagnosis not present

## 2021-03-28 DIAGNOSIS — Z8541 Personal history of malignant neoplasm of cervix uteri: Secondary | ICD-10-CM | POA: Insufficient documentation

## 2021-03-28 DIAGNOSIS — K219 Gastro-esophageal reflux disease without esophagitis: Secondary | ICD-10-CM | POA: Diagnosis not present

## 2021-03-28 DIAGNOSIS — Z791 Long term (current) use of non-steroidal anti-inflammatories (NSAID): Secondary | ICD-10-CM | POA: Insufficient documentation

## 2021-03-28 NOTE — Patient Instructions (Addendum)
It is great to see you!  I do not see or feel any evidence of cancer on my exam today.  I will have the office call you with the results of your Pap test, likely next week.    As we discussed, since you are about 2 and half years out from your surgery, we will transition to visits every year.  As always, if anything changes before your next scheduled visit with me, please call to see me sooner.  Please call in November or December to get scheduled to see me next summer in June.  My schedule is not out past the end of this year.

## 2021-04-02 ENCOUNTER — Ambulatory Visit (INDEPENDENT_AMBULATORY_CARE_PROVIDER_SITE_OTHER): Payer: Worker's Compensation

## 2021-04-02 ENCOUNTER — Encounter (HOSPITAL_COMMUNITY): Payer: Self-pay

## 2021-04-02 ENCOUNTER — Ambulatory Visit (HOSPITAL_COMMUNITY)
Admission: EM | Admit: 2021-04-02 | Discharge: 2021-04-02 | Disposition: A | Discharge status: Home / Self Care | Payer: Worker's Compensation | Attending: Physician Assistant | Admitting: Physician Assistant

## 2021-04-02 DIAGNOSIS — M25552 Pain in left hip: Secondary | ICD-10-CM | POA: Diagnosis not present

## 2021-04-02 DIAGNOSIS — R2689 Other abnormalities of gait and mobility: Secondary | ICD-10-CM | POA: Diagnosis not present

## 2021-04-02 DIAGNOSIS — T148XXA Other injury of unspecified body region, initial encounter: Secondary | ICD-10-CM

## 2021-04-02 MED ORDER — NAPROXEN 375 MG PO TABS: 375.0000 mg | ORAL_TABLET | Freq: Two times a day (BID) | ORAL | 0 refills | Status: DC

## 2021-04-02 NOTE — ED Triage Notes (Signed)
Pt c/o sharp pain on left side of hips. Pt states she was at work when the pain started.

## 2021-04-02 NOTE — ED Provider Notes (Signed)
[place]    CSN: [id number] Arrival date & time: 04/02/21  1511      History   Chief Complaint Chief Complaint  Patient presents with  . Leg Pain    Left     HPI Kayla Carey is a 50 y.o. female.   Patient presents today with a 2-day history of left leg pain.  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  She reports pain gradually worsened and she had to leave work early as result of symptoms.  She has since tried Tylenol as well as a topical analgesic with minimal improvement of symptoms.  Reports pain is rated 7 on a 0-10 pain scale, localized to left posterior upper leg without radiation, described as aching/sharp, worse with attempted ambulation or palpation, no alleviating factors identified.  Patient denies previous injury to left hip.  States she is unable to bear weight initially when changing position due to severity of pain but after few minutes is able to ambulate without assistive device.  She denies any previous surgery or injury.  She denies any weakness, numbness, paresthesias.   Past Medical History:  Diagnosis Date  . Abnormal electrocardiogram (ECG) (EKG) 08/11/2018  . Abnormal myocardial perfusion study 08/11/2018  . Cervical cancer [facility])    gyn oncologist-  dr Gerarda Fraction--  bx 08-25-2018  ,  invasive SCC, Stage IA1  . Chest pain 08/01/2018  . Chronic diastolic (congestive) heart failure [facility])    cardiologist-  dr Bettina Gavia  . GERD (gastroesophageal reflux disease)    per pt just drinks water  . Hypertension   . Hypertensive heart disease 08/01/2018  . IDA (iron deficiency anemia)   . Malignant neoplasm of cervix [facility])     Patient Active Problem List   Diagnosis Date Noted  . Hypertension   . Chronic diastolic (congestive) heart failure ([place])   . Cervical cancer ([place]) 10/14/2018  . Malignant neoplasm of cervix ([place])   . Abnormal  electrocardiogram (ECG) (EKG) 08/11/2018  . Abnormal myocardial perfusion study 08/11/2018  . Hypertensive heart disease 08/01/2018  . GERD (gastroesophageal reflux disease) 08/01/2018  . Chest pain 08/01/2018  . IDA (iron deficiency anemia) 08/01/2018    Past Surgical History:  Procedure Laterality Date  . BREAST BIOPSY Left    over [place] in [place]- benign  . BREAST SURGERY Left age 64   Lumpectomy-benign  . CERVICAL CONIZATION W/BX N/A 08/25/2018   Procedure: CONIZATION CERVIX WITH BIOPSY AND ENDOCERVICAL CURETTAGE;  Surgeon: Isabel Caprice, MD;  Location: [facility];  Service: Gynecology;  Laterality: N/A;  . LEFT HEART CATH AND CORONARY ANGIOGRAPHY N/A 08/13/2018   Procedure: LEFT HEART CATH AND CORONARY ANGIOGRAPHY;  Surgeon: Troy Sine, MD;  Location: [place] CV LAB;  Service: Cardiovascular;  Laterality: N/A;    OB History     Gravida  6   Para  3   Term  3   Preterm      AB  3   Living  2      SAB      IAB  3   Ectopic      Multiple      Live Births               Home Medications    Prior to Admission medications   Medication Sig Start Date End  Date Taking? Authorizing Provider  albuterol (PROVENTIL HFA;VENTOLIN HFA) 108 (90 Base) MCG/ACT inhaler Inhale 1 puff into the lungs every 6 (six) hours as needed for wheezing or shortness of breath. Patient not taking: No sig reported 11/01/18   Azzie Glatter, FNP  amLODipine (NORVASC) 10 MG tablet Take 1 tablet by mouth daily. 11/17/20   [provider]  Ascorbic Acid (VITAMIN C) 1000 MG tablet Take 1,000 mg by mouth daily.     [provider]  aspirin EC 81 MG tablet Take 81 mg by mouth daily.    [provider]  Cholecalciferol (VITAMIN D3) 2000 units capsule Take 2,000 Units by mouth daily.    [provider]  ferrous sulfate 325 (65 FE) MG tablet Take 325 mg by mouth daily.    [provider]  furosemide (LASIX)  40 MG tablet TAKE 1 TABLET BY MOUTH TWICE DAILY AND AN EXTRA TABLET ON MONDAY, [facility] AND FRIDAY 07/22/20   Richardo Priest, MD  hydrALAZINE (APRESOLINE) 25 MG tablet Take 1 tablet (25 mg total) by mouth 4 (four) times daily. Patient not taking: Reported on 03/27/2021 12/22/19   Richardo Priest, MD  KLOR-CON M20 20 MEQ tablet Take 20 mEq by mouth daily. 11/17/20   [provider]  losartan-hydrochlorothiazide (HYZAAR) 100-12.5 MG tablet TAKE 1 TABLET BY MOUTH EVERY MORNING 06/24/20   Richardo Priest, MD  metoprolol tartrate (LOPRESSOR) 50 MG tablet Take 50 mg by mouth 2 (two) times daily. 11/17/20   [provider]  naproxen (NAPROSYN) 375 MG tablet Take 1 tablet (375 mg total) by mouth 2 (two) times daily with a meal. 04/02/21   Daveena Elmore K, PA-C  oxyCODONE-acetaminophen (PERCOCET/ROXICET) 5-325 MG tablet Take 1-2 tablets by mouth every 6 (six) hours as needed for severe pain. Patient not taking: Reported on 03/27/2021 08/18/20   Robinson, [place] N, PA-C  valsartan (DIOVAN) 320 MG tablet Take 320 mg by mouth daily. 11/15/20   [provider]    Family History Family History  Problem Relation Age of Onset  . Hypertension Mother   . Congestive Heart Failure Mother   . Hypertension Father   . Diabetes Father   . Cancer Father        Prostate  . Kidney cancer Son        son died from disease    Social History Social History   Tobacco Use  . Smoking status: Former    Packs/day: 0.50    Years: 20.00    Pack years: 10.00    Types: Cigarettes    Quit date: 07/15/2018    Years since quitting: 2.7  . Smokeless tobacco: Never  Vaping Use  . Vaping Use: Never used  Substance Use Topics  . Alcohol use: Not Currently  . Drug use: Never     Allergies   Patient has no known allergies.   Review of Systems Review of Systems  Constitutional:  Positive for activity change. Negative for appetite change, fatigue and fever.  Respiratory:  Negative for cough and  shortness of breath.   Cardiovascular:  Negative for chest pain.  Gastrointestinal:  Negative for abdominal pain, diarrhea, nausea and vomiting.  Musculoskeletal:  Positive for arthralgias, gait problem and myalgias. Negative for joint swelling.  Neurological:  Negative for dizziness, weakness, light-headedness and headaches.    Physical Exam Triage Vital Signs ED Triage Vitals [04/02/21 1648]  Enc Vitals Group     BP (!) 143/92     Pulse  Rate 60     Resp 17     Temp 98.6 F (37 C)     Temp Source Oral     SpO2 96 %     Weight      Height      Head Circumference      Peak Flow      Pain Score 7     Pain Loc      Pain Edu?      Excl. in [place]?    No data found.  Updated Vital Signs BP (!) 143/92 (BP Location: Right Arm)   Pulse 60   Temp 98.6 F (37 C) (Oral)   Resp 17   LMP  (LMP Unknown)   SpO2 96%   Visual Acuity Right Eye Distance:   Left Eye Distance:   Bilateral Distance:    Right Eye Near:   Left Eye Near:    Bilateral Near:     Physical Exam Vitals reviewed.  Constitutional:      General: She is awake. She is not in acute distress.    Appearance: Normal appearance. She is normal weight. She is not ill-appearing.     Comments: Very pleasant female appears stated age in no acute distress  HENT:     Head: Normocephalic and atraumatic.  Cardiovascular:     Rate and Rhythm: Normal rate and regular rhythm.     Heart sounds: Normal heart sounds, S1 normal and S2 normal. No murmur heard. Pulmonary:     Effort: Pulmonary effort is normal.     Breath sounds: Normal breath sounds. No wheezing, rhonchi or rales.     Comments: Clear to auscultation bilaterally Musculoskeletal:     Left hip: No deformity, tenderness or bony tenderness. Normal range of motion.     Left upper leg: Tenderness present. No swelling, deformity or bony tenderness.       Legs:     Comments: Left hip: Tenderness to palpation along hamstrings.  No deformity noted.  Normal active range  of motion at left hip; pain with extension and abduction.  Antalgic gait.  Psychiatric:        Behavior: Behavior is cooperative.     UC Treatments / Results  Labs (all labs ordered are listed, but only abnormal results are displayed) Labs Reviewed - No data to display  EKG   Radiology DG Hip Unilat With Pelvis 2-3 Views Left  Result Date: 04/02/2021 CLINICAL DATA:  Sharp left hip pain EXAM: DG HIP (WITH OR WITHOUT PELVIS) 2-3V LEFT COMPARISON:  None. FINDINGS: There is no evidence of hip fracture or dislocation. There is no evidence of arthropathy or other focal bone abnormality. IMPRESSION: Negative. Electronically Signed   By: Davina Poke D.O.   On: 04/02/2021 18:00    Procedures Procedures (including critical care time)  Medications Ordered in UC Medications - No data to display  Initial Impression / Assessment and Plan / UC Course  I have reviewed the triage vital signs and the nursing notes.  Pertinent labs & imaging results that were available during my care of the patient were reviewed by me and considered in my medical decision making (see chart for details).     X-ray obtained showed no acute abnormalities.  Concern for muscle strain given clinical presentation.  Patient was prescribed Naprosyn to be taken twice daily with instruction not to take additional NSAIDs with this medication due to risk of GI bleeding.  She can use Tylenol for breakthrough pain.  Recommended she used heat, rest, stretch for symptom relief.  Recommend she follow-up with primary care provider within 1 week.  Discussed alarm symptoms that warrant emergent evaluation.  Strict return precautions given to which patient expressed understanding.   Final Clinical Impressions(s) / UC Diagnoses   Final diagnoses:  Left hip pain  Limping  Muscle strain     Discharge Instructions      Your x-ray was normal.  I suspect that you have a muscle strain.  Take Naprosyn twice daily for pain relief.   Do not take additional NSAIDs including aspirin, ibuprofen/Advil, naproxen/Aleve with this medication as it can cause stomach bleeding.  Use heat, rest, stretch for symptom relief.  If anything worsens please return for reevaluation.     ED Prescriptions     Medication Sig Dispense Auth. Provider   naproxen (NAPROSYN) 375 MG tablet Take 1 tablet (375 mg total) by mouth 2 (two) times daily with a meal. 20 tablet Sophiamarie Nease K, PA-C      I have reviewed the PDMP during this encounter.   Terrilee Croak, PA-C 04/02/21 1833

## 2021-04-02 NOTE — Discharge Instructions (Signed)
Your x-ray was normal.  I suspect that you have a muscle strain.  Take Naprosyn twice daily for pain relief.  Do not take additional NSAIDs including aspirin, ibuprofen/Advil, naproxen/Aleve with this medication as it can cause stomach bleeding.  Use heat, rest, stretch for symptom relief.  If anything worsens please return for reevaluation.

## 2021-04-03 ENCOUNTER — Telehealth: Payer: Self-pay

## 2021-04-03 LAB — CYTOLOGY - PAP
Comment: NEGATIVE
Diagnosis: NEGATIVE
High risk HPV: NEGATIVE

## 2021-04-03 NOTE — Telephone Encounter (Signed)
Told Ms Kayla Carey that her Pap smear was normal and HPV high risk negative per Joylene John, NP. Pt verbalized understanding.

## 2021-05-13 ENCOUNTER — Other Ambulatory Visit: Payer: Self-pay

## 2021-05-13 ENCOUNTER — Ambulatory Visit: Payer: 59 | Attending: Internal Medicine

## 2021-05-13 DIAGNOSIS — M6281 Muscle weakness (generalized): Secondary | ICD-10-CM

## 2021-05-13 DIAGNOSIS — R262 Difficulty in walking, not elsewhere classified: Secondary | ICD-10-CM

## 2021-05-13 DIAGNOSIS — S76012D Strain of muscle, fascia and tendon of left hip, subsequent encounter: Secondary | ICD-10-CM | POA: Diagnosis present

## 2021-05-13 DIAGNOSIS — M25552 Pain in left hip: Secondary | ICD-10-CM | POA: Diagnosis not present

## 2021-05-13 NOTE — Therapy (Signed)
[place], [place], [address] Phone: [phone]   Fax:  [phone]  Physical Therapy Evaluation  Patient Details  Name: Kayla Carey MRN: [id number] Date of Birth: 1971-09-21 Referring Provider (PT): Benito Mccreedy, MD   Encounter Date: 05/13/2021   PT End of Session - 05/13/21 1202     Visit Number 1    Number of Visits 9    Date for PT Re-Evaluation 06/17/21    Authorization Type [employer]; Pt reports she is covered by workers comp    Progress Note Due on Visit 10    PT Start Time 1030    PT Stop Time 1130    PT Time Calculation (min) 60 min    Activity Tolerance Patient tolerated treatment well    Behavior During Therapy [facility] for tasks assessed/performed             Past Medical History:  Diagnosis Date   Abnormal electrocardiogram (ECG) (EKG) 08/11/2018   Abnormal myocardial perfusion study 08/11/2018   Cervical cancer [facility])    gyn oncologist-  dr Gerarda Fraction--  bx 08-25-2018  ,  invasive SCC, Stage IA1   Chest pain 08/01/2018   Chronic diastolic (congestive) heart failure [facility])    cardiologist-  dr Bettina Gavia   GERD (gastroesophageal reflux disease)    per pt just drinks water   Hypertension    Hypertensive heart disease 08/01/2018   IDA (iron deficiency anemia)    Malignant neoplasm of cervix ([place])     Past Surgical History:  Procedure Laterality Date   BREAST BIOPSY Left    over [place]- benign   BREAST SURGERY Left age 14   Lumpectomy-benign   CERVICAL CONIZATION W/BX N/A 08/25/2018   Procedure: CONIZATION CERVIX WITH BIOPSY AND ENDOCERVICAL CURETTAGE;  Surgeon: Isabel Caprice, MD;  Location: [place];  Service: Gynecology;  Laterality: N/A;   LEFT HEART CATH AND CORONARY ANGIOGRAPHY N/A 08/13/2018   Procedure: LEFT HEART CATH AND CORONARY ANGIOGRAPHY;  Surgeon: Troy Sine, MD;  Location: [place] CV LAB;  Service:  Cardiovascular;  Laterality: N/A;    There were no vitals filed for this visit.    Subjective Assessment - 05/13/21 1042     Subjective Pt reports over a month ago a sharp pain in the L deep ant hip when coming from squating to standing with providing pt. care. Pt works as a [employer]arry manager. Currently, she experiences pain with prolonged standing and walking, and with weight bearing of the L LE with initial steps after coming from sit to stand. Pt is continuing to work full time, sitting and rest as she is able to manage the pain. Pt's pain in intermittent and will resolve with rest. pt denied pain prior to today's assessment.    Pertinent History CHF, obesity    How long can you sit comfortably? Not and issue    How long can you stand comfortably? Intermittent pain    How long can you walk comfortably? 1 hour    Diagnostic tests 04/02/21: xray-FINDINGS:  There is no evidence of hip fracture or dislocation. There is no  evidence of arthropathy or other focal bone abnormality.     IMPRESSION:  Negative.FINDINGS:    Patient Stated Goals To have less pain and to figure out what is going on with her L hip    Currently in Pain? Yes    Pain Score 6    intermittent  Pain Location Hip    Pain Orientation Left    Pain Descriptors / Indicators Sharp;Aching    Pain Type Acute pain    Pain Radiating Towards NA    Pain Onset 1 to 4 weeks ago    Pain Frequency Intermittent    Aggravating Factors  Weight bearing with the L LE    Pain Relieving Factors Rest, sitting                [facility] PT Assessment - 05/13/21 0001       Assessment   Medical Diagnosis Strain of muscle, fascia and tendon of left hip, subsequent encounter    Referring Provider (PT) Benito Mccreedy, MD    Onset Date/Surgical Date --   over a month   Hand Dominance Right    Next MD Visit 3 months    Prior Therapy no      Precautions   Precautions None      Restrictions   Weight Bearing Restrictions No      Balance Screen    Has the patient fallen in the past 6 months No      Home Environment   Living Environment Private residence    Living Arrangements Alone    Type of Home Apartment    Home Access Stairs to enter    Entrance Stairs-Number of Steps 1    Entrance Stairs-Rails None    Home Layout Two level    Alternate Level Stairs-Number of Steps 13    Alternate Level Stairs-Rails Left      Prior Function   Level of Independence Independent    Vocation Full time employment    Vocation Requirements [employer]- [employer]      Cognition   Overall Cognitive Status Within Functional Limits for tasks assessed      Observation/Other Assessments   Focus on Therapeutic Outcomes (FOTO)  65% functional mobility      Sensation   Light Touch Appears Intact      Posture/Postural Control   Posture/Postural Control Postural limitations    Postural Limitations Forward head;Increased lumbar lordosis      ROM / Strength   AROM / PROM / Strength AROM;Strength      AROM   Overall AROM Comments L hip PROM found equal to the L      Strength   Overall Strength Comments Grossly the l hip strength was found 4+/5 to 5/5 on the R      Palpation   Palpation comment Not TTP to the lateral GT area      Special Tests    Special Tests Hip Special Tests    Hip Special Tests  Saralyn Pilar (FABER) Test;Anterior Hip Impingement Test;Piriformis Test;Other      Saralyn Pilar (FABER) Test   Findings Positive    Side Left    Comments min increase in L ant hip pain      Anterior Hip Impingement Test    Findings Positive    Side  Left    Comments Significnat L ant hip pain      Piriformis Test   Findings Negative    Side  Left      other   Findings Negative    Side Left    Comments FADER      Transfers   Transfers Sit to Stand;Stand to Sit    Sit to Stand 7: Independent      Ambulation/Gait   Ambulation/Gait Yes    Gait Pattern Step-through pattern;Within Functional Limits  Objective  measurements completed on examination: See above findings.       [facility] Adult PT Treatment/Exercise - 05/13/21 0001       Exercises   Exercises Knee/Hip      Knee/Hip Exercises: Seated   Sit to Sand 10 reps;without UE support                    PT Education - 05/13/21 1202     Education Details Eval findings, POC, HEP    Person(s) Educated Patient    Methods Explanation;Demonstration;Tactile cues;Verbal cues;Handout    Comprehension Verbalized understanding;Returned demonstration;Verbal cues required;Tactile cues required              PT Short Term Goals - 05/13/21 2102       PT SHORT TERM GOAL #1   Title Pt will be Ind in an initial HEP    Status New    Target Date 06/03/21      PT SHORT TERM GOAL #2   Title Pt will voice understanding of measures to assist with the reduction of L hip pain               PT Long Term Goals - 05/13/21 2112       PT LONG TERM GOAL #1   Title Improve L hip strength to 5/5 to improve L hip function    Status New    Target Date 06/17/21      PT LONG TERM GOAL #2   Title Pt will report s decrease in ant. L hip pain to 3/10 or less with daily and work related activities    Status New    Target Date 06/17/21      PT LONG TERM GOAL #3   Title Pt's FOTO score will increase to the predicted value of 72%    Baseline 65%    Status New    Target Date 06/17/21      PT LONG TERM GOAL #4   Title Pt will be Ind in a final HEP to maintain or progress achieved level of funcion.    Status New    Target Date 06/17/21                    Plan - 05/13/21 1143     Clinical Impression Statement Pt presents to PT with L ant. hip pain which has been present since an injury at work on 04/01/21. Pt's pain was most signiificantly provoked c the ant. hip impingement test. and she reports an increase in pain with prolonged standing and walking and with initial steps after standing. 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  Xrays were negative for dislocation, fx, or boney abnormalities. Ant. hip impingement test suggests possible labral issue. The L hip was general found to be weak. Pt was started on sit t/f standing for strengthening. Pt is able to manage the pain with rest. Pt sleeps in a recliner, but this is related to breathing issues when lying supine. Pt may benefit fom PT 2w4 to address pain and deficits to optimize pt's function and QOL.    Personal Factors and Comorbidities Profession    Examination-Activity Limitations Locomotion Level;Stairs    Examination-Participation Restrictions Occupation    Stability/Clinical Decision Making Stable/Uncomplicated    Clinical Decision Making Low    Rehab Potential Good    PT Frequency 2x / week    PT Duration 4 weeks    PT Treatment/Interventions ADLs/Self Care  Home Management;Cryotherapy;Electrical Stimulation;Iontophoresis '4mg'$ /ml Dexamethasone;Moist Heat;Therapeutic exercise;Therapeutic activities;Stair training;Gait training;Functional mobility training;Patient/family education;Manual techniques;Dry needling;Taping;Vasopneumatic Device;Joint Manipulations    PT Next Visit Plan Assess response to HEP. Progress therex and use manual techniques and modalities as indicated.    PT Home Exercise Plan [id number]    Consulted and Agree with Plan of Care Patient             Patient will benefit from skilled therapeutic intervention in order to improve the following deficits and impairments:  Difficulty walking, Obesity, Pain, Decreased strength  Visit Diagnosis: Pain in left hip  Strain of muscle, fascia and tendon of left hip, subsequent encounter  Muscle weakness (generalized)  Difficulty in walking, not elsewhere classified     Problem List Patient Active Problem List   Diagnosis Date Noted   Hypertension    Chronic diastolic (congestive) heart failure ([place])    Cervical cancer ([place]) 10/14/2018   Malignant neoplasm of cervix ([facility])    Abnormal  electrocardiogram (ECG) (EKG) 08/11/2018   Abnormal myocardial perfusion study 08/11/2018   Hypertensive heart disease 08/01/2018   GERD (gastroesophageal reflux disease) 08/01/2018   Chest pain 08/01/2018   IDA (iron deficiency anemia) 08/01/2018    Gar Ponto MS, PT 05/13/21 9:21 PM   [facility] [address] [place], [place], [address] Phone: [phone]   Fax:  [phone]  Name: Kayla Carey MRN: [id number] Date of Birth: 1971/04/25

## 2021-05-16 ENCOUNTER — Encounter: Payer: Self-pay | Admitting: Physical Therapy

## 2021-05-16 ENCOUNTER — Other Ambulatory Visit: Payer: Self-pay

## 2021-05-16 ENCOUNTER — Ambulatory Visit: Payer: 59 | Attending: Internal Medicine | Admitting: Physical Therapy

## 2021-05-16 DIAGNOSIS — M6281 Muscle weakness (generalized): Secondary | ICD-10-CM | POA: Insufficient documentation

## 2021-05-16 DIAGNOSIS — S76012D Strain of muscle, fascia and tendon of left hip, subsequent encounter: Secondary | ICD-10-CM | POA: Diagnosis present

## 2021-05-16 DIAGNOSIS — M25552 Pain in left hip: Secondary | ICD-10-CM

## 2021-05-16 DIAGNOSIS — R262 Difficulty in walking, not elsewhere classified: Secondary | ICD-10-CM

## 2021-05-16 NOTE — Therapy (Signed)
[place], [place], [address] Phone: [phone]   Fax:  [phone]  Physical Therapy Treatment  Patient Details  Name: Kayla Carey MRN: [id number] Date of Birth: July 17, 1971 Referring Provider (PT): Benito Mccreedy, MD   Encounter Date: 05/16/2021   PT End of Session - 05/16/21 1103     Visit Number 2    Number of Visits 9    Date for PT Re-Evaluation 06/17/21    Authorization Type [employer]; Pt reports she is covered by workers comp    Progress Note Due on Visit 10    PT Start Time 1027   15 minutes late   PT Stop Time 1100    PT Time Calculation (min) 33 min             Past Medical History:  Diagnosis Date   Abnormal electrocardiogram (ECG) (EKG) 08/11/2018   Abnormal myocardial perfusion study 08/11/2018   Cervical cancer [facility])    gyn oncologist-  dr Gerarda Fraction--  bx 08-25-2018  ,  invasive SCC, Stage IA1   Chest pain 08/01/2018   Chronic diastolic (congestive) heart failure [facility])    cardiologist-  dr Bettina Gavia   GERD (gastroesophageal reflux disease)    per pt just drinks water   Hypertension    Hypertensive heart disease 08/01/2018   IDA (iron deficiency anemia)    Malignant neoplasm of cervix ([place])     Past Surgical History:  Procedure Laterality Date   BREAST BIOPSY Left    over [place]- benign   BREAST SURGERY Left age 50   Lumpectomy-benign   CERVICAL CONIZATION W/BX N/A 08/25/2018   Procedure: CONIZATION CERVIX WITH BIOPSY AND ENDOCERVICAL CURETTAGE;  Surgeon: Isabel Caprice, MD;  Location: [place];  Service: Gynecology;  Laterality: N/A;   LEFT HEART CATH AND CORONARY ANGIOGRAPHY N/A 08/13/2018   Procedure: LEFT HEART CATH AND CORONARY ANGIOGRAPHY;  Surgeon: Troy Sine, MD;  Location: [place] [facility];  Service: Cardiovascular;  Laterality: N/A;    There were no vitals filed for this  visit.                      [place] Adult PT Treatment/Exercise - 05/16/21 0001       Knee/Hip Exercises: Stretches   Hip Flexor Stretch Limitations standing verses supine   better stretch in supine   Other Knee/Hip Stretches Figure 4 gentle push and pull    Other Knee/Hip Stretches lower trunk rotationsx 5 each      Knee/Hip Exercises: Seated   Sit to Sand 10 reps;without UE support      Knee/Hip Exercises: Supine   Hip Adduction Isometric 10 reps    Hip Adduction Isometric Limitations with abdominal/pelvic floor activation    Bridges 10 reps    Bridges Limitations with initial PPT    Other Supine Knee/Hip Exercises green band clam with cues for abdominal draw in                    PT Education - 05/16/21 1107     Education Details HEP    Person(s) Educated Patient    Methods Explanation;Handout    Comprehension Verbalized understanding              PT Short Term Goals - 05/13/21 2102       PT SHORT TERM GOAL #1   Title Pt will be Ind in an initial  HEP    Status New    Target Date 06/03/21      PT SHORT TERM GOAL #2   Title Pt will voice understanding of measures to assist with the reduction of L hip pain               PT Long Term Goals - 05/13/21 2112       PT LONG TERM GOAL #1   Title Improve L hip strength to 5/5 to improve L hip function    Status New    Target Date 06/17/21      PT LONG TERM GOAL #2   Title Pt will report s decrease in ant. L hip pain to 3/10 or less with daily and work related activities    Status New    Target Date 06/17/21      PT LONG TERM GOAL #3   Title Pt's FOTO score will increase to the predicted value of 72%    Baseline 65%    Status New    Target Date 06/17/21      PT LONG TERM GOAL #4   Title Pt will be Ind in a final HEP to maintain or progress achieved level of funcion.    Status New    Target Date 06/17/21                   Plan - 05/16/21 1104     Clinical  Impression Statement Pt arrives for her first treatment and reports compliance with her HEP exercise. She reports feeling about the same. Her pain is intermittently in lower abdominals/anterior hips. She notes pain with getting out of car today. Reviewed HEP and progressed with pelvic/hip mobility and musle activation. Progressed her HEP and at end of session she reported feeing like her body is about to change due to the exercises she has started.    PT Next Visit Plan Assess response to HEP. Progress therex and use manual techniques and modalities as indicated.; needs to be scheduled each week    PT [place]             Patient will benefit from skilled therapeutic intervention in order to improve the following deficits and impairments:     Visit Diagnosis: Pain in left hip  Strain of muscle, fascia and tendon of left hip, subsequent encounter  Muscle weakness (generalized)  Difficulty in walking, not elsewhere classified     Problem List Patient Active Problem List   Diagnosis Date Noted   Hypertension    Chronic diastolic (congestive) heart failure ([place])    Cervical cancer ([place]) 10/14/2018   Malignant neoplasm of cervix ([facility])    Abnormal electrocardiogram (ECG) (EKG) 08/11/2018   Abnormal myocardial perfusion study 08/11/2018   Hypertensive heart disease 08/01/2018   GERD (gastroesophageal reflux disease) 08/01/2018   Chest pain 08/01/2018   IDA (iron deficiency anemia) 08/01/2018    Dorene Ar, PTA 05/16/2021, 11:08 AM  [facility] [address] [place], [place], [address] Phone: [phone]   Fax:  [phone]  Name: Nalea Guinane MRN: [id number] Date of Birth: [birth year]

## 2021-05-16 NOTE — Patient Instructions (Signed)
Access Code: [email or web address] URL: [email or web address] Date: 05/16/2021 Prepared by: Hessie Diener  Exercises Sit to Stand Without Arm Support - 3 x daily - 7 x weekly - 1 sets - 10 reps Supine Bridge with Gluteal Set and Spinal Articulation - 1 x daily - 7 x weekly - 2 sets - 10 reps Supine Lower Trunk Rotation - 1 x daily - 7 x weekly - 1 sets - 10 reps Modified Thomas Stretch - 1 x daily - 7 x weekly - 1 sets - 3 reps - 30 hold

## 2021-05-30 ENCOUNTER — Encounter: Payer: Self-pay | Admitting: Physical Therapy

## 2021-05-30 ENCOUNTER — Ambulatory Visit: Payer: 59 | Admitting: Physical Therapy

## 2021-05-30 ENCOUNTER — Other Ambulatory Visit: Payer: Self-pay

## 2021-05-30 DIAGNOSIS — M25552 Pain in left hip: Secondary | ICD-10-CM | POA: Diagnosis not present

## 2021-05-30 DIAGNOSIS — M6281 Muscle weakness (generalized): Secondary | ICD-10-CM

## 2021-05-30 DIAGNOSIS — S76012D Strain of muscle, fascia and tendon of left hip, subsequent encounter: Secondary | ICD-10-CM

## 2021-05-30 NOTE — Patient Instructions (Signed)
Access Code: [email or web address] URL: [email or web address] Date: 05/30/2021 Prepared by: Hessie Diener  Exercises Sit to Stand Without Arm Support - 3 x daily - 7 x weekly - 1 sets - 10 reps Supine Bridge with Gluteal Set and Spinal Articulation - 1 x daily - 7 x weekly - 2 sets - 10 reps Supine Lower Trunk Rotation - 1 x daily - 7 x weekly - 1 sets - 10 reps Modified Thomas Stretch - 1 x daily - 7 x weekly - 1 sets - 3 reps - 30 hold Clamshell with Resistance - 1 x daily - 7 x weekly - 2 sets - 10 reps

## 2021-05-30 NOTE — Therapy (Signed)
[place], [place], [address] Phone: [phone]   Fax:  [phone]  Physical Therapy Treatment  Patient Details  Name: Kayla Carey MRN: [id number] Date of Birth: 1971/08/31 Referring Provider (PT): Benito Mccreedy, MD   Encounter Date: 05/30/2021   PT End of Session - 05/30/21 0940     Visit Number 3    Number of Visits 9    Date for PT Re-Evaluation 06/17/21    Authorization Type [employer]; Pt reports she is covered by workers comp    Progress Note Due on Visit 10    PT Start Time 0930    PT Stop Time 1010    PT Time Calculation (min) 40 min             Past Medical History:  Diagnosis Date   Abnormal electrocardiogram (ECG) (EKG) 08/11/2018   Abnormal myocardial perfusion study 08/11/2018   Cervical cancer [facility])    gyn oncologist-  dr Gerarda Fraction--  bx 08-25-2018  ,  invasive SCC, Stage IA1   Chest pain 08/01/2018   Chronic diastolic (congestive) heart failure [facility])    cardiologist-  dr Bettina Gavia   GERD (gastroesophageal reflux disease)    per pt just drinks water   Hypertension    Hypertensive heart disease 08/01/2018   IDA (iron deficiency anemia)    Malignant neoplasm of cervix ([place])     Past Surgical History:  Procedure Laterality Date   BREAST BIOPSY Left    over 30 years ago in [place]- benign   BREAST SURGERY Left age 50   Lumpectomy-benign   CERVICAL CONIZATION W/BX N/A 08/25/2018   Procedure: CONIZATION CERVIX WITH BIOPSY AND ENDOCERVICAL CURETTAGE;  Surgeon: Isabel Caprice, MD;  Location: [place];  Service: Gynecology;  Laterality: N/A;   LEFT HEART CATH AND CORONARY ANGIOGRAPHY N/A 08/13/2018   Procedure: LEFT HEART CATH AND CORONARY ANGIOGRAPHY;  Surgeon: Troy Sine, MD;  Location: [place] [facility];  Service: Cardiovascular;  Laterality: N/A;    There were no vitals filed for this visit.   Subjective Assessment - 05/30/21 0932      Subjective I have only felt the hip pain 2  since I was here last.    Currently in Pain? No/denies                               [facility] Adult PT Treatment/Exercise - 05/30/21 0001       Knee/Hip Exercises: Stretches   Active Hamstring Stretch 2 reps;30 seconds    Hip Flexor Stretch Limitations supine    Other Knee/Hip Stretches Figure 4 gentle push and pull   supine and seated   Other Knee/Hip Stretches lower trunk rotationsx 5 each      Knee/Hip Exercises: Seated   Sit to [employer] 10 reps;without UE support;2 sets      Knee/Hip Exercises: Supine   Bridges 10 reps    Bridges Limitations with initial PPT    Other Supine Knee/Hip Exercises pelvic tilit x 10    Other Supine Knee/Hip Exercises green band clam with cues for abdominal draw in x 20      Knee/Hip Exercises: Sidelying   Clams x 20 each                    PT Education - 05/30/21 1024     Education Details HEP    Person(s) Educated  Patient    Methods Explanation;Handout    Comprehension Verbalized understanding              PT Short Term Goals - 05/30/21 0940       PT SHORT TERM GOAL #1   Title Pt will be Ind in an initial HEP    Period Weeks    Status Achieved    Target Date 06/03/21      PT SHORT TERM GOAL #2   Title Pt will voice understanding of measures to assist with the reduction of L hip pain    Baseline Pt reports stretching routine reduces her hip pain.    Period Weeks    Status Achieved               PT Long Term Goals - 05/13/21 2112       PT LONG TERM GOAL #1   Title Improve L hip strength to 5/5 to improve L hip function    Status New    Target Date 06/17/21      PT LONG TERM GOAL #2   Title Pt will report s decrease in ant. L hip pain to 3/10 or less with daily and work related activities    Status New    Target Date 06/17/21      PT LONG TERM GOAL #3   Title Pt's FOTO score will increase to the predicted value of 72%    Baseline 65%     Status New    Target Date 06/17/21      PT LONG TERM GOAL #4   Title Pt will be Ind in a final HEP to maintain or progress achieved level of funcion.    Status New    Target Date 06/17/21                   Plan - 05/30/21 0943     Clinical Impression Statement Pt reports improvement in hip pain with only feeling pain 2 x in the last 2 weeks.  She reports compliance with HEP and can verbalize ways to manage her pain. STG#1, #2 met She was unable to attend last week due to childcare. Reviewed HEP and progressed with core and hip stabilization/ strengthening. She was given an updated HEP.    PT Treatment/Interventions ADLs/Self Care Home Management;Cryotherapy;Electrical Stimulation;Iontophoresis 53m/ml Dexamethasone;Moist Heat;Therapeutic exercise;Therapeutic activities;Stair training;Gait training;Functional mobility training;Patient/family education;Manual techniques;Dry needling;Taping;Vasopneumatic Device;Joint Manipulations    PT Next Visit Plan Assess response to HEP. Progress therex and use manual techniques and modalities as indicated.; needs to be scheduled each week    PT [place]            Patient will benefit from skilled therapeutic intervention in order to improve the following deficits and impairments:  Difficulty walking, Obesity, Pain, Decreased strength  Visit Diagnosis: Pain in left hip  Muscle weakness (generalized)  Strain of muscle, fascia and tendon of left hip, subsequent encounter     Problem List Patient Active Problem List   Diagnosis Date Noted   Hypertension    Chronic diastolic (congestive) heart failure ([place]    [place]ical [place] 10/14/2018   Malignant neoplasm of cervix ([facility])    Abnormal electrocardiogram (ECG) (EKG) 08/11/2018   Abnormal myocardial perfusion study 08/11/2018   Hypertensive heart disease 08/01/2018   GERD (gastroesophageal reflux disease) 08/01/2018   Chest pain 08/01/2018   IDA (iron  deficiency anemia) 08/01/2018    DDorene Ar PTA 05/30/2021, 10:25 AM  [place], [place], [address] Phone: [phone]   Fax:  [phone]  Name: Verania Salberg MRN: [id number] Date of Birth: [birth year]

## 2021-06-08 ENCOUNTER — Other Ambulatory Visit: Payer: Self-pay

## 2021-06-08 ENCOUNTER — Ambulatory Visit: Payer: 59

## 2021-06-08 DIAGNOSIS — R262 Difficulty in walking, not elsewhere classified: Secondary | ICD-10-CM

## 2021-06-08 DIAGNOSIS — M25552 Pain in left hip: Secondary | ICD-10-CM | POA: Diagnosis not present

## 2021-06-08 DIAGNOSIS — S76012D Strain of muscle, fascia and tendon of left hip, subsequent encounter: Secondary | ICD-10-CM

## 2021-06-08 DIAGNOSIS — M6281 Muscle weakness (generalized): Secondary | ICD-10-CM

## 2021-06-08 NOTE — Therapy (Signed)
[place], [place], [address] Phone: [phone]   Fax:  [phone]  Physical Therapy Treatment/Discharge  Patient Details  Name: Kayla Carey MRN: [id number] Date of Birth: [birth year] Referring Provider (PT): Benito Mccreedy, MD   Encounter Date: 06/08/2021   PT End of Session - 06/08/21 1100     Visit Number 4    Number of Visits 9    Date for PT Re-Evaluation 06/17/21    Authorization Type [employer]; Pt reports she is covered by workers comp    Progress Note Due on Visit 10    PT Start Time 1101    PT Stop Time 1145    PT Time Calculation (min) 44 min    Activity Tolerance Patient tolerated treatment well    Behavior During Therapy [facility] for tasks assessed/performed             Past Medical History:  Diagnosis Date   Abnormal electrocardiogram (ECG) (EKG) 08/11/2018   Abnormal myocardial perfusion study 08/11/2018   Cervical cancer [facility])    gyn oncologist-  dr Gerarda Fraction--  bx 08-25-2018  ,  invasive SCC, Stage IA1   Chest pain 08/01/2018   Chronic diastolic (congestive) heart failure [facility])    cardiologist-  dr Bettina Gavia   GERD (gastroesophageal reflux disease)    per pt just drinks water   Hypertension    Hypertensive heart disease 08/01/2018   IDA (iron deficiency anemia)    Malignant neoplasm of cervix ([place])     Past Surgical History:  Procedure Laterality Date   BREAST BIOPSY Left    over [place] years ago [place]- benign   BREAST SURGERY Left age 48   Lumpectomy-benign   CERVICAL CONIZATION W/BX N/A 08/25/2018   Procedure: CONIZATION CERVIX WITH BIOPSY AND ENDOCERVICAL CURETTAGE;  Surgeon: Isabel Caprice, MD;  Location: [place];  Service: Gynecology;  Laterality: N/A;   LEFT HEART CATH AND CORONARY ANGIOGRAPHY N/A 08/13/2018   Procedure: LEFT HEART CATH AND CORONARY ANGIOGRAPHY;  Surgeon: Troy Sine, MD;  Location: [place] [facility];  Service:  Cardiovascular;  Laterality: N/A;    There were no vitals filed for this visit.   Subjective Assessment - 06/08/21 1114     Subjective Pt reports her L hip is doing much better and she is pleased with her progress. Pt states she has occasional brief pain at 3/10, onc per 3 to4 days vs 3-4x daily when she first started.    Pertinent History CHF, obesity    Diagnostic tests 04/02/21: xray-FINDINGS:  There is no evidence of hip fracture or dislocation. There is no  evidence of arthropathy or other focal bone abnormality.     IMPRESSION:  Negative.FINDINGS:    Patient Stated Goals To have less pain and to figure out what is going on with her L hip    Currently in Pain? No/denies    Pain Score 6     Pain Location Hip    Pain Orientation Left    Pain Descriptors / Indicators Sharp;Aching    Pain Type Acute pain    Pain Onset 1 to 4 weeks ago    Pain Frequency Intermittent                               [facility] Adult PT Treatment/Exercise - 06/08/21 0001       Knee/Hip Exercises: Stretches   Active Hamstring  Stretch 30 seconds;1 rep    Hip Flexor Stretch 1 rep;30 seconds    Hip Flexor Stretch Limitations supine    Other Knee/Hip Stretches Figure 4 gentle push and pull   supine and seated   Other Knee/Hip Stretches lower trunk rotationsx 5 each      Knee/Hip Exercises: Seated   Sit to Sand 10 reps;without UE support;2 sets      Knee/Hip Exercises: Supine   Hip Adduction Isometric 10 reps    Hip Adduction Isometric Limitations with abdominal/pelvic floor activation    Bridges 10 reps    Bridges Limitations with initial PPT    Other Supine Knee/Hip Exercises pelvic tilit x 10    Other Supine Knee/Hip Exercises green band clam with cues for abdominal draw in x 20      Knee/Hip Exercises: Sidelying   Clams x 20 each                    PT Education - 06/08/21 1330     Education Details Final HEP    Person(s) Educated Patient    Methods  Explanation;Demonstration;Tactile cues;Verbal cues;Handout    Comprehension Verbalized understanding;Returned demonstration;Verbal cues required;Tactile cues required              PT Short Term Goals - 05/30/21 0940       PT SHORT TERM GOAL #1   Title Pt will be Ind in an initial HEP    Period Weeks    Status Achieved    Target Date 06/03/21      PT SHORT TERM GOAL #2   Title Pt will voice understanding of measures to assist with the reduction of L hip pain    Baseline Pt reports stretching routine reduces her hip pain.    Period Weeks    Status Achieved               PT Long Term Goals - 06/08/21 1110       PT LONG TERM GOAL #1   Title Improve L hip strength to 5/5 to improve L hip function    Status Achieved    Target Date 06/08/21      PT LONG TERM GOAL #2   Title Pt will report s decrease in ant. L hip pain to 3/10 or less with daily and work related activities    Status Achieved    Target Date 06/08/21      PT LONG TERM GOAL #3   Title Pt's FOTO score will increase to the predicted value of 72%. 06/08/21: 99%    Baseline 65%    Status Achieved    Target Date 06/08/21      PT LONG TERM GOAL #4   Title Pt will be Ind in a final HEP to maintain or progress achieved level of funcion.    Status Achieved    Target Date 06/08/21                   Plan - 06/08/21 1101     Clinical Impression Statement Pt is DCed from PT today making good progress re: her pain and function. All PT goals have been met (see below) and pt is Ind in a HEP to continue to maintain or progress her condition at home.    Personal Factors and Comorbidities Profession    Examination-Activity Limitations Locomotion Level;Stairs    Examination-Participation Restrictions Occupation    Stability/Clinical Decision Making Stable/Uncomplicated    Clinical Decision Making Low  Rehab Potential Good    PT Frequency 2x / week    PT Duration 4 weeks    PT Treatment/Interventions  ADLs/Self Care Home Management;Cryotherapy;Electrical Stimulation;Iontophoresis 73m/ml Dexamethasone;Moist Heat;Therapeutic exercise;Therapeutic activities;Stair training;Gait training;Functional mobility training;Patient/family education;Manual techniques;Dry needling;Taping;Vasopneumatic Device;Joint Manipulations    PT Next Visit Plan Assess response to HEP. Progress therex and use manual techniques and modalities as indicated.; needs to be scheduled each week    PT Home Exercise Plan [place] Agree with Plan of Care Patient             Patient will benefit from skilled therapeutic intervention in order to improve the following deficits and impairments:  Difficulty walking, Obesity, Pain, Decreased strength  Visit Diagnosis: Pain in left hip  Strain of muscle, fascia and tendon of left hip, subsequent encounter  Muscle weakness (generalized)  Difficulty in walking, not elsewhere classified     Problem List Patient Active Problem List   Diagnosis Date Noted   Hypertension    Chronic diastolic (congestive) heart failure ([place]    [place]ical cancer ([place] 10/14/2018   Malignant [place] of [place]ix ([place]    Abnormal electrocardiogram (ECG) (EKG) 08/11/2018   Abnormal myocardial perfusion study 08/11/2018   Hypertensive heart disease 08/01/2018   GERD (gastroesophageal reflux disease) 08/01/2018   Chest pain 08/01/2018   IDA (iron deficiency anemia) 08/01/2018  PHYSICAL THERAPY DISCHARGE SUMMARY  Visits from Start of Care: 4  Current functional level related to goals / functional outcomes: See above   Remaining deficits: See above   Education / Equipment: HEP   Patient agrees to discharge. Patient goals were met. Patient is being discharged due to being pleased with the current functional level.   AGar PontoMS, PT 06/08/21 3:27 PM   [facility]GCalabas[place] [address]Phone:  [phone]  Fax:  [phone] Name: CAzula ZappiaMRN: [id number] of Birth: 703-24-1972

## 2022-08-13 ENCOUNTER — Other Ambulatory Visit: Payer: Self-pay | Admitting: Family Medicine

## 2022-08-13 DIAGNOSIS — E269 Hyperaldosteronism, unspecified: Secondary | ICD-10-CM

## 2022-08-14 ENCOUNTER — Ambulatory Visit
Admission: RE | Admit: 2022-08-14 | Discharge: 2022-08-14 | Disposition: A | Discharge status: Home / Self Care | Payer: Commercial Managed Care - HMO | Source: Ambulatory Visit | Attending: Family Medicine | Admitting: Family Medicine

## 2022-08-14 DIAGNOSIS — E269 Hyperaldosteronism, unspecified: Secondary | ICD-10-CM

## 2022-08-15 ENCOUNTER — Ambulatory Visit: Payer: Commercial Managed Care - HMO | Attending: Cardiology | Admitting: Cardiology

## 2022-08-15 ENCOUNTER — Encounter: Payer: Self-pay | Admitting: Cardiology

## 2022-08-15 VITALS — BP 142/89 | HR 65 | Ht 67.0 in | Wt 273.0 lb

## 2022-08-15 DIAGNOSIS — I110 Hypertensive heart disease with heart failure: Secondary | ICD-10-CM

## 2022-08-15 DIAGNOSIS — I5032 Chronic diastolic (congestive) heart failure: Secondary | ICD-10-CM | POA: Diagnosis not present

## 2022-08-15 DIAGNOSIS — I517 Cardiomegaly: Secondary | ICD-10-CM

## 2022-08-15 DIAGNOSIS — I1A0 Resistant hypertension: Secondary | ICD-10-CM | POA: Diagnosis not present

## 2022-08-15 DIAGNOSIS — R42 Dizziness and giddiness: Secondary | ICD-10-CM

## 2022-08-15 NOTE — Progress Notes (Signed)
Cardiology Office Note:    Date:  08/15/2022   ID:  Kayla Carey, DOB 1971/04/20, MRN [id number]  PCP:  Benito Mccreedy, MD  Cardiologist:  Shirlee More, MD    Referring MD: Bobbye Riggs, FNP    ASSESSMENT:    1. Resistant hypertension   2. Hypertensive heart disease with chronic diastolic congestive heart failure ([place])   3. Left ventricular hypertrophy   4. Vertigo    PLAN:    In order of problems listed above:  With the addition of spironolactone hypertension is markedly improved currently at target she will continue her amlodipine losartan metoprolol as well as spironolactone.  I have asked him to continue to trend blood pressure for about 2 weeks send it through [facility] and if she consistently has systolics less than 956 to contact me to attenuate her other medications. Stable presently not on a loop diuretic Markedly abnormal EKG we should recheck her echocardiogram to exclude apical hypertrophic cardiomyopathy If ongoing may need ENT evaluation or physical therapy modalities   Next appointment: 6 months   Medication Adjustments/Labs and Tests Ordered: Current medicines are reviewed at length with the patient today.  Concerns regarding medicines are outlined above.  Orders Placed This Encounter  Procedures   Basic metabolic panel   Pro b natriuretic peptide (BNP)   EKG 12-Lead   ECHOCARDIOGRAM COMPLETE   No orders of the defined types were placed in this encounter.   Chief Complaint  Patient presents with   Follow-up   Hypertension    History of Present Illness:    Kayla Carey is a 51 y.o. female with a hx of hypertensive heart disease with heart failure anemia and previous hypotension following COVID infection last seen 07/11/2020.  She was recently seen primary care and referred to my office now for resistant hypertension.  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 Recently she has had mild hypokalemia potassium 3.4 She had an emergency room visit in [place] with dizziness and CT scan of the head was done I think she is having inner ear vertigo problems and has tinnitus. I checked blood pressure myself in the office and she had no orthostatic shift.  Blood pressure by me sitting 124/80 standing 134/90 No chest pain edema shortness of breath palpitation or syncope I reviewed the EKG from the emergency room that her sister had a copy of showing the same pattern of LVH and marked repolarization.  She had left heart catheterization 08/13/2018 with normal coronary arteriography hyperdynamic left ventricular function and elevated left ventricular diastolic pressure. LEFT HEART CATH AND CORONARY ANGIOGRAPHY   Conclusion LV end diastolic pressure is severely elevated.   Normal epicardial coronary arteries with a dominant RCA coronary circulation.   Significant elevation of LVEDP at 40 mm in this patient with hyperdynamic LV function and severe LVH with diastolic dysfunction.  Echocardiogram 08/05/2018 showed normal left ventricular size severe left ventricular hypertrophy hyperdynamic LV function moderate left and mild right atrial enlargement. Study Conclusions   - Left ventricle: The cavity size was normal. Wall thickness was    increased in a pattern of severe LVH. Systolic function was    vigorous. The estimated ejection fraction was in the range of 75%  to 80%. Wall motion was normal; there were no regional wall    motion abnormalities. Doppler parameters are consistent with    abnormal left ventricular relaxation (grade 1 diastolic    dysfunction).  - Left atrium: The atrium was moderately dilated.  - Right  atrium: The atrium was mildly dilated.   Past Medical History:  Diagnosis Date   Abnormal electrocardiogram (ECG) (EKG) 08/11/2018   Abnormal myocardial perfusion study 08/11/2018   Cervical cancer [facility])    gyn oncologist-  dr Gerarda Fraction--  bx 08-25-2018  ,  invasive SCC, Stage IA1   Chest pain 08/01/2018   Chronic diastolic (congestive) heart failure [facility])    cardiologist-  dr Bettina Gavia   GERD (gastroesophageal reflux disease)    per pt just drinks water   Hypertension    Hypertensive heart disease 08/01/2018   IDA (iron deficiency anemia)    Malignant neoplasm of cervix ([place])     Past Surgical History:  Procedure Laterality Date   BREAST BIOPSY Left    over 30 years ago in another country- benign   BREAST SURGERY Left age 76   Lumpectomy-benign   CERVICAL CONIZATION W/BX N/A 08/25/2018   Procedure: CONIZATION CERVIX WITH BIOPSY AND ENDOCERVICAL CURETTAGE;  Surgeon: Isabel Caprice, MD;  Location: [place];  Service: Gynecology;  Laterality: N/A;   LEFT HEART CATH AND CORONARY ANGIOGRAPHY N/A 08/13/2018   Procedure: LEFT HEART CATH AND CORONARY ANGIOGRAPHY;  Surgeon: Troy Sine, MD;  Location: [place] [facility];  Service: Cardiovascular;  Laterality: N/A;    Current Medications: Current Meds  Medication Sig   amLODipine (NORVASC) 10 MG tablet Take 1 tablet by mouth daily.   Ascorbic Acid (VITAMIN C) 1000 MG tablet Take 1,000 mg by mouth daily.    aspirin EC 81 MG tablet Take 81 mg by mouth daily.   Cholecalciferol (VITAMIN D3) 2000 units capsule Take 2,000 Units by mouth daily.   ferrous sulfate 325 (65 FE) MG tablet Take 325 mg by mouth daily.   losartan (COZAAR) 100 MG tablet Take 100 mg by mouth daily.   meclizine (ANTIVERT) 25 MG tablet Take 25 mg by mouth 3 (three) times daily as needed for dizziness.   metFORMIN (GLUCOPHAGE-XR) 500 MG 24 hr tablet Take 500 mg by mouth every evening.   metoprolol tartrate (LOPRESSOR) 50 MG tablet Take 50 mg by mouth 2  (two) times daily.   spironolactone (ALDACTONE) 25 MG tablet Take 25 mg by mouth daily.   [DISCONTINUED] furosemide (LASIX) 40 MG tablet TAKE 1 TABLET BY MOUTH TWICE DAILY AND AN EXTRA TABLET ON MONDAY, WEDNESDAY AND FRIDAY     Allergies:   Patient has no known allergies.   Social History   Socioeconomic History   Marital status: Single    Spouse name: Not on file   Number of children: Not on file   Years of education: Not on file   Highest education level: Not on file  Occupational History   Not on file  Tobacco Use   Smoking status: Former    Packs/day: 0.50    Years: 20.00    Total pack years: 10.00    Types: Cigarettes    Quit date: 07/15/2018    Years since quitting: 4.0   Smokeless tobacco: Never  Vaping Use   Vaping Use: Never used  Substance and Sexual Activity   Alcohol use: Not Currently   Drug use: Never   Sexual activity: Not Currently    Comment: 1st  intercourse 51 yo-More than 5 partners  Other Topics Concern   Not on file  Social History Narrative   Not on file   Social Determinants of Health   Financial Resource Strain: Not on file  Food Insecurity: Not on file  Transportation Needs: Not on file  Physical Activity: Not on file  Stress: Not on file  Social Connections: Not on file     Family History: The patient's family history includes Cancer in her father; Congestive Heart Failure in her mother; Diabetes in her father; Hypertension in her father and mother; Kidney cancer in her son. ROS:   Please see the history of present illness.    All other systems reviewed and are negative.  EKGs/Labs/Other Studies Reviewed:    The following studies were reviewed today:  EKG:  EKG ordered today and personally reviewed.  The ekg ordered today demonstrates sinus rhythm marked LVH repolarization may represent nonobstructive apical hypertrophic cardiomyopathy  Recent Labs: No results found for requested labs within last 365 days.  Recent Lipid Panel     Component Value Date/Time   CHOL 184 05/27/2018 1335   TRIG 54 05/27/2018 1335   HDL 48 05/27/2018 1335   CHOLHDL 3.8 05/27/2018 1335   LDLCALC 125 (H) 05/27/2018 1335    Physical Exam:    VS:  BP (!) 142/89 (BP Location: Right Arm, Patient Position: Sitting, Cuff Size: Large)   Pulse 65   Ht '5\' 7"'$  (1.702 m)   Wt 273 lb (123.8 kg)   LMP  (LMP Unknown)   SpO2 99%   BMI 42.76 kg/m     Wt Readings from Last 3 Encounters:  08/15/22 273 lb (123.8 kg)  03/28/21 263 lb 4.8 oz (119.4 kg)  08/18/20 261 lb (118.4 kg)     GEN:  Well nourished, well developed in no acute distress HEENT: Normal NECK: No JVD; No carotid bruits LYMPHATICS: No lymphadenopathy CARDIAC: RRR, no murmurs, rubs, gallops RESPIRATORY:  Clear to auscultation without rales, wheezing or rhonchi  ABDOMEN: Soft, non-tender, non-distended MUSCULOSKELETAL:  No edema; No deformity  SKIN: Warm and dry NEUROLOGIC:  Alert and oriented x 3 PSYCHIATRIC:  Normal affect    Signed, Shirlee More, MD  08/15/2022 11:38 AM    [facility]

## 2022-08-15 NOTE — Patient Instructions (Signed)
Medication Instructions:  Your physician recommends that you continue on your current medications as directed. Please refer to the Current Medication list given to you today.  *If you need a refill on your cardiac medications before your next appointment, please call your pharmacy*   Lab Work: BMP, ProBNP- today If you have labs (blood work) drawn today and your tests are completely normal, you will receive your results only by: [facility] (if you have MyChart) OR A paper copy in the mail If you have any lab test that is abnormal or we need to change your treatment, we will call you to review the results.   Testing/Procedures: Your physician has requested that you have an echocardiogram. Echocardiography is a painless test that uses sound waves to create images of your heart. It provides your doctor with information about the size and shape of your heart and how well your heart's chambers and valves are working. This procedure takes approximately one hour. There are no restrictions for this procedure. Please do NOT wear cologne, perfume, aftershave, or lotions (deodorant is allowed). Please arrive 15 minutes prior to your appointment time.    Follow-Up: At [facility], you and your health needs are our priority.  As part of our continuing mission to provide you with exceptional heart care, we have created designated Provider Care Teams.  These Care Teams include your primary Cardiologist (physician) and Advanced Practice Providers (APPs -  Physician Assistants and Nurse Practitioners) who all work together to provide you with the care you need, when you need it.  We recommend signing up for the patient portal called "MyChart".  Sign up information is provided on this After Visit Summary.  MyChart is used to connect with patients for Virtual Visits (Telemedicine).  Patients are able to view lab/test results, encounter notes, upcoming appointments, etc.  Non-urgent messages can be sent to  your provider as well.   To learn more about what you can do with MyChart, go to [email or web address].    Your next appointment:   6 month(s)  The format for your next appointment:   In Person  Provider:   Shirlee More, MD    Other Instructions:  Send a note to Dr. Joya Gaskins office about Blood pressures in 2 weeks if frequently less that 120 on the top (systolic)

## 2022-08-16 LAB — BASIC METABOLIC PANEL
BUN/Creatinine Ratio: 21 (ref 9–23)
BUN: 15 mg/dL (ref 6–24)
CO2: 26 mmol/L (ref 20–29)
Calcium: 10 mg/dL (ref 8.7–10.2)
Chloride: 101 mmol/L (ref 96–106)
Creatinine, Ser: 0.72 mg/dL (ref 0.57–1.00)
Glucose: 89 mg/dL (ref 70–99)
Potassium: 4.9 mmol/L (ref 3.5–5.2)
Sodium: 141 mmol/L (ref 134–144)
eGFR: 101 mL/min/{1.73_m2} (ref >59)

## 2022-08-16 LAB — PRO B NATRIURETIC PEPTIDE: NT-Pro BNP: 165 pg/mL (ref 0–249)

## 2022-08-17 ENCOUNTER — Ambulatory Visit (HOSPITAL_BASED_OUTPATIENT_CLINIC_OR_DEPARTMENT_OTHER)
Admission: RE | Admit: 2022-08-17 | Discharge: 2022-08-17 | Disposition: A | Discharge status: Home / Self Care | Payer: Commercial Managed Care - HMO | Source: Ambulatory Visit | Attending: Cardiology | Admitting: Cardiology

## 2022-08-17 DIAGNOSIS — I5030 Unspecified diastolic (congestive) heart failure: Secondary | ICD-10-CM

## 2022-08-17 DIAGNOSIS — I517 Cardiomegaly: Secondary | ICD-10-CM | POA: Insufficient documentation

## 2022-08-17 LAB — ECHOCARDIOGRAM COMPLETE
AR max vel: 2.95 cm2
AV Area VTI: 2.87 cm2
AV Area mean vel: 2.94 cm2
AV Mean grad: 5 mmHg
AV Peak grad: 11 mmHg
Ao pk vel: 1.66 m/s
Area-P 1/2: 3.7 cm2
Calc EF: 68.6 %
S' Lateral: 2.3 cm
Single Plane A2C EF: 62.4 %
Single Plane A4C EF: 72 %

## 2022-08-30 ENCOUNTER — Ambulatory Visit: Payer: Self-pay | Admitting: Cardiology

## 2022-08-31 ENCOUNTER — Encounter: Payer: Self-pay | Admitting: Neurology

## 2022-09-11 NOTE — Progress Notes (Unsigned)
Initial neurology clinic note  Kayla Carey MRN: [id number] DOB: 06/19/1971  Referring provider: Clarisa Carey*  Primary care provider: Benito Mccreedy, MD  Reason for consult:  dizziness  Subjective:  This is [name], a 51 y.o. ***-handed female with a medical history of HTN, cardiomegaly, hyperaldosteronism*** who presents to neurology clinic with ***. The patient is accompanied by ***.  ***  Dizzy and imbalance when walking. Also tinnitus. Reportedly had a negative CTH ~07/2022.  ?photophobia, phonophobia, nausea, and vomiting***  ?DM - why on metformin?  On meclizine?***  MEDICATIONS:  Outpatient Encounter Medications as of 09/13/2022  Medication Sig   amLODipine (NORVASC) 10 MG tablet Take 1 tablet by mouth daily.   Ascorbic Acid (VITAMIN C) 1000 MG tablet Take 1,000 mg by mouth daily.    aspirin EC 81 MG tablet Take 81 mg by mouth daily.   Cholecalciferol (VITAMIN D3) 2000 units capsule Take 2,000 Units by mouth daily.   ferrous sulfate 325 (65 FE) MG tablet Take 325 mg by mouth daily.   losartan (COZAAR) 100 MG tablet Take 100 mg by mouth daily.   meclizine (ANTIVERT) 25 MG tablet Take 25 mg by mouth 3 (three) times daily as needed for dizziness.   metFORMIN (GLUCOPHAGE-XR) 500 MG 24 hr tablet Take 500 mg by mouth every evening.   metoprolol tartrate (LOPRESSOR) 50 MG tablet Take 50 mg by mouth 2 (two) times daily.   spironolactone (ALDACTONE) 25 MG tablet Take 25 mg by mouth daily.   No facility-administered encounter medications on file as of 09/13/2022.    PAST MEDICAL HISTORY: Past Medical History:  Diagnosis Date   Abnormal electrocardiogram (ECG) (EKG) 08/11/2018   Abnormal myocardial perfusion study 08/11/2018   Cervical cancer [facility])    gyn oncologist-  dr Kayla Carey--  bx 08-25-2018  ,  invasive SCC, Stage IA1   Chest pain 08/01/2018   Chronic diastolic (congestive) heart failure [facility])    cardiologist-  dr Kayla Carey   GERD  (gastroesophageal reflux disease)    per pt just drinks water   Hypertension    Hypertensive heart disease 08/01/2018   IDA (iron deficiency anemia)    Malignant neoplasm of cervix ([place])     PAST SURGICAL HISTORY: Past Surgical History:  Procedure Laterality Date   BREAST BIOPSY Left    over 30 years ago in [place]- benign   BREAST SURGERY Left age 70   Lumpectomy-benign   CERVICAL CONIZATION W/BX N/A 08/25/2018   Procedure: CONIZATION CERVIX WITH BIOPSY AND ENDOCERVICAL CURETTAGE;  Surgeon: Kayla Caprice, MD;  Location: [place];  Service: Gynecology;  Laterality: N/A;   LEFT HEART CATH AND CORONARY ANGIOGRAPHY N/A 08/13/2018   Procedure: LEFT HEART CATH AND CORONARY ANGIOGRAPHY;  Surgeon: Kayla Sine, MD;  Location: [place] [facility];  Service: Cardiovascular;  Laterality: N/A;    ALLERGIES: No Known Allergies  FAMILY HISTORY: Family History  Problem Relation Age of Onset   Hypertension Mother    Congestive Heart Failure Mother    Hypertension Father    Diabetes Father    Cancer Father        Prostate   Kidney cancer Son        son died from disease    SOCIAL HISTORY: Social History   Tobacco Use   Smoking status: Former    Packs/day: 0.50    Years: 20.00    Total pack years: 10.00    Types: Cigarettes    Quit date:  07/15/2018    Years since quitting: 4.1   Smokeless tobacco: Never  Vaping Use   Vaping Use: Never used  Substance Use Topics   Alcohol use: Not Currently   Drug use: Never   Social History   Social History Narrative   Not on file    Objective:  Vital Signs:  LMP  (LMP Unknown)   ***  Labs and Imaging review: Internal labs: Lab Results  Component Value Date   HGBA1C 5.4 05/27/2018   Normal or unremarkable: BMP  ***  External labs: ***  Imaging: ***  Assessment/Plan:  Kayla Carey is a 51 y.o. female who presents for evaluation of ***. *** has a relevant medical history of ***. ***  neurological examination is pertinent for ***. Available diagnostic data is significant for ***. This constellation of symptoms and objective data would most likely localize to ***. ***  PLAN: -Blood work: *** ***  -Return to clinic ***  The impression above as well as the plan as outlined below were extensively discussed with the patient (in the company of ***) who voiced understanding. All questions were answered to their satisfaction.  The patient was counseled on pertinent fall precautions per the printed material provided today, and as noted under the "Patient Instructions" section below.***  When available, results of the above investigations and possible further recommendations will be communicated to the patient via telephone/MyChart. Patient to call office if not contacted after expected testing turnaround time.   Total time spent reviewing records, interview, history/exam, documentation, and coordination of care on day of encounter:  *** min   Thank you for allowing me to participate in patient's care.  If I can answer any additional questions, I would be pleased to do so.  Kayla Levins, MD   CC: Kayla Mccreedy, MD [address] [place] [address]  CC: Referring provider: Mattie Marlin, FNP [address] [place],  [place] [id number]

## 2022-09-13 ENCOUNTER — Ambulatory Visit (INDEPENDENT_AMBULATORY_CARE_PROVIDER_SITE_OTHER): Payer: Commercial Managed Care - HMO | Admitting: Neurology

## 2022-09-13 ENCOUNTER — Encounter: Payer: Self-pay | Admitting: Neurology

## 2022-09-13 VITALS — Ht 67.0 in | Wt 276.6 lb

## 2022-09-13 DIAGNOSIS — H8120 Vestibular neuronitis, unspecified ear: Secondary | ICD-10-CM | POA: Diagnosis not present

## 2022-09-13 DIAGNOSIS — R42 Dizziness and giddiness: Secondary | ICD-10-CM | POA: Diagnosis not present

## 2022-09-13 MED ORDER — MECLIZINE HCL 25 MG PO TABS: 25.0000 mg | ORAL_TABLET | Freq: Three times a day (TID) | ORAL | 2 refills | Status: DC | PRN

## 2022-09-13 NOTE — Patient Instructions (Signed)
I gave you a refill of meclizine to take as needed for dizziness. I sent to it [place] as you requested.  I think your dizziness is the result of an inner ear problem, maybe a virus that attacked that nerve. I expect you will continue to get better.  Call my office if you are not or are getting worse.  I want to see you back in clinic in 2 months.  The physicians and staff at [facility] are committed to providing excellent care. You may receive a survey requesting feedback about your experience at our office. We strive to receive "very good" responses to the survey questions. If you feel that your experience would prevent you from giving the office a "very good " response, please contact our office to try to remedy the situation. We may be reached at [phone]. Thank you for taking the time out of your busy day to complete the survey.  Kai Levins, MD [facility]

## 2022-10-25 ENCOUNTER — Ambulatory Visit: Payer: Commercial Managed Care - HMO | Admitting: Neurology

## 2022-11-07 NOTE — Progress Notes (Deleted)
NEUROLOGY FOLLOW UP OFFICE NOTE  Netisha Oshiro PH:[phone]  Subjective:  Kayla Carey is a 52 y.o. year old right-handed female with a medical history of HTN, pre-diabetes cardiomegaly, hyperaldosteronism, former smoker who we last saw on 09/13/22.  To briefly review: Patient's symptoms started 07/20/22 at 5:30 pm while patient was at work. The night before, patient did not sleep much due to a cough. She was very tired, but went to work anyway. She felt like her body was swaying. She felt hot. She felt like she was going to black out. She got sweaty all over. She threw up as well. She had to be helped to the bathroom. Patient wondered if she had food poisoning. She went to stay with sister but continued to be very hot. She was throwing up for an hour. She slept for 2 hours and did not feel better with ice all over. She did not have a fever though or body pain. When she went to the hospital, so was off balance and was seeing double. This would resolve with closing one eye. The hot feeling resolved when she was taken to the ED. The double vision lasted for 4-7 days. She was still having imbalance. She went back to the ED on 07/24/22 because of the imbalance. At the ED, she had a "body scan from head down" the first time. The second time she went to the ED, she had a CTH. Both scans were normal per patient (do not have records or images). She was given exercises for "turning her head" which she has been doing. She was prescribed meclizine. It was helping. She ran out earlier this week. Symptoms have been a little more over the past couple of days.    She does not feel it when she is sitting down (not dizzy). She denies headache. She may have occasional ringing in the ears. She would get worsening with changing positions or turning her head. She does feel like the dizziness is improving, but that there is an empty feeling in her head, like an echo in an empty room.   EtOH: None since symptom onset,  but was 1-2 beers per day previously Restrictive diet: No Family hx of neurologic disease: Father with diabetic neuropathy  Most recent Assessment and Plan (09/13/22): Her neurological examination is essentially normal today. Available diagnostic data is significant for outside [facility] being normal by patient report. Orthostatic vitals today were negative for orthostatic hypotension. Patient's exam today is reassuring without focal neurologic deficits to suggest a central etiology of patient's symptoms. Potential etiologies include BPPV (though testing today was negative, it is still possible) vs vestibular neuritis. I favor this given her unwell feeling for some time. Perhaps she had a viral attack and is now slowly getting better. We discussed and agreed to monitor symptoms for improvement with symptomatic treatment with meclizine which was helping. If she does not continue to improve, or worsens, we will pursue MRI brain.   PLAN: -Will monitor symptoms for continued improvement. Patient to call if not improving or worsening -Refilled meclizine  Since their last visit: ***  MEDICATIONS:  Outpatient Encounter Medications as of 11/14/2022  Medication Sig   amLODipine (NORVASC) 10 MG tablet Take 1 tablet by mouth daily.   Ascorbic Acid (VITAMIN C) 1000 MG tablet Take 1,000 mg by mouth daily.    aspirin EC 81 MG tablet Take 81 mg by mouth daily.   Cholecalciferol (VITAMIN D3) 2000 units capsule Take 2,000 Units by mouth daily.  ferrous sulfate 325 (65 FE) MG tablet Take 325 mg by mouth daily.   losartan (COZAAR) 100 MG tablet Take 100 mg by mouth daily.   meclizine (ANTIVERT) 25 MG tablet Take 1 tablet (25 mg total) by mouth 3 (three) times daily as needed for dizziness.   metFORMIN (GLUCOPHAGE-XR) 500 MG 24 hr tablet Take 500 mg by mouth every evening.   metoprolol tartrate (LOPRESSOR) 50 MG tablet Take 50 mg by mouth 2 (two) times daily.   spironolactone (ALDACTONE) 25 MG tablet Take 25 mg by  mouth 2 (two) times daily.   No facility-administered encounter medications on file as of 11/14/2022.    PAST MEDICAL HISTORY: Past Medical History:  Diagnosis Date   Abnormal electrocardiogram (ECG) (EKG) 08/11/2018   Abnormal myocardial perfusion study 08/11/2018   Cervical cancer [facility])    gyn oncologist-  dr Gerarda Fraction--  bx 08-25-2018  ,  invasive SCC, Stage IA1   Chest pain 08/01/2018   Chronic diastolic (congestive) heart failure [facility])    cardiologist-  dr Bettina Gavia   GERD (gastroesophageal reflux disease)    per pt just drinks water   Hypertension    Hypertensive heart disease 08/01/2018   IDA (iron deficiency anemia)    Malignant neoplasm of cervix ([place])     PAST SURGICAL HISTORY: Past Surgical History:  Procedure Laterality Date   BREAST BIOPSY Left    over [place] years ago in [place] country- benign   BREAST SURGERY Left age 78   Lumpectomy-benign   CERVICAL CONIZATION W/BX N/A 08/25/2018   Procedure: CONIZATION CERVIX WITH BIOPSY AND ENDOCERVICAL CURETTAGE;  Surgeon: Isabel Caprice, MD;  Location: [place];  Service: Gynecology;  Laterality: N/A;   LEFT HEART CATH AND CORONARY ANGIOGRAPHY N/A 08/13/2018   Procedure: LEFT HEART CATH AND CORONARY ANGIOGRAPHY;  Surgeon: Troy Sine, MD;  Location: [place] [facility];  Service: Cardiovascular;  Laterality: N/A;    ALLERGIES: No Known Allergies  FAMILY HISTORY: Family History  Problem Relation Age of Onset   Hypertension Mother    Congestive Heart Failure Mother    Hypertension Father    Diabetes Father    Cancer Father        Prostate   Kidney cancer Son        son died from disease    SOCIAL HISTORY: Social History   Tobacco Use   Smoking status: Former    Packs/day: 0.50    Years: 20.00    Total pack years: 10.00    Types: Cigarettes    Quit date: 07/15/2018    Years since quitting: 4.3   Smokeless tobacco: Never  Vaping Use   Vaping Use: Never used  Substance Use Topics   Alcohol  use: Not Currently   Drug use: Never   Social History   Social History Narrative   Are you right handed or left handed? right   Are you currently employed ? self   What is your current occupation?   Do you live at home alone?with family   Who lives with you?    What type of home do you live in: 1 story or 2 story? two          Objective:  Vital Signs:  LMP  (LMP Unknown)   ***  Labs and Imaging review: No new results  Previously reviewed results: Lab Results  Component Value Date    HGBA1C 5.4 05/27/2018      Normal or unremarkable: BMP  Assessment/Plan:  This is Linna Holmon, a 52 y.o. female with: ***   Plan: ***  Return to clinic in ***  Total time spent reviewing records, interview, history/exam, documentation, and coordination of care on day of encounter:  *** min  Kai Levins, MD

## 2022-11-14 ENCOUNTER — Ambulatory Visit: Payer: Commercial Managed Care - HMO | Admitting: Neurology

## 2023-01-17 NOTE — Progress Notes (Signed)
NEUROLOGY FOLLOW UP OFFICE NOTE  Kayla Carey [id number]  Subjective:  Kayla Carey is a 52 y.o. year old right-handed female with a medical history of HTN, pre-diabetes cardiomegaly, hyperaldosteronism, former smoker who we last saw on 09/13/22.  To briefly review: Patient's symptoms started 07/20/22 at 5:30 pm while patient was at work. The night before, patient did not sleep much due to a cough. She was very tired, but went to work anyway. She felt like her body was swaying. She felt hot. She felt like she was going to black out. She got sweaty all over. She threw up as well. She had to be helped to the bathroom. Patient wondered if she had food poisoning. She went to stay with sister but continued to be very hot. She was throwing up for an hour. She slept for 2 hours and did not feel better with ice all over. She did not have a fever though or body pain. When she went to the hospital, so was off balance and was seeing double. This would resolve with closing one eye. The hot feeling resolved when she was taken to the ED. The double vision lasted for 4-7 days. She was still having imbalance. She went back to the ED on 07/24/22 because of the imbalance. At the ED, she had a "body scan from head down" the first time. The second time she went to the ED, she had a CTH. Both scans were normal per patient (do not have records or images). She was given exercises for "turning her head" which she has been doing. She was prescribed meclizine. It was helping. She ran out earlier this week. Symptoms have been a little more over the past couple of days.    She does not feel it when she is sitting down (not dizzy). She denies headache. She may have occasional ringing in the ears. She would get worsening with changing positions or turning her head. She does feel like the dizziness is improving, but that there is an empty feeling in her head, like an echo in an empty room.   EtOH: None since symptom onset,  but was 1-2 beers per day previously Restrictive diet: No Family hx of neurologic disease: Father with diabetic neuropathy   Most recent Assessment and Plan (09/13/22): Her neurological examination is essentially normal today. Available diagnostic data is significant for outside [facility] being normal by patient report. Orthostatic vitals today were negative for orthostatic hypotension. Patient's exam today is reassuring without focal neurologic deficits to suggest a central etiology of patient's symptoms. Potential etiologies include BPPV (though testing today was negative, it is still possible) vs vestibular neuritis. I favor this given her unwell feeling for some time. Perhaps she had a viral attack and is now slowly getting better. We discussed and agreed to monitor symptoms for improvement with symptomatic treatment with meclizine which was helping. If she does not continue to improve, or worsens, we will pursue MRI brain.   PLAN: -Will monitor symptoms for continued improvement. Patient to call if not improving or worsening -Refilled meclizine  Since their last visit: Patient is doing well with minimal symptoms. She will occasionally feel her head spin when she is very busy and moving a lot. She has not taken meclizine over the last month.  She has no new complaints today.  MEDICATIONS:  Outpatient Encounter Medications as of 01/25/2023  Medication Sig   amLODipine (NORVASC) 10 MG tablet Take 1 tablet (10 mg total) by mouth daily.  Ascorbic Acid (VITAMIN C) 1000 MG tablet Take 1,000 mg by mouth daily.    aspirin EC 81 MG tablet Take 81 mg by mouth daily.   Cholecalciferol (VITAMIN D3) 2000 units capsule Take 2,000 Units by mouth daily.   ferrous sulfate 325 (65 FE) MG tablet Take 1 tablet (325 mg total) by mouth daily with breakfast.   losartan (COZAAR) 100 MG tablet Take 1 tablet (100 mg total) by mouth daily.   meclizine (ANTIVERT) 25 MG tablet Take 1 tablet (25 mg total) by mouth 3  (three) times daily as needed for dizziness.   metFORMIN (GLUCOPHAGE-XR) 750 MG 24 hr tablet Take 1 tablet (750 mg total) by mouth every evening.   metoprolol tartrate (LOPRESSOR) 50 MG tablet Take 1 tablet (50 mg total) by mouth 2 (two) times daily.   spironolactone (ALDACTONE) 50 MG tablet Take 1 tablet (50 mg total) by mouth 2 (two) times daily.   [DISCONTINUED] amLODipine (NORVASC) 10 MG tablet Take 1 tablet by mouth daily.   [DISCONTINUED] ferrous sulfate 325 (65 FE) MG tablet Take 325 mg by mouth daily. (Patient not taking: Reported on 01/25/2023)   [DISCONTINUED] losartan (COZAAR) 100 MG tablet Take 100 mg by mouth daily.   [DISCONTINUED] meclizine (ANTIVERT) 25 MG tablet Take 1 tablet (25 mg total) by mouth 3 (three) times daily as needed for dizziness.   [DISCONTINUED] metFORMIN (GLUCOPHAGE-XR) 750 MG 24 hr tablet Take 750 mg by mouth every evening.   [DISCONTINUED] metoprolol tartrate (LOPRESSOR) 50 MG tablet Take 50 mg by mouth 2 (two) times daily.   [DISCONTINUED] spironolactone (ALDACTONE) 50 MG tablet Take 50 mg by mouth 2 (two) times daily.   No facility-administered encounter medications on file as of 01/25/2023.    PAST MEDICAL HISTORY: Past Medical History:  Diagnosis Date   Abnormal electrocardiogram (ECG) (EKG) 08/11/2018   Abnormal myocardial perfusion study 08/11/2018   Cervical cancer    gyn oncologist-  dr Doroteo Glassman--  bx 08-25-2018  ,  invasive SCC, Stage IA1   Chest pain 08/01/2018   Chronic diastolic (congestive) heart failure    cardiologist-  dr Dulce Sellar   GERD (gastroesophageal reflux disease)    per pt just drinks water   Hypertension    Hypertensive heart disease 08/01/2018   IDA (iron deficiency anemia)    Malignant neoplasm of cervix     PAST SURGICAL HISTORY: Past Surgical History:  Procedure Laterality Date   BREAST BIOPSY Left    over [place]- benign   BREAST SURGERY Left age 33   Lumpectomy-benign   CERVICAL CONIZATION  W/BX N/A 08/25/2018   Procedure: CONIZATION CERVIX WITH BIOPSY AND ENDOCERVICAL CURETTAGE;  Surgeon: Shonna Chock, MD;  Location: [facility];  Service: Gynecology;  Laterality: N/A;   LEFT HEART CATH AND CORONARY ANGIOGRAPHY N/A 08/13/2018   Procedure: LEFT HEART CATH AND CORONARY ANGIOGRAPHY;  Surgeon: Lennette Bihari, MD;  Location: [facility];  Service: Cardiovascular;  Laterality: N/A;    ALLERGIES: No Known Allergies  FAMILY HISTORY: Family History  Problem Relation Age of Onset   Hypertension Mother    Congestive Heart Failure Mother    Hypertension Father    Diabetes Father    Cancer Father        Prostate   Kidney cancer Son        son died from disease    SOCIAL HISTORY: Social History   Tobacco Use   Smoking status: Former  Packs/day: 0.50    Years: 20.00    Additional pack years: 0.00    Total pack years: 10.00    Types: Cigarettes    Quit date: 07/15/2018    Years since quitting: 4.5   Smokeless tobacco: Never  Vaping Use   Vaping Use: Never used  Substance Use Topics   Alcohol use: Not Currently   Drug use: Never   Social History   Social History Narrative   Are you right handed or left handed? right   Are you currently employed ? self   What is your current occupation?   Do you live at home alone?with family   Who lives with you?    What type of home do you live in: 1 story or 2 story? two          Objective:  Vital Signs:  BP 119/72   Pulse 68   Resp 18   Ht 5\' 7"  (1.702 m)   Wt 288 lb (130.6 kg)   LMP  (LMP Unknown)   SpO2 98%   BMI 45.11 kg/m   General: No acute distress.  Patient appears well-groomed.   Head:  Normocephalic/atraumatic Neck: supple, full range of motion Heart:  Regular rate and rhythm. No carotid bruits Lungs:  Clear to auscultation bilaterally Neurological Exam: alert and oriented.  Speech fluent and not dysarthric, language intact.  CN II-XII intact. Bulk and tone normal, muscle  strength 5/5 throughout.  Sensation to light touch intact.  Deep tendon reflexes 2+ throughout.  Finger to nose testing intact.  Gait normal, Romberg negative.   Labs and Imaging review: No new results   Previously reviewed results:      Lab Results  Component Value Date    HGBA1C 5.4 05/27/2018      Normal or unremarkable: BMP  Assessment/Plan:  This is Minerva Boeder, a 52 y.o. female with vertigo and dizziness that has now resolved. Symptoms were likely secondary to vestibular neuritis vs BPPV.    Plan: -Discussed natural history of both BPPV and vestibular neuritis and signs to watch for -Patient will reach out with new or worsening symptoms  Return to clinic as needed.  Total time spent reviewing records, interview, history/exam, documentation, and coordination of care on day of encounter:  15 min  Jacquelyne Balint, MD

## 2023-01-25 ENCOUNTER — Ambulatory Visit: Payer: 59 | Admitting: Neurology

## 2023-01-25 ENCOUNTER — Encounter: Payer: Self-pay | Admitting: Neurology

## 2023-01-25 ENCOUNTER — Ambulatory Visit: Payer: 59 | Admitting: Family Medicine

## 2023-01-25 VITALS — BP 119/72 | HR 68 | Resp 18 | Ht 67.0 in | Wt 288.0 lb

## 2023-01-25 VITALS — BP 134/78 | HR 70 | Temp 97.8°F | Ht 67.0 in | Wt 274.6 lb

## 2023-01-25 DIAGNOSIS — I1A0 Resistant hypertension: Secondary | ICD-10-CM

## 2023-01-25 DIAGNOSIS — R7303 Prediabetes: Secondary | ICD-10-CM

## 2023-01-25 DIAGNOSIS — C539 Malignant neoplasm of cervix uteri, unspecified: Secondary | ICD-10-CM | POA: Diagnosis not present

## 2023-01-25 DIAGNOSIS — I110 Hypertensive heart disease with heart failure: Secondary | ICD-10-CM

## 2023-01-25 DIAGNOSIS — H8120 Vestibular neuronitis, unspecified ear: Secondary | ICD-10-CM

## 2023-01-25 DIAGNOSIS — Z1231 Encounter for screening mammogram for malignant neoplasm of breast: Secondary | ICD-10-CM

## 2023-01-25 DIAGNOSIS — R42 Dizziness and giddiness: Secondary | ICD-10-CM | POA: Diagnosis not present

## 2023-01-25 DIAGNOSIS — R739 Hyperglycemia, unspecified: Secondary | ICD-10-CM

## 2023-01-25 DIAGNOSIS — I5032 Chronic diastolic (congestive) heart failure: Secondary | ICD-10-CM

## 2023-01-25 DIAGNOSIS — D509 Iron deficiency anemia, unspecified: Secondary | ICD-10-CM

## 2023-01-25 MED ORDER — LOSARTAN POTASSIUM 100 MG PO TABS
100.0000 mg | ORAL_TABLET | Freq: Every day | ORAL | 0 refills | Status: DC
Start: 2023-01-25 — End: 2023-06-10

## 2023-01-25 MED ORDER — MECLIZINE HCL 25 MG PO TABS: 25.0000 mg | ORAL_TABLET | Freq: Three times a day (TID) | ORAL | 2 refills | Status: DC | PRN

## 2023-01-25 MED ORDER — SPIRONOLACTONE 50 MG PO TABS
50.0000 mg | ORAL_TABLET | Freq: Two times a day (BID) | ORAL | 0 refills | Status: DC
Start: 2023-01-25 — End: 2023-06-10

## 2023-01-25 MED ORDER — AMLODIPINE BESYLATE 10 MG PO TABS: 10.0000 mg | ORAL_TABLET | Freq: Every day | ORAL | 0 refills | Status: DC

## 2023-01-25 MED ORDER — FERROUS SULFATE 325 (65 FE) MG PO TABS
325.0000 mg | ORAL_TABLET | Freq: Every day | ORAL | 0 refills | Status: DC
Start: 2023-01-25 — End: 2023-06-10

## 2023-01-25 MED ORDER — METFORMIN HCL ER 750 MG PO TB24
750.0000 mg | ORAL_TABLET | Freq: Every evening | ORAL | 0 refills | Status: DC
Start: 2023-01-25 — End: 2023-06-10

## 2023-01-25 MED ORDER — METOPROLOL TARTRATE 50 MG PO TABS
50.0000 mg | ORAL_TABLET | Freq: Two times a day (BID) | ORAL | 0 refills | Status: DC
Start: 2023-01-25 — End: 2023-06-10

## 2023-01-25 NOTE — Progress Notes (Unsigned)
Assessment/Plan:   Problem List Items Addressed This Visit       Cardiovascular and Mediastinum   Hypertensive heart disease - Primary   Relevant Medications   amLODipine (NORVASC) 10 MG tablet   losartan (COZAAR) 100 MG tablet   metoprolol tartrate (LOPRESSOR) 50 MG tablet   spironolactone (ALDACTONE) 50 MG tablet   Hypertension   Relevant Medications   amLODipine (NORVASC) 10 MG tablet   losartan (COZAAR) 100 MG tablet   metoprolol tartrate (LOPRESSOR) 50 MG tablet   spironolactone (ALDACTONE) 50 MG tablet     Genitourinary   Malignant neoplasm of cervix   Relevant Medications   meclizine (ANTIVERT) 25 MG tablet     Other   IDA (iron deficiency anemia)   Relevant Medications   ferrous sulfate 325 (65 FE) MG tablet   Other Visit Diagnoses     Vertigo       Relevant Medications   meclizine (ANTIVERT) 25 MG tablet   Vestibular neuronitis, unspecified laterality       Relevant Medications   meclizine (ANTIVERT) 25 MG tablet   Screening mammogram for breast cancer       Relevant Orders   MM Digital Screening   Hyperglycemia       Relevant Medications   metFORMIN (GLUCOPHAGE-XR) 750 MG 24 hr tablet       Medications Discontinued During This Encounter  Medication Reason   ferrous sulfate 325 (65 FE) MG tablet Reorder   amLODipine (NORVASC) 10 MG tablet Reorder   metoprolol tartrate (LOPRESSOR) 50 MG tablet Reorder   losartan (COZAAR) 100 MG tablet Reorder   metFORMIN (GLUCOPHAGE-XR) 750 MG 24 hr tablet Reorder   spironolactone (ALDACTONE) 50 MG tablet Reorder   meclizine (ANTIVERT) 25 MG tablet Reorder    Return in about 4 weeks (around 02/22/2023).    Subjective:   Encounter date: 01/25/2023  Kayla Carey is a 52 y.o. female who has Hypertensive heart disease; GERD (gastroesophageal reflux disease); Chest pain; IDA (iron deficiency anemia); Abnormal electrocardiogram (ECG) (EKG); Abnormal myocardial perfusion study; Malignant neoplasm of cervix;  Cervical cancer; Hypertension; and Chronic diastolic (congestive) heart failure on their problem list..   She  has a past medical history of Abnormal electrocardiogram (ECG) (EKG) (08/11/2018), Abnormal myocardial perfusion study (08/11/2018), Cervical cancer, Chest pain (08/01/2018), Chronic diastolic (congestive) heart failure, GERD (gastroesophageal reflux disease), Hypertension, Hypertensive heart disease (08/01/2018), IDA (iron deficiency anemia), and Malignant neoplasm of cervix.Marland Kitchen   CHIEF COMPLAINT: Patient presents to establish care and manage hypertension.  HISTORY OF PRESENT ILLNESS:  Hypertension: Patient currently managed on amlodipine, losartan, metoprolol, and spironolactone for hypertensive heart disease. No complaints of chest pain or dyspnea. Blood pressure today is 134/78. Vertigo: Patient experienced an episode of vertigo on 10/06, managed with meclizine, and reports improvement. She has an upcoming neurologist appointment. Iron Deficiency Anemia: Previously on iron supplementation, which the patient states she has not taken for the past 6 months. Diabetes: On metformin for prediabetes and weight management, not due to a diabetes diagnosis.  HPI:   Review of Systems  Constitutional:  Positive for malaise/fatigue (Worse with exertion). Negative for chills, diaphoresis, fever and weight loss.  HENT:  Negative for congestion, ear discharge, ear pain and hearing loss.   Eyes:  Negative for blurred vision, double vision, photophobia, pain, discharge and redness.  Respiratory:  Negative for cough, sputum production, shortness of breath and wheezing.   Cardiovascular:  Positive for leg swelling (Intermittent, worse with prolonged standing). Negative for chest pain and  palpitations.  Gastrointestinal:  Negative for abdominal pain, blood in stool, constipation, diarrhea, heartburn, melena, nausea and vomiting.  Genitourinary:  Negative for dysuria, flank pain, frequency, hematuria and  urgency.  Musculoskeletal:  Negative for myalgias.  Skin:  Negative for itching and rash.  Neurological:  Positive for dizziness (it is improved). Negative for tingling, tremors, speech change, seizures, loss of consciousness, weakness and headaches.  Psychiatric/Behavioral:  Negative for depression, hallucinations, memory loss, substance abuse and suicidal ideas. The patient does not have insomnia.     Past Surgical History:  Procedure Laterality Date   BREAST BIOPSY Left    over 30 years ago in [place] country- benign   BREAST SURGERY Left age 69   Lumpectomy-benign   CERVICAL CONIZATION W/BX N/A 08/25/2018   Procedure: CONIZATION CERVIX WITH BIOPSY AND ENDOCERVICAL CURETTAGE;  Surgeon: Shonna Chock, MD;  Location: [facility];  Service: Gynecology;  Laterality: N/A;   LEFT HEART CATH AND CORONARY ANGIOGRAPHY N/A 08/13/2018   Procedure: LEFT HEART CATH AND CORONARY ANGIOGRAPHY;  Surgeon: Lennette Bihari, MD;  Location: [facility];  Service: Cardiovascular;  Laterality: N/A;    Outpatient Medications Prior to Visit  Medication Sig Dispense Refill   Ascorbic Acid (VITAMIN C) 1000 MG tablet Take 1,000 mg by mouth daily.      aspirin EC 81 MG tablet Take 81 mg by mouth daily.     Cholecalciferol (VITAMIN D3) 2000 units capsule Take 2,000 Units by mouth daily.     amLODipine (NORVASC) 10 MG tablet Take 1 tablet by mouth daily.     losartan (COZAAR) 100 MG tablet Take 100 mg by mouth daily.     meclizine (ANTIVERT) 25 MG tablet Take 1 tablet (25 mg total) by mouth 3 (three) times daily as needed for dizziness. 60 tablet 2   metFORMIN (GLUCOPHAGE-XR) 750 MG 24 hr tablet Take 750 mg by mouth every evening.     metoprolol tartrate (LOPRESSOR) 50 MG tablet Take 50 mg by mouth 2 (two) times daily.     spironolactone (ALDACTONE) 50 MG tablet Take 50 mg by mouth 2 (two) times daily.     ferrous sulfate 325 (65 FE) MG tablet Take 325 mg by mouth daily. (Patient not  taking: Reported on 01/25/2023)     No facility-administered medications prior to visit.    Family History  Problem Relation Age of Onset   Hypertension Mother    Congestive Heart Failure Mother    Hypertension Father    Diabetes Father    Cancer Father        Prostate   Kidney cancer Son        son died from disease    Social History   Socioeconomic History   Marital status: Single    Spouse name: Not on file   Number of children: Not on file   Years of education: Not on file   Highest education level: Not on file  Occupational History   Not on file  Tobacco Use   Smoking status: Former    Packs/day: 0.50    Years: 20.00    Additional pack years: 0.00    Total pack years: 10.00    Types: Cigarettes    Quit date: 07/15/2018    Years since quitting: 4.5   Smokeless tobacco: Never  Vaping Use   Vaping Use: Never used  Substance and Sexual Activity   Alcohol use: Not Currently   Drug use: Never   Sexual activity:  Not Currently    Comment: 1st intercourse 52 yo-More than 5 partners  Other Topics Concern   Not on file  Social History Narrative   Are you right handed or left handed? right   Are you currently employed ? self   What is your current occupation?   Do you live at home alone?with family   Who lives with you?    What type of home do you live in: 1 story or 2 story? two       Social Determinants of Corporate investment banker Strain: Not on file  Food Insecurity: Not on file  Transportation Needs: Not on file  Physical Activity: Not on file  Stress: Not on file  Social Connections: Not on file  Intimate Partner Violence: Not on file                                                                                                  Objective:  Physical Exam: BP 134/78   Pulse 70   Temp 97.8 F (36.6 C) (Oral)   Ht  (1.702 m)   Wt 274 lb 9.6 oz (124.6 kg)   LMP  (LMP Unknown)   SpO2 96%   BMI 43.01 kg/m     Physical Exam  No  results found.  No results found for this or any previous visit (from the past 2160 hour(s)).      Garner Nash, MD, MS

## 2023-01-25 NOTE — Patient Instructions (Signed)
I am happy to see that you have greatly improved. You may never have symptoms again, but if you do, feel free to reach out to me by MyChart or phone and I can re-evaluate you.  The physicians and staff at [facility] are committed to providing excellent care. You may receive a survey requesting feedback about your experience at our office. We strive to receive "very good" responses to the survey questions. If you feel that your experience would prevent you from giving the office a "very good " response, please contact our office to try to remedy the situation. We may be reached at [phone]. Thank you for taking the time out of your busy day to complete the survey.  Jacquelyne Balint, MD [facility]

## 2023-01-28 ENCOUNTER — Ambulatory Visit: Payer: Self-pay | Admitting: Family Medicine

## 2023-01-30 DIAGNOSIS — R7303 Prediabetes: Secondary | ICD-10-CM

## 2023-01-30 DIAGNOSIS — R42 Dizziness and giddiness: Secondary | ICD-10-CM | POA: Insufficient documentation

## 2023-01-30 HISTORY — DX: Prediabetes: R73.03

## 2023-01-30 HISTORY — DX: Dizziness and giddiness: R42

## 2023-01-30 NOTE — Assessment & Plan Note (Signed)
Patient's hypertension is currently managed with a quadruple regimen. Continue current medications. Repeat blood pressure monitoring at home, report readings. Evaluate kidney function and electrolyte levels in the upcoming lab work.

## 2023-01-30 NOTE — Assessment & Plan Note (Signed)
Continue metformin for weight management and to prevent progression to diabetes. Schedule follow-up for A1C and fasting blood glucose to reassess glycemic control and efficacy of metformin.

## 2023-01-30 NOTE — Assessment & Plan Note (Signed)
Given symptomatic improvement and upcoming specialist appointments, continue meclizine as needed. Encourage follow-up with ENT and neurology as recommended, to further evaluate and manage the underlying cause of vertigo.

## 2023-01-30 NOTE — Assessment & Plan Note (Signed)
Restart iron supplementation with ferrous sulfate as previously prescribed, to address her reported iron deficiency anemia. Plan for blood work to assess hemoglobin, hematocrit, and iron studies in one month to monitor treatment response.

## 2023-02-25 ENCOUNTER — Telehealth: Payer: Self-pay | Admitting: Family Medicine

## 2023-02-25 ENCOUNTER — Ambulatory Visit: Payer: 59 | Admitting: Family Medicine

## 2023-02-25 NOTE — Telephone Encounter (Signed)
Pt was late for her OV with Dr Janee Morn on 02/25/23, I sent her a no show letter. She did say she probably did not need this appt, because she needed her labs taken before this visit. She drove all the way from [place].

## 2023-03-07 NOTE — Telephone Encounter (Signed)
Pt has appt 04/01/2023 for labs. I do not see documentation support return for lab only appointment. Please advise.  1st late arrival, fee waived, letter sent.

## 2023-03-28 ENCOUNTER — Ambulatory Visit: Payer: 59 | Admitting: Family Medicine

## 2023-04-01 ENCOUNTER — Ambulatory Visit
Admission: RE | Admit: 2023-04-01 | Discharge: 2023-04-01 | Disposition: A | Discharge status: Home / Self Care | Payer: 59 | Source: Ambulatory Visit | Attending: Family Medicine | Admitting: Family Medicine

## 2023-04-01 ENCOUNTER — Ambulatory Visit: Payer: 59 | Admitting: Family Medicine

## 2023-04-01 ENCOUNTER — Other Ambulatory Visit (INDEPENDENT_AMBULATORY_CARE_PROVIDER_SITE_OTHER): Payer: 59

## 2023-04-01 DIAGNOSIS — I1A0 Resistant hypertension: Secondary | ICD-10-CM

## 2023-04-01 DIAGNOSIS — C539 Malignant neoplasm of cervix uteri, unspecified: Secondary | ICD-10-CM

## 2023-04-01 DIAGNOSIS — Z1231 Encounter for screening mammogram for malignant neoplasm of breast: Secondary | ICD-10-CM

## 2023-04-01 DIAGNOSIS — D509 Iron deficiency anemia, unspecified: Secondary | ICD-10-CM | POA: Diagnosis not present

## 2023-04-01 DIAGNOSIS — R42 Dizziness and giddiness: Secondary | ICD-10-CM

## 2023-04-01 LAB — URINALYSIS, ROUTINE W REFLEX MICROSCOPIC
Bilirubin Urine: NEGATIVE
Hgb urine dipstick: NEGATIVE
Ketones, ur: NEGATIVE
Leukocytes,Ua: NEGATIVE
Nitrite: NEGATIVE
Specific Gravity, Urine: 1.025 (ref 1.000–1.030)
Total Protein, Urine: NEGATIVE
Urine Glucose: NEGATIVE
Urobilinogen, UA: 0.2 (ref 0.0–1.0)
pH: 6 (ref 5.0–8.0)

## 2023-04-01 LAB — COMPREHENSIVE METABOLIC PANEL
ALT: 17 U/L (ref 0–35)
AST: 19 U/L (ref 0–37)
Albumin: 4.3 g/dL (ref 3.5–5.2)
Alkaline Phosphatase: 55 U/L (ref 39–117)
BUN: 19 mg/dL (ref 6–23)
CO2: 29 mEq/L (ref 19–32)
Calcium: 9.3 mg/dL (ref 8.4–10.5)
Chloride: 102 mEq/L (ref 96–112)
Creatinine, Ser: 0.7 mg/dL (ref 0.40–1.20)
GFR: 99.76 mL/min (ref >60.00)
Glucose, Bld: 125 mg/dL — ABNORMAL HIGH (ref 70–99)
Potassium: 4.1 mEq/L (ref 3.5–5.1)
Sodium: 138 mEq/L (ref 135–145)
Total Bilirubin: 0.6 mg/dL (ref 0.2–1.2)
Total Protein: 7.3 g/dL (ref 6.0–8.3)

## 2023-04-01 LAB — FERRITIN: Ferritin: 217.9 ng/mL (ref 10.0–291.0)

## 2023-04-01 LAB — CBC WITH DIFFERENTIAL/PLATELET
Basophils Absolute: 0 10*3/uL (ref 0.0–0.1)
Basophils Relative: 0.4 % (ref 0.0–3.0)
Eosinophils Absolute: 0.1 10*3/uL (ref 0.0–0.7)
Eosinophils Relative: 2 % (ref 0.0–5.0)
HCT: 39.6 % (ref 36.0–46.0)
Hemoglobin: 13.2 g/dL (ref 12.0–15.0)
Lymphocytes Relative: 51.9 % — ABNORMAL HIGH (ref 12.0–46.0)
Lymphs Abs: 1.8 10*3/uL (ref 0.7–4.0)
MCHC: 33.3 g/dL (ref 30.0–36.0)
MCV: 89.8 fl (ref 78.0–100.0)
Monocytes Absolute: 0.3 10*3/uL (ref 0.1–1.0)
Monocytes Relative: 8.5 % (ref 3.0–12.0)
Neutro Abs: 1.3 10*3/uL — ABNORMAL LOW (ref 1.4–7.7)
Neutrophils Relative %: 37.2 % — ABNORMAL LOW (ref 43.0–77.0)
Platelets: 209 10*3/uL (ref 150.0–400.0)
RBC: 4.41 Mil/uL (ref 3.87–5.11)
RDW: 12.9 % (ref 11.5–15.5)
WBC: 3.6 10*3/uL — ABNORMAL LOW (ref 4.0–10.5)

## 2023-04-01 LAB — LIPID PANEL
Cholesterol: 177 mg/dL (ref 0–200)
HDL: 42.8 mg/dL (ref >39.00)
LDL Cholesterol: 103 mg/dL — ABNORMAL HIGH (ref 0–99)
NonHDL: 134.6
Total CHOL/HDL Ratio: 4
Triglycerides: 156 mg/dL — ABNORMAL HIGH (ref 0.0–149.0)
VLDL: 31.2 mg/dL (ref 0.0–40.0)

## 2023-04-01 LAB — MICROALBUMIN / CREATININE URINE RATIO
Creatinine,U: 143.1 mg/dL
Microalb Creat Ratio: 0.5 mg/g (ref 0.0–30.0)
Microalb, Ur: <0.7 mg/dL (ref 0.0–1.9)

## 2023-04-01 LAB — FOLATE: Folate: 12.6 ng/mL (ref >5.9)

## 2023-04-01 LAB — VITAMIN B12: Vitamin B-12: 379 pg/mL (ref 211–911)

## 2023-04-01 LAB — HEMOGLOBIN A1C: Hgb A1c MFr Bld: 6 % (ref 4.6–6.5)

## 2023-04-01 LAB — TSH: TSH: 1.59 u[IU]/mL (ref 0.35–5.50)

## 2023-04-02 LAB — IRON AND TIBC
Iron Saturation: 45 % (ref 15–55)
Iron: 122 ug/dL (ref 27–159)
Total Iron Binding Capacity: 272 ug/dL (ref 250–450)
UIBC: 150 ug/dL (ref 131–425)

## 2023-04-04 LAB — VITAMIN D 1,25 DIHYDROXY
Vitamin D 1, 25 (OH)2 Total: 29 pg/mL (ref 18–72)
Vitamin D2 1, 25 (OH)2: <8 pg/mL
Vitamin D3 1, 25 (OH)2: 29 pg/mL

## 2023-04-11 ENCOUNTER — Telehealth: Payer: Self-pay | Admitting: Family Medicine

## 2023-04-11 NOTE — Telephone Encounter (Signed)
Pt would like for you to call her to go over a lab results

## 2023-04-11 NOTE — Telephone Encounter (Signed)
Pt is requesting a call back about her results from 6/17.

## 2023-04-11 NOTE — Telephone Encounter (Signed)
Tried calling pt regarding lab annotation below, but there was no answer. Pt needs to schedule an appointment to discuss further management per lab results below.       The following abnormalities are noted:   Elevated cholesterol and blood sugar.  Recommend following up at previously scheduled visit to discuss further management.

## 2023-04-11 NOTE — Telephone Encounter (Signed)
Pt would like for you to call her to go over her lab results

## 2023-04-11 NOTE — Telephone Encounter (Signed)
Tried calling pt regarding lab annotation below, but there was no answer. Pt needs to schedule an appointment to discuss further management per lab results below.       The following abnormalities are noted:   Elevated cholesterol and blood sugar.  Recommend following up at previously scheduled visit to discuss further management. 

## 2023-04-12 NOTE — Telephone Encounter (Signed)
Tried calling patient, but there was no answer.

## 2023-04-12 NOTE — Telephone Encounter (Signed)
Spoke with pt and went over lab results and recommendations. Pt stated that it's hard for her to come in for a follow up OV due to her working in [place]. She inquired if the visit to discuss further management could be virtual? I advised her that you are out of the office today and it may be next week until you respond. She verbalized understanding. Please advise.

## 2023-04-15 NOTE — Telephone Encounter (Signed)
Tried calling pt to schedule mychart video visit to discuss further management , but was unable to reach pt. Sending a [employer].

## 2023-04-16 ENCOUNTER — Telehealth: Payer: Self-pay

## 2023-04-16 ENCOUNTER — Telehealth (INDEPENDENT_AMBULATORY_CARE_PROVIDER_SITE_OTHER): Payer: 59 | Admitting: Family Medicine

## 2023-04-16 VITALS — BP 134/78

## 2023-04-16 DIAGNOSIS — I5032 Chronic diastolic (congestive) heart failure: Secondary | ICD-10-CM

## 2023-04-16 DIAGNOSIS — I1A0 Resistant hypertension: Secondary | ICD-10-CM

## 2023-04-16 DIAGNOSIS — R7303 Prediabetes: Secondary | ICD-10-CM

## 2023-04-16 DIAGNOSIS — D509 Iron deficiency anemia, unspecified: Secondary | ICD-10-CM | POA: Diagnosis not present

## 2023-04-16 DIAGNOSIS — E785 Hyperlipidemia, unspecified: Secondary | ICD-10-CM

## 2023-04-16 DIAGNOSIS — I110 Hypertensive heart disease with heart failure: Secondary | ICD-10-CM

## 2023-04-16 HISTORY — DX: Hyperlipidemia, unspecified: E78.5

## 2023-04-16 MED ORDER — ATORVASTATIN CALCIUM 40 MG PO TABS
40.0000 mg | ORAL_TABLET | Freq: Every day | ORAL | 3 refills | Status: DC
Start: 2023-04-16 — End: 2023-08-23

## 2023-04-16 NOTE — Telephone Encounter (Signed)
-----   Message from Garnette Gunner, MD sent at 04/16/2023 10:12 AM EDT ----- Patient insurance preference was for atorvastatin 40 mg over rosuvastatin 20 mg.  This was the cheaper option.  I sent in for atorvastatin as it is equivalent to the rosuvastatin.

## 2023-04-16 NOTE — Assessment & Plan Note (Signed)
Initiate atorvastatin 40 mg daily to manage elevated LDL levels and reduce cardiovascular risk. Recheck lipid panel in 4 months.

## 2023-04-16 NOTE — Assessment & Plan Note (Signed)
Blood pressure is currently stable. Continue the current regimen: amlodipine, losartan, metoprolol, and spironolactone. Encourage continued home monitoring of blood pressure, report significant changes.

## 2023-04-16 NOTE — Assessment & Plan Note (Signed)
No current anemia based on recent lab results. Continue monitoring hemoglobin and hematocrit as part of routine follow-up.

## 2023-04-16 NOTE — Progress Notes (Signed)
Virtual Visit via Video Note  I connected with Kayla Carey on 04/16/2023 at 10:00 AM EDT by a video enabled telemedicine application and verified that I am speaking with the correct person using two identifiers.  Location: Patient: home Provider: office   I discussed the limitations of evaluation and management by telemedicine and the availability of in person appointments. The patient expressed understanding and agreed to proceed.  History of Present Illness:  Chief Complaint: Follow-up visit for management of chronic conditions, including hypertensive heart disease, resistant hypertension, and elevated cholesterol.  History of Present Illness:  Hypertension and Hypertensive Heart Disease. The patient continues to be managed on amlodipine 10 mg daily, losartan 100 mg daily, metoprolol tartrate 50 mg twice daily, and spironolactone 50 mg twice daily. They deny any new chest pain or shortness of breath since the last visit. Blood pressure readings at home have been consistent with a prior reading of 134/78 mmHg.  Elevated Cholesterol. Recent lab results indicate elevated LDL cholesterol levels at 161 mg/dL. The patient has not previously been on a cholesterol-lowering medication.   Diabetes/Prediabetes. The patient's hemoglobin A1C was recorded at 6.0%. The patient continues to take metformin 750 mg extended release daily. Given the recent trend, repeat hemoglobin A1C and fasting glucose will be performed at the next visit.  Iron Deficiency Anemia. The patient reports not currently taking ferrous sulfate. Recent labs show hemoglobin at 13.2 g/dL, indicating normal levels.   Observations/Objective: Vitals:   04/16/23 1258  BP: 134/78     Gen: NAD, resting comfortably HEENT: EOMI Pulm: NWOB Skin: no rash on face Neuro: no facial asymmetry or dysmetria Psych: Normal affect   Assessment and Plan: Problem List Items Addressed This Visit       Cardiovascular and  Mediastinum   Hypertensive heart disease - Primary    Blood pressure is currently stable. Continue the current regimen: amlodipine, losartan, metoprolol, and spironolactone. Encourage continued home monitoring of blood pressure, report significant changes.      Relevant Medications   atorvastatin (LIPITOR) 40 MG tablet   Hypertension   Relevant Medications   atorvastatin (LIPITOR) 40 MG tablet   Chronic diastolic (congestive) heart failure (HCC)   Relevant Medications   atorvastatin (LIPITOR) 40 MG tablet     Other   IDA (iron deficiency anemia)    No current anemia based on recent lab results. Continue monitoring hemoglobin and hematocrit as part of routine follow-up.      Prediabetes    The patient will continue on metformin 750 mg extended release. Repeat hemoglobin A1C in 4 months before next visit      Relevant Orders   Hemoglobin A1C   Hyperlipidemia    Initiate atorvastatin 40 mg daily to manage elevated LDL levels and reduce cardiovascular risk. Recheck lipid panel in 4 months.      Relevant Medications   atorvastatin (LIPITOR) 40 MG tablet   Other Relevant Orders   Lipid Profile   Hepatic function panel   Encourage adherence to a low-salt, low-fat diet, regular exercise. Follow-up with cardiology as scheduled. Next follow-up visit in 4 months, with additional follow-up post-cardiology appointment. Education and reassurance provided today, patient aware to contact the office if symptoms or concerns arise before the next visit.  There are no discontinued medications.   Follow Up Instructions: Return in about 4 months (around 08/17/2023) for BP, HLD.    I discussed the assessment and treatment plan with the patient. The patient was provided an opportunity to ask questions and  all were answered. The patient agreed with the plan and demonstrated an understanding of the instructions.   The patient was advised to call back or seek an in-person evaluation if the  symptoms worsen or if the condition fails to improve as anticipated.  Garnette Gunner, MD

## 2023-04-16 NOTE — Assessment & Plan Note (Signed)
The patient will continue on metformin 750 mg extended release. Repeat hemoglobin A1C in 4 months before next visit

## 2023-06-10 ENCOUNTER — Other Ambulatory Visit: Payer: Self-pay | Admitting: Family Medicine

## 2023-06-10 DIAGNOSIS — I1A0 Resistant hypertension: Secondary | ICD-10-CM

## 2023-06-10 DIAGNOSIS — R7303 Prediabetes: Secondary | ICD-10-CM

## 2023-06-10 DIAGNOSIS — D509 Iron deficiency anemia, unspecified: Secondary | ICD-10-CM

## 2023-06-10 MED ORDER — METFORMIN HCL ER 750 MG PO TB24
750.0000 mg | ORAL_TABLET | Freq: Every evening | ORAL | 0 refills | Status: DC
Start: 2023-06-10 — End: 2023-08-23

## 2023-06-10 MED ORDER — METOPROLOL TARTRATE 50 MG PO TABS
50.0000 mg | ORAL_TABLET | Freq: Two times a day (BID) | ORAL | 0 refills | Status: DC
Start: 2023-06-10 — End: 2023-08-23

## 2023-06-10 MED ORDER — LOSARTAN POTASSIUM 100 MG PO TABS
100.0000 mg | ORAL_TABLET | Freq: Every day | ORAL | 0 refills | Status: DC
Start: 2023-06-10 — End: 2023-08-23

## 2023-06-10 MED ORDER — SPIRONOLACTONE 50 MG PO TABS
50.0000 mg | ORAL_TABLET | Freq: Two times a day (BID) | ORAL | 0 refills | Status: DC
Start: 2023-06-10 — End: 2023-08-23

## 2023-06-10 MED ORDER — AMLODIPINE BESYLATE 10 MG PO TABS
10.0000 mg | ORAL_TABLET | Freq: Every day | ORAL | 0 refills | Status: DC
Start: 2023-06-10 — End: 2023-08-23

## 2023-06-10 MED ORDER — FERROUS SULFATE 325 (65 FE) MG PO TABS
325.0000 mg | ORAL_TABLET | Freq: Every day | ORAL | 0 refills | Status: DC
Start: 2023-06-10 — End: 2024-06-16

## 2023-06-10 NOTE — Telephone Encounter (Signed)
Chart supports rx. Last OV: 04/16/2023

## 2023-06-11 ENCOUNTER — Other Ambulatory Visit: Payer: Self-pay | Admitting: Family Medicine

## 2023-06-11 DIAGNOSIS — D509 Iron deficiency anemia, unspecified: Secondary | ICD-10-CM

## 2023-06-11 DIAGNOSIS — R7303 Prediabetes: Secondary | ICD-10-CM

## 2023-06-11 DIAGNOSIS — I1A0 Resistant hypertension: Secondary | ICD-10-CM

## 2023-08-23 ENCOUNTER — Other Ambulatory Visit (INDEPENDENT_AMBULATORY_CARE_PROVIDER_SITE_OTHER): Payer: No Typology Code available for payment source

## 2023-08-23 ENCOUNTER — Telehealth: Payer: Self-pay

## 2023-08-23 ENCOUNTER — Telehealth: Payer: Self-pay | Admitting: Family Medicine

## 2023-08-23 DIAGNOSIS — I1A0 Resistant hypertension: Secondary | ICD-10-CM

## 2023-08-23 DIAGNOSIS — R7303 Prediabetes: Secondary | ICD-10-CM

## 2023-08-23 DIAGNOSIS — E785 Hyperlipidemia, unspecified: Secondary | ICD-10-CM

## 2023-08-23 DIAGNOSIS — R42 Dizziness and giddiness: Secondary | ICD-10-CM

## 2023-08-23 DIAGNOSIS — H8120 Vestibular neuronitis, unspecified ear: Secondary | ICD-10-CM

## 2023-08-23 LAB — HEPATIC FUNCTION PANEL
ALT: 19 U/L (ref 0–35)
AST: 17 U/L (ref 0–37)
Albumin: 4.5 g/dL (ref 3.5–5.2)
Alkaline Phosphatase: 53 U/L (ref 39–117)
Bilirubin, Direct: 0.1 mg/dL (ref 0.0–0.3)
Total Bilirubin: 0.4 mg/dL (ref 0.2–1.2)
Total Protein: 7.3 g/dL (ref 6.0–8.3)

## 2023-08-23 LAB — LIPID PANEL
Cholesterol: 188 mg/dL (ref 0–200)
HDL: 47.6 mg/dL (ref >39.00)
LDL Cholesterol: 124 mg/dL — ABNORMAL HIGH (ref 0–99)
NonHDL: 140.1
Total CHOL/HDL Ratio: 4
Triglycerides: 79 mg/dL (ref 0.0–149.0)
VLDL: 15.8 mg/dL (ref 0.0–40.0)

## 2023-08-23 MED ORDER — METFORMIN HCL ER 750 MG PO TB24: 750.0000 mg | ORAL_TABLET | Freq: Every evening | ORAL | 0 refills | Status: DC

## 2023-08-23 MED ORDER — METOPROLOL TARTRATE 50 MG PO TABS: 50.0000 mg | ORAL_TABLET | Freq: Two times a day (BID) | ORAL | 0 refills | Status: DC

## 2023-08-23 MED ORDER — AMLODIPINE BESYLATE 10 MG PO TABS
10.0000 mg | ORAL_TABLET | Freq: Every day | ORAL | 0 refills | Status: DC
Start: 2023-08-23 — End: 2023-12-09

## 2023-08-23 MED ORDER — MECLIZINE HCL 25 MG PO TABS: 25.0000 mg | ORAL_TABLET | Freq: Three times a day (TID) | ORAL | 2 refills | Status: DC | PRN

## 2023-08-23 MED ORDER — SPIRONOLACTONE 50 MG PO TABS: 50.0000 mg | ORAL_TABLET | Freq: Two times a day (BID) | ORAL | 0 refills | Status: DC

## 2023-08-23 MED ORDER — LOSARTAN POTASSIUM 100 MG PO TABS
100.0000 mg | ORAL_TABLET | Freq: Every day | ORAL | 0 refills | Status: DC
Start: 2023-08-23 — End: 2023-12-09

## 2023-08-23 MED ORDER — ATORVASTATIN CALCIUM 40 MG PO TABS
40.0000 mg | ORAL_TABLET | Freq: Every day | ORAL | 3 refills | Status: AC
Start: 2023-08-23 — End: 2024-08-17

## 2023-08-23 NOTE — Telephone Encounter (Signed)
Rx refills sent to pharmacy. Pt needs appt for further refills

## 2023-08-23 NOTE — Telephone Encounter (Signed)
Kayla Carey , Phlebotomist, stated that they didn't draw A1C due to the printer being out of paper. I advised her she can contact the patient and see if they can schedule a lab visit for recollection. She verbalized understanding. Hemoglobin A1c placed in future.

## 2023-08-23 NOTE — Telephone Encounter (Signed)
Prescription Request  08/23/2023  LOV: 01/25/2023  What is the name of the medication or equipment? amLODipine ,atorvastatin ,losartan ,meclizine ,metFORMIN ,metoprolol  and spironolactone   Have you contacted your pharmacy to request a refill? No   Which pharmacy would you like this sent to?  [facility] [address] ([address]), [place] - [address] [place]) [place] [address] Phone: [phone] Fax: [phone]    Patient notified that their request is being sent to the clinical staff for review and that they should receive a response within 2 business days.   Please advise at Mobile [phone] (mobile)

## 2023-10-15 ENCOUNTER — Other Ambulatory Visit (INDEPENDENT_AMBULATORY_CARE_PROVIDER_SITE_OTHER): Payer: No Typology Code available for payment source

## 2023-10-15 DIAGNOSIS — R7303 Prediabetes: Secondary | ICD-10-CM | POA: Diagnosis not present

## 2023-10-15 LAB — HEMOGLOBIN A1C: Hgb A1c MFr Bld: 6.3 % (ref 4.6–6.5)

## 2023-10-18 ENCOUNTER — Telehealth: Payer: No Typology Code available for payment source | Admitting: Family Medicine

## 2023-10-18 DIAGNOSIS — E119 Type 2 diabetes mellitus without complications: Secondary | ICD-10-CM | POA: Diagnosis not present

## 2023-10-18 NOTE — Progress Notes (Signed)
 Virtual Visit Consent   Kayla Carey, you are scheduled for a virtual visit with a [facility] provider today. Just as with appointments in the office, your consent must be obtained to participate. Your consent will be active for this visit and any virtual visit you may have with one of our providers in the next 365 days. If you have a MyChart account, a copy of this consent can be sent to you electronically.  As this is a virtual visit, video technology does not allow for your provider to perform a traditional examination. This may limit your provider's ability to fully assess your condition. If your provider identifies any concerns that need to be evaluated in person or the need to arrange testing (such as labs, EKG, etc.), we will make arrangements to do so. Although advances in technology are sophisticated, we cannot ensure that it will always work on either your end or our end. If the connection with a video visit is poor, the visit may have to be switched to a telephone visit. With either a video or telephone visit, we are not always able to ensure that we have a secure connection.  By engaging in this virtual visit, you consent to the provision of healthcare and authorize for your insurance to be billed (if applicable) for the services provided during this visit. Depending on your insurance coverage, you may receive a charge related to this service.  I need to obtain your verbal consent now. Are you willing to proceed with your visit today? Kayla Carey has provided verbal consent on 10/18/2023 for a virtual visit (video or telephone). Kayla Lamp, FNP  Date: 10/18/2023 4:48 PM  Virtual Visit via Video Note   I, Kayla Carey, connected with  Kayla Carey  ([id number], Feb 25, 1971) on 10/18/23 at  4:45 PM EST by a video-enabled telemedicine application and verified that I am speaking with the correct person using two identifiers.  Location: Patient:  Provider: Virtual Visit Location  Provider: Home Office   I discussed the limitations of evaluation and management by telemedicine and the availability of in person appointments. The patient expressed understanding and agreed to proceed.    History of Present Illness: Kayla Carey is a 53 y.o. who identifies as a female who was assigned female at birth, and is being seen today for concerns about weight and diabetes. She says her A1c was 5.6 now 6.3 climbing a little. She would like to start mounjaro. She does not have upcoming appmt with her pcp .  HPI: HPI  Problems:  Patient Active Problem List   Diagnosis Date Noted   Hyperlipidemia 04/16/2023   Vertigo 01/30/2023   Prediabetes 01/30/2023   Hypertension    Chronic diastolic (congestive) heart failure (HCC)    Malignant neoplasm of cervix (HCC)    Hypertensive heart disease 08/01/2018   GERD (gastroesophageal reflux disease) 08/01/2018   IDA (iron deficiency anemia) 08/01/2018    Allergies: No Known Allergies Medications:  Current Outpatient Medications:    amLODipine  (NORVASC ) 10 MG tablet, Take 1 tablet (10 mg total) by mouth daily., Disp: 90 tablet, Rfl: 0   Ascorbic Acid (VITAMIN C) 1000 MG tablet, Take 1,000 mg by mouth daily. , Disp: , Rfl:    aspirin  EC 81 MG tablet, Take 81 mg by mouth daily., Disp: , Rfl:    atorvastatin  (LIPITOR) 40 MG tablet, Take 1 tablet (40 mg total) by mouth daily., Disp: 90 tablet, Rfl: 3   Cholecalciferol (VITAMIN D3) 2000 units capsule, Take 2,000  Units by mouth daily., Disp: , Rfl:    ferrous sulfate  325 (65 FE) MG tablet, Take 1 tablet (325 mg total) by mouth daily with breakfast., Disp: 90 tablet, Rfl: 0   losartan  (COZAAR ) 100 MG tablet, Take 1 tablet (100 mg total) by mouth daily., Disp: 90 tablet, Rfl: 0   meclizine  (ANTIVERT ) 25 MG tablet, Take 1 tablet (25 mg total) by mouth 3 (three) times daily as needed for dizziness., Disp: 60 tablet, Rfl: 2   metFORMIN  (GLUCOPHAGE -XR) 750 MG 24 hr tablet, Take 1 tablet (750 mg  total) by mouth every evening., Disp: 90 tablet, Rfl: 0   metoprolol  tartrate (LOPRESSOR ) 50 MG tablet, Take 1 tablet (50 mg total) by mouth 2 (two) times daily., Disp: 180 tablet, Rfl: 0   spironolactone  (ALDACTONE ) 50 MG tablet, Take 1 tablet (50 mg total) by mouth 2 (two) times daily., Disp: 180 tablet, Rfl: 0  Observations/Objective: Patient is well-developed, well-nourished in no acute distress.  Resting comfortably  at home.  Head is normocephalic, atraumatic.  No labored breathing.  Speech is clear and coherent with logical content.  Patient is alert and oriented at baseline.    Assessment and Plan: 1. Diabetes mellitus without complication (HCC) (Primary)  2. Morbid obesity (HCC)  Pt is to contact her pcp Monday for options regarding starting injections for diabetes and weight loss. She is interested in [place].   Follow Up Instructions: I discussed the assessment and treatment plan with the patient. The patient was provided an opportunity to ask questions and all were answered. The patient agreed with the plan and demonstrated an understanding of the instructions.  A copy of instructions were sent to the patient via MyChart unless otherwise noted below.     The patient was advised to call back or seek an in-person evaluation if the symptoms worsen or if the condition fails to improve as anticipated.    Camryn Quesinberry, FNP

## 2023-10-18 NOTE — Patient Instructions (Signed)
Diabetes Mellitus Basics  Diabetes mellitus, or diabetes, is a long-term (chronic) disease. It occurs when the body does not properly use sugar (glucose) that is released from food after you eat. Diabetes mellitus may be caused by one or both of these problems: Your pancreas does not make enough of a hormone called insulin. Your body does not react in a normal way to the insulin that it makes. Insulin lets glucose enter cells in your body. This gives you energy. If you have diabetes, glucose cannot get into cells. This causes high blood glucose (hyperglycemia). How to treat and manage diabetes You may need to take insulin or other diabetes medicines daily to keep your glucose in balance. If you are prescribed insulin, you will learn how to give yourself insulin by injection. You may need to adjust the amount of insulin you take based on the foods that you eat. You will need to check your blood glucose levels using a glucose monitor as told by your health care provider. The readings can help determine if you have low or high blood glucose. Generally, you should have these blood glucose levels: Before meals (preprandial): 80-130 mg/dL (4.4-7.2 mmol/L). After meals (postprandial): below 180 mg/dL (10 mmol/L). Hemoglobin A1c (HbA1c) level: less than 7%. Your health care provider will set treatment goals for you. Keep all follow-up visits. This is important. Follow these instructions at home: Diabetes medicines Take your diabetes medicines every day as told by your health care provider. List your diabetes medicines here: Name of medicine: ______________________________ Amount (dose): _______________ Time (a.m./p.m.): _______________ Notes: ___________________________________ Name of medicine: ______________________________ Amount (dose): _______________ Time (a.m./p.m.): _______________ Notes: ___________________________________ Name of medicine: ______________________________ Amount (dose):  _______________ Time (a.m./p.m.): _______________ Notes: ___________________________________ Insulin If you use insulin, list the types of insulin you use here: Insulin type: ______________________________ Amount (dose): _______________ Time (a.m./p.m.): _______________Notes: ___________________________________ Insulin type: ______________________________ Amount (dose): _______________ Time (a.m./p.m.): _______________ Notes: ___________________________________ Insulin type: ______________________________ Amount (dose): _______________ Time (a.m./p.m.): _______________ Notes: ___________________________________ Insulin type: ______________________________ Amount (dose): _______________ Time (a.m./p.m.): _______________ Notes: ___________________________________ Insulin type: ______________________________ Amount (dose): _______________ Time (a.m./p.m.): _______________ Notes: ___________________________________ Managing blood glucose  Check your blood glucose levels using a glucose monitor as told by your health care provider. Write down the times that you check your glucose levels here: Time: _______________ Notes: ___________________________________ Time: _______________ Notes: ___________________________________ Time: _______________ Notes: ___________________________________ Time: _______________ Notes: ___________________________________ Time: _______________ Notes: ___________________________________ Time: _______________ Notes: ___________________________________  Low blood glucose Low blood glucose (hypoglycemia) is when glucose is at or below 70 mg/dL (3.9 mmol/L). Symptoms may include: Feeling: Hungry. Sweaty and clammy. Irritable or easily upset. Dizzy. Sleepy. Having: A fast heartbeat. A headache. A change in your vision. Numbness around the mouth, lips, or tongue. Having trouble with: Moving (coordination). Sleeping. Treating low blood glucose To treat low blood  glucose, eat or drink something containing sugar right away. If you can think clearly and swallow safely, follow the 15:15 rule: Take 15 grams of a fast-acting carb (carbohydrate), as told by your health care provider. Some fast-acting carbs are: Glucose tablets: take 3-4 tablets. Hard candy: eat 3-5 pieces. Fruit juice: drink 4 oz (120 mL). Regular (not diet) soda: drink 4-6 oz (120-180 mL). Honey or sugar: eat 1 Tbsp (15 mL). Check your blood glucose levels 15 minutes after you take the carb. If your glucose is still at or below 70 mg/dL (3.9 mmol/L), take 15 grams of a carb again. If your glucose does not go above 70 mg/dL (3.9 mmol/L) after   3 tries, get help right away. After your glucose goes back to normal, eat a meal or a snack within 1 hour. Treating very low blood glucose If your glucose is at or below 54 mg/dL (3 mmol/L), you have very low blood glucose (severe hypoglycemia). This is an emergency. Do not wait to see if the symptoms will go away. Get medical help right away. Call your local emergency services (911 in the [place] Do not drive yourself to the hospital. Questions to ask your health care provider Should I talk with a diabetes educator? What equipment will I need to care for myself at home? What diabetes medicines do I need? When should I take them? How often do I need to check my blood glucose levels? What number can I call if I have questions? When is my follow-up visit? Where can I find a support group for people with diabetes? Where to find more information American Diabetes Association: www.diabetes.org Association of Diabetes Care and Education Specialists: www.diabeteseducator.org Contact a health care provider if: Your blood glucose is at or above 240 mg/dL (13.3 mmol/L) for 2 days in a row. You have been sick or have had a fever for 2 days or more, and you are not getting better. You have any of these problems for more than 6 hours: You cannot eat or  drink. You feel nauseous. You vomit. You have diarrhea. Get help right away if: Your blood glucose is lower than 54 mg/dL (3 mmol/L). You get confused. You have trouble thinking clearly. You have trouble breathing. These symptoms may represent a serious problem that is an emergency. Do not wait to see if the symptoms will go away. Get medical help right away. Call your local emergency services (911 in the [place] Do not drive yourself to the hospital. Summary Diabetes mellitus is a chronic disease that occurs when the body does not properly use sugar (glucose) that is released from food after you eat. Take insulin and diabetes medicines as told. Check your blood glucose every day, as often as told. Keep all follow-up visits. This is important. This information is not intended to replace advice given to you by your health care provider. Make sure you discuss any questions you have with your health care provider. Document Revised: 02/02/2020 Document Reviewed: 02/02/2020 Elsevier Patient Education  2024 [employer]  

## 2023-12-09 ENCOUNTER — Other Ambulatory Visit: Payer: Self-pay | Admitting: Family Medicine

## 2023-12-09 DIAGNOSIS — R7303 Prediabetes: Secondary | ICD-10-CM

## 2023-12-09 DIAGNOSIS — I1A0 Resistant hypertension: Secondary | ICD-10-CM

## 2023-12-09 MED ORDER — METOPROLOL TARTRATE 50 MG PO TABS: 50.0000 mg | ORAL_TABLET | Freq: Two times a day (BID) | ORAL | 0 refills | Status: DC

## 2023-12-09 MED ORDER — LOSARTAN POTASSIUM 100 MG PO TABS: 100.0000 mg | ORAL_TABLET | Freq: Every day | ORAL | 0 refills | Status: DC

## 2023-12-09 MED ORDER — AMLODIPINE BESYLATE 10 MG PO TABS: 10.0000 mg | ORAL_TABLET | Freq: Every day | ORAL | 0 refills | Status: DC

## 2023-12-09 MED ORDER — METFORMIN HCL ER 750 MG PO TB24: 750.0000 mg | ORAL_TABLET | Freq: Every evening | ORAL | 0 refills | Status: DC

## 2023-12-09 MED ORDER — SPIRONOLACTONE 50 MG PO TABS: 50.0000 mg | ORAL_TABLET | Freq: Two times a day (BID) | ORAL | 0 refills | Status: DC

## 2023-12-31 ENCOUNTER — Encounter: Payer: No Typology Code available for payment source | Admitting: Family Medicine

## 2023-12-31 ENCOUNTER — Telehealth: Payer: Self-pay | Admitting: Family Medicine

## 2024-01-17 NOTE — Telephone Encounter (Signed)
 12/31/2023 late arrival (10:51a for 10:40a appt)

## 2024-01-20 ENCOUNTER — Other Ambulatory Visit: Payer: Self-pay | Admitting: Family Medicine

## 2024-01-20 DIAGNOSIS — I1A0 Resistant hypertension: Secondary | ICD-10-CM

## 2024-01-20 DIAGNOSIS — R7303 Prediabetes: Secondary | ICD-10-CM

## 2024-01-20 MED ORDER — SPIRONOLACTONE 50 MG PO TABS: 50.0000 mg | ORAL_TABLET | Freq: Two times a day (BID) | ORAL | 0 refills | Status: DC

## 2024-01-20 MED ORDER — METFORMIN HCL ER 750 MG PO TB24: 750.0000 mg | ORAL_TABLET | Freq: Every evening | ORAL | 0 refills | Status: DC

## 2024-01-20 MED ORDER — METOPROLOL TARTRATE 50 MG PO TABS: 50.0000 mg | ORAL_TABLET | Freq: Two times a day (BID) | ORAL | 0 refills | Status: DC

## 2024-01-20 MED ORDER — AMLODIPINE BESYLATE 10 MG PO TABS: 10.0000 mg | ORAL_TABLET | Freq: Every day | ORAL | 0 refills | Status: DC

## 2024-01-20 MED ORDER — LOSARTAN POTASSIUM 100 MG PO TABS: 100.0000 mg | ORAL_TABLET | Freq: Every day | ORAL | 0 refills | Status: DC

## 2024-02-21 ENCOUNTER — Encounter: Payer: Self-pay | Admitting: Family Medicine

## 2024-02-21 ENCOUNTER — Ambulatory Visit: Admitting: Family Medicine

## 2024-02-21 VITALS — BP 116/73 | HR 80 | Temp 97.0°F | Resp 18 | Wt 282.2 lb

## 2024-02-21 DIAGNOSIS — I5032 Chronic diastolic (congestive) heart failure: Secondary | ICD-10-CM | POA: Diagnosis not present

## 2024-02-21 DIAGNOSIS — R7303 Prediabetes: Secondary | ICD-10-CM

## 2024-02-21 DIAGNOSIS — E782 Mixed hyperlipidemia: Secondary | ICD-10-CM

## 2024-02-21 DIAGNOSIS — E66813 Obesity, class 3: Secondary | ICD-10-CM | POA: Diagnosis not present

## 2024-02-21 DIAGNOSIS — E669 Obesity, unspecified: Secondary | ICD-10-CM

## 2024-02-21 DIAGNOSIS — Z6841 Body Mass Index (BMI) 40.0 and over, adult: Secondary | ICD-10-CM

## 2024-02-21 DIAGNOSIS — D500 Iron deficiency anemia secondary to blood loss (chronic): Secondary | ICD-10-CM

## 2024-02-21 HISTORY — DX: Obesity, unspecified: E66.9

## 2024-02-21 LAB — POCT GLYCOSYLATED HEMOGLOBIN (HGB A1C): Hemoglobin A1C: 5.7 % — AB (ref 4.0–5.6)

## 2024-02-21 MED ORDER — WEGOVY 0.25 MG/0.5ML ~~LOC~~ SOAJ: 0.2500 mg | SUBCUTANEOUS | 0 refills | Status: DC

## 2024-02-21 NOTE — Progress Notes (Signed)
 Assessment & Plan   Assessment/Plan:   Assessment & Plan Hypertensive heart failure Blood pressure is well-controlled at 116/73 mmHg. Current medications include amlodipine , spironolactone , metoprolol , and losartan  as part of goal-directed therapy. Discussed the potential benefit of Wegovy for cardiac prevention and weight management, which may reduce the burden of heart failure and mortality from heart disease. Insurance coverage is more likely when prescribed for heart disease prevention. - Continue amlodipine  10 mg daily - Continue spironolactone  50 mg twice daily - Continue metoprolol  50 mg twice daily - Continue losartan  100 mg daily - Consider Wegovy for cardiac prevention and weight management - Provide a note for three days off work due to fatigue and leg swelling  Prediabetes A1c has improved to 5.7%, indicating good control. Current management includes lifestyle modifications and metformin . Discussed transitioning from metformin  to [facility], which can further aid in blood sugar control and potentially move her out of the prediabetic range. - Discontinue metformin  - Initiate Wegovy at 0.25 mg weekly - Monitor blood sugar levels  Obesity She is actively engaged in weight management through diet and exercise, with a weight decrease of four pounds. Discussed the use of Wegovy for weight management, emphasizing gradual dose escalation to minimize side effects such as nausea and vomiting. Kayla Carey is administered as a weekly injection. - Initiate Wegovy at 0.25 mg weekly - Refer to a dietitian for structured nutrition support - Encourage continued physical activity and dietary modifications  Iron deficiency anemia She continues to take iron supplements. - Continue iron supplementation      There are no discontinued medications.  Return in about 1 month (around 03/23/2024) for weight management.        Subjective:   Encounter date: 02/21/2024  Kayla Carey is a 53  y.o. female who has Hypertensive heart disease; GERD (gastroesophageal reflux disease); IDA (iron deficiency anemia); Malignant neoplasm of cervix (HCC); Hypertension; Chronic diastolic (congestive) heart failure (HCC); Vertigo; Prediabetes; Hyperlipidemia; and Class 3 severe obesity due to excess calories with serious comorbidity and body mass index (BMI) of 40.0 to 44.9 in adult on their problem list..   She  has a past medical history of Abnormal electrocardiogram (ECG) (EKG) (08/11/2018), Abnormal myocardial perfusion study (08/11/2018), Cervical cancer (HCC), Chest pain (08/01/2018), Chronic diastolic (congestive) heart failure (HCC), GERD (gastroesophageal reflux disease), Hypertension (2012), Hypertensive heart disease (08/01/2018), IDA (iron deficiency anemia), and Malignant neoplasm of cervix (HCC)..   She presents with chief complaint of Medical Management of Chronic Issues (Pt is requesting A1c check and discuss weight management options //HM due- colonoscopy and vaccinations( Pt declined vaccine for today) ) .   Discussed the use of AI scribe software for clinical note transcription with the patient, who gave verbal consent to proceed.  History of Present Illness Kayla Carey is a 53 year old female with hypertension and prediabetes who presents for blood sugar and weight management.  She is concerned about her A1c levels and weight management. Despite efforts to lose weight, including a diet rich in vegetables, fruits, and lean proteins, she finds it challenging. Her diet includes fish, chicken, and occasionally toast with fish or egg in the morning. 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 She engages in physical activity by biking for about 30 minutes a day, both before work and after returning from her night shift. Despite  these efforts, she is worried about her weight and its impact on her health.  Her current medications include metformin  750 mg extended release once a day for blood sugar management, amlodipine  10 mg, spironolactone  50 mg twice a day, metoprolol  50 mg twice a day, losartan  100 mg once a day for hypertension and heart failure management, aspirin , a statin at 40 mg daily, iron supplements, and vitamin D12. She has a history of iron deficiency. No history of liver disease, pancreatic disease, gastroparesis, or thyroid cancer in herself or her family. She uses chia seeds in her smoothies, which she believes help her health.  She feels burnt out from working seven days a week and requests time off due to fatigue and swelling in her legs, which she attributes to her heart condition.     ROS  Past Surgical History:  Procedure Laterality Date   ABDOMINAL HYSTERECTOMY  2019   In process of the hysterectomy, the right ovary was removed.   BREAST BIOPSY Left    over [place]- benign   BREAST SURGERY Left age 72   Lumpectomy-benign   CERVICAL CONIZATION W/BX N/A 08/25/2018   Procedure: CONIZATION CERVIX WITH BIOPSY AND ENDOCERVICAL CURETTAGE;  Surgeon: Lyn Sanders, MD;  Location: [facility];  Service: Gynecology;  Laterality: N/A;   LEFT HEART CATH AND CORONARY ANGIOGRAPHY N/A 08/13/2018   Procedure: LEFT HEART CATH AND CORONARY ANGIOGRAPHY;  Surgeon: Millicent Ally, MD;  Location: [facility];  Service: Cardiovascular;  Laterality: N/A;    Outpatient Medications Prior to Visit  Medication Sig Dispense Refill   amLODipine  (NORVASC ) 10 MG tablet Take 1 tablet (10 mg total) by mouth daily. 30 tablet 0   Ascorbic Acid (VITAMIN C) 1000 MG tablet Take 1,000 mg by mouth daily.      aspirin  EC 81 MG tablet Take 81 mg by mouth daily.     atorvastatin  (LIPITOR) 40 MG tablet Take 1 tablet (40 mg total) by mouth daily. 90 tablet 3   Cholecalciferol (VITAMIN  D3) 2000 units capsule Take 2,000 Units by mouth daily.     losartan  (COZAAR ) 100 MG tablet Take 1 tablet (100 mg total) by mouth daily. 30 tablet 0   meclizine  (ANTIVERT ) 25 MG tablet Take 1 tablet (25 mg total) by mouth 3 (three) times daily as needed for dizziness. 60 tablet 2   Potassium Chloride  ER 20 MEQ TBCR 1 tab(s) orally Once daily for 30 days     ferrous sulfate  325 (65 FE) MG tablet Take 1 tablet (325 mg total) by mouth daily with breakfast. 90 tablet 0   metFORMIN  (GLUCOPHAGE -XR) 750 MG 24 hr tablet Take 1 tablet (750 mg total) by mouth every evening. 30 tablet 0   metoprolol  tartrate (LOPRESSOR ) 50 MG tablet Take 1 tablet (50 mg total) by mouth 2 (two) times daily. 60 tablet 0   spironolactone  (ALDACTONE ) 50 MG tablet Take 1 tablet (50 mg total) by mouth 2 (two) times daily. 60 tablet 0   No facility-administered medications prior to visit.    Family History  Problem Relation Age of Onset   Hypertension Mother    Congestive Heart Failure Mother    Hypertension Father    Diabetes Father    Cancer Father        Prostate   Kidney cancer Son        son died from disease    Social History   Socioeconomic History   Marital  status: Single    Spouse name: Not on file   Number of children: Not on file   Years of education: Not on file   Highest education level: Associate degree: occupational, technical, or vocational program  Occupational History   Not on file  Tobacco Use   Smoking status: Former    Current packs/day: 0.00    Average packs/day: 0.5 packs/day for 20.0 years (10.0 ttl pk-yrs)    Types: Cigarettes    Start date: 07/15/1998    Quit date: 07/15/2018    Years since quitting: 5.6   Smokeless tobacco: Never  Vaping Use   Vaping status: Never Used  Substance and Sexual Activity   Alcohol use: Not Currently   Drug use: Never   Sexual activity: Not Currently    Comment: 1st intercourse 53 yo-More than 5 partners  Other Topics Concern   Not on file  Social  History Narrative   Are you right handed or left handed? right   Are you currently employed ? self   What is your current occupation?   Do you live at home alone?with family   Who lives with you?    What type of home do you live in: 1 story or 2 story? two       Social Drivers of Corporate investment banker Strain: Medium Risk (12/24/2023)   Overall Financial Resource Strain (CARDIA)    Difficulty of Paying Living Expenses: Somewhat hard  Food Insecurity: Food Insecurity Present (12/24/2023)   Hunger Vital Sign    Worried About Running Out of Food in the Last Year: Sometimes true    Ran Out of Food in the Last Year: Sometimes true  Transportation Needs: No Transportation Needs (12/24/2023)   PRAPARE - Administrator, Civil Service (Medical): No    Lack of Transportation (Non-Medical): No  Physical Activity: Insufficiently Active (12/24/2023)   Exercise Vital Sign    Days of Exercise per Week: 3 days    Minutes of Exercise per Session: 30 min  Stress: Stress Concern Present (12/24/2023)   [employer] of Occupational Health - Occupational Stress Questionnaire    Feeling of Stress : To some extent  Social Connections: Moderately Isolated (12/24/2023)   Social Connection and Isolation Panel [NHANES]    Frequency of Communication with Friends and Family: More than three times a week    Frequency of Social Gatherings with Friends and Family: Once a week    Attends Religious Services: 1 to 4 times per year    Active Member of [employer] or Organizations: No    Attends Engineer, structural: Not on file    Marital Status: Divorced  Intimate Partner Violence: Not on file                                                                                                  Objective:  Physical Exam: BP 116/73 (BP Location: Left Arm, Patient Position: Sitting, Cuff Size: Large)   Pulse 80   Temp (!) 97 F (36.1 C) (Temporal)   Resp 18   Wt 282  lb 3.2 oz (128 kg)  Comment: pt have on clothes with wieght added  LMP  (LMP Unknown)   SpO2 99%   BMI 44.20 kg/m   Wt Readings from Last 3 Encounters:  02/21/24 282 lb 3.2 oz (128 kg)  01/25/23 288 lb (130.6 kg)  01/25/23 274 lb 9.6 oz (124.6 kg)     Physical Exam Constitutional:      General: She is not in acute distress.    Appearance: Normal appearance. She is not ill-appearing or toxic-appearing.  HENT:     Head: Normocephalic and atraumatic.     Nose: Nose normal. No congestion.  Eyes:     General: No scleral icterus.    Extraocular Movements: Extraocular movements intact.  Cardiovascular:     Rate and Rhythm: Normal rate and regular rhythm.     Pulses: Normal pulses.     Heart sounds: Normal heart sounds.  Pulmonary:     Effort: Pulmonary effort is normal. No respiratory distress.     Breath sounds: Normal breath sounds.  Abdominal:     General: Abdomen is flat. Bowel sounds are normal.     Palpations: Abdomen is soft.  Musculoskeletal:        General: Normal range of motion.     Right lower leg: 1+ Pitting Edema present.     Left lower leg: 1+ Pitting Edema present.  Lymphadenopathy:     Cervical: No cervical adenopathy.  Skin:    General: Skin is warm and dry.     Findings: No rash.  Neurological:     General: No focal deficit present.     Mental Status: She is alert and oriented to person, place, and time. Mental status is at baseline.  Psychiatric:        Mood and Affect: Mood normal.        Behavior: Behavior normal.        Thought Content: Thought content normal.        Judgment: Judgment normal.     No results found.  Recent Results (from the past 2160 hours)  POCT glycosylated hemoglobin (Hb A1C)     Status: Abnormal   Collection Time: 02/21/24  1:55 PM  Result Value Ref Range   Hemoglobin A1C 5.7 (A) 4.0 - 5.6 %   HbA1c POC (<> result, manual entry)     HbA1c, POC (prediabetic range)     HbA1c, POC (controlled diabetic range)          Carnell Christian, MD, MS

## 2024-02-21 NOTE — Patient Instructions (Signed)
  VISIT SUMMARY: Today, we discussed your concerns about blood sugar and weight management. We reviewed your current medications and lifestyle habits, and we talked about some new treatment options to help manage your conditions more effectively.  YOUR PLAN: -HYPERTENSIVE HEART FAILURE: Hypertensive heart failure is a condition where the heart has difficulty pumping blood due to high blood pressure. Your blood pressure is well-controlled with your current medications. We discussed the potential benefit of adding Wegovy to help with heart disease prevention and weight management. Continue taking amlodipine , spironolactone , metoprolol , and losartan  as prescribed. I will provide a note for three days off work due to fatigue and leg swelling.  -PREDIABETES: Prediabetes is a condition where blood sugar levels are higher than normal but not high enough to be classified as diabetes. Your A1c has improved to 5.7%, which is good. We will discontinue metformin  and start you on Wegovy at 0.25 mg weekly to help further control your blood sugar levels. Please monitor your blood sugar regularly.  -OBESITY: Obesity is a condition characterized by excessive body fat. You have lost four pounds through diet and exercise, which is great progress. We will start you on Wegovy at 0.25 mg weekly to help with weight management. I will also refer you to a dietitian for additional nutrition support. Keep up with your physical activity and dietary modifications.  -IRON DEFICIENCY ANEMIA: Iron deficiency anemia is a condition where the body lacks enough iron to produce healthy red blood cells. Continue taking your iron supplements as prescribed.  INSTRUCTIONS: Please follow up with me in three months to review your progress with the new medication and any other concerns you may have. If you experience any side effects or have any questions, do not hesitate to contact the office.

## 2024-02-24 ENCOUNTER — Other Ambulatory Visit: Payer: Self-pay | Admitting: Family Medicine

## 2024-02-24 DIAGNOSIS — I1A0 Resistant hypertension: Secondary | ICD-10-CM

## 2024-02-24 MED ORDER — METOPROLOL TARTRATE 50 MG PO TABS: 50.0000 mg | ORAL_TABLET | Freq: Two times a day (BID) | ORAL | 0 refills | Status: DC

## 2024-02-24 MED ORDER — AMLODIPINE BESYLATE 10 MG PO TABS: 10.0000 mg | ORAL_TABLET | Freq: Every day | ORAL | 0 refills | Status: DC

## 2024-02-24 MED ORDER — SPIRONOLACTONE 50 MG PO TABS
50.0000 mg | ORAL_TABLET | Freq: Two times a day (BID) | ORAL | 0 refills | Status: DC
Start: 2024-02-24 — End: 2024-03-23

## 2024-02-24 MED ORDER — LOSARTAN POTASSIUM 100 MG PO TABS: 100.0000 mg | ORAL_TABLET | Freq: Every day | ORAL | 0 refills | Status: DC

## 2024-03-17 ENCOUNTER — Other Ambulatory Visit (HOSPITAL_COMMUNITY): Payer: Self-pay

## 2024-03-17 ENCOUNTER — Telehealth: Payer: Self-pay

## 2024-03-17 NOTE — Telephone Encounter (Signed)
 Copied from CRM [id number]. Topic: Clinical - Medication Prior Auth >> Mar 17, 2024  8:17 AM Kayla Carey wrote: Reason for CRM: Patient called in stating she had a upcoming appointment regarding her followup to the weight loss medication  Semaglutide -Weight Management (WEGOVY ) 0.25 MG/0.5ML SOAJ , stated she still has not received it so she rescheduled the appointment for 07/18, would like to know if she needs a prior auth for the prescription, because pharmacy stated something needs to be signed off by provider

## 2024-03-17 NOTE — Telephone Encounter (Signed)
 Pharmacy Patient Advocate Encounter   Received notification from CoverMyMeds that prior authorization for Wegovy  0.25MG /0.5ML auto-injectors  is required/requested.   Insurance verification completed.   The patient is insured through [employer] (PROMPT PA)   Per test claim: PA required; PA started via CoverMyMeds. KEY [id number] . Waiting for clinical questions to populate.

## 2024-03-17 NOTE — Telephone Encounter (Signed)
 Called [facility] pharmacy regarding RX and PA is needed.   Hey can we start/submit prior authorization for Semaglutide -Weight Management (WEGOVY ) 0.25 MG/0.5ML. Please see message below.

## 2024-03-18 ENCOUNTER — Other Ambulatory Visit (HOSPITAL_COMMUNITY): Payer: Self-pay

## 2024-03-18 NOTE — Telephone Encounter (Signed)
 Noted

## 2024-03-19 ENCOUNTER — Other Ambulatory Visit (HOSPITAL_COMMUNITY): Payer: Self-pay

## 2024-03-19 NOTE — Telephone Encounter (Signed)
 PLEASE BE ADVISED Clinical questions have been answered and PA submitted.TO PLAN. PA RXBENEFITS (PROMPTOPA)  currently Pending.    Prior Auth (EOC) ID: [id number] Drug/Service Name: WEGOVY  0.25 MG/0.5 ML PEN Date Requested: 03/19/2024 09:36:24

## 2024-03-20 NOTE — Telephone Encounter (Signed)
 Pharmacy Patient Advocate Encounter  Received notification from RXBENEFIT that Prior Authorization for WEGOVY  0.25 MG/0.5 ML PEN has been DENIED.  No reason given; No denial letter received via Fax or CMM. It has been requested and will be uploaded to the media tab once received.

## 2024-03-23 ENCOUNTER — Other Ambulatory Visit: Payer: Self-pay | Admitting: Family Medicine

## 2024-03-23 DIAGNOSIS — I1A0 Resistant hypertension: Secondary | ICD-10-CM

## 2024-03-23 NOTE — Telephone Encounter (Unsigned)
 Copied from CRM [id number]. Topic: Clinical - Medication Refill >> Mar 23, 2024  8:28 AM Trula Gable C wrote: Medication: metoprolol  tartrate (LOPRESSOR ) 50 MG tablet losartan  (COZAAR ) 100 MG tablet amLODipine  (NORVASC ) 10 MG tablet spironolactone  (ALDACTONE ) 50 MG tablet  Has the patient contacted their pharmacy? Yes (Agent: If no, request that the patient contact the pharmacy for the refill. If patient does not wish to contact the pharmacy document the reason why and proceed with request.) (Agent: If yes, when and what did the pharmacy advise?)  This is the patient's preferred pharmacy:  [facility] [address] ([address]), [place] - [address] [place] ([place]) [place] [address] Phone: [phone] Fax: [phone]  [id number] Jonette Nestle, [place] - [address] AT [facility] [address] [facility] [address] [place] [address] Phone: [phone] Fax: [phone]  Is this the correct pharmacy for this prescription? No If no, delete pharmacy and type the correct one.   Has the prescription been filled recently? No  Is the patient out of the medication? Yes  Has the patient been seen for an appointment in the last year OR does the patient have an upcoming appointment? Yes  Can we respond through MyChart? Yes  Agent: Please be advised that Rx refills may take up to 3 business days. We ask that you follow-up with your pharmacy.

## 2024-03-24 ENCOUNTER — Ambulatory Visit: Admitting: Family Medicine

## 2024-03-24 MED ORDER — LOSARTAN POTASSIUM 100 MG PO TABS
100.0000 mg | ORAL_TABLET | Freq: Every day | ORAL | 0 refills | Status: DC
Start: 2024-03-24 — End: 2024-04-21

## 2024-03-24 MED ORDER — SPIRONOLACTONE 50 MG PO TABS: 50.0000 mg | ORAL_TABLET | Freq: Two times a day (BID) | ORAL | 0 refills | Status: DC

## 2024-03-24 MED ORDER — AMLODIPINE BESYLATE 10 MG PO TABS
10.0000 mg | ORAL_TABLET | Freq: Every day | ORAL | 0 refills | Status: DC
Start: 2024-03-24 — End: 2024-04-21

## 2024-03-24 MED ORDER — METOPROLOL TARTRATE 50 MG PO TABS: 50.0000 mg | ORAL_TABLET | Freq: Two times a day (BID) | ORAL | 0 refills | Status: DC

## 2024-04-21 ENCOUNTER — Other Ambulatory Visit: Payer: Self-pay | Admitting: Family Medicine

## 2024-04-21 DIAGNOSIS — I1A0 Resistant hypertension: Secondary | ICD-10-CM

## 2024-05-01 ENCOUNTER — Ambulatory Visit: Admitting: Family Medicine

## 2024-05-13 ENCOUNTER — Other Ambulatory Visit: Payer: Self-pay | Admitting: Family Medicine

## 2024-05-13 DIAGNOSIS — R7303 Prediabetes: Secondary | ICD-10-CM

## 2024-05-15 ENCOUNTER — Encounter: Payer: Self-pay | Admitting: Family Medicine

## 2024-05-21 ENCOUNTER — Other Ambulatory Visit: Payer: Self-pay | Admitting: Family Medicine

## 2024-05-21 DIAGNOSIS — I1A0 Resistant hypertension: Secondary | ICD-10-CM

## 2024-05-21 DIAGNOSIS — R7303 Prediabetes: Secondary | ICD-10-CM

## 2024-06-16 ENCOUNTER — Encounter: Payer: Self-pay | Admitting: Family Medicine

## 2024-06-16 ENCOUNTER — Ambulatory Visit: Admitting: Family Medicine

## 2024-06-16 VITALS — BP 123/76 | HR 65 | Temp 97.0°F | Resp 18 | Wt 284.4 lb

## 2024-06-16 DIAGNOSIS — E782 Mixed hyperlipidemia: Secondary | ICD-10-CM

## 2024-06-16 DIAGNOSIS — Z1159 Encounter for screening for other viral diseases: Secondary | ICD-10-CM

## 2024-06-16 DIAGNOSIS — R42 Dizziness and giddiness: Secondary | ICD-10-CM | POA: Diagnosis not present

## 2024-06-16 DIAGNOSIS — I1A0 Resistant hypertension: Secondary | ICD-10-CM | POA: Diagnosis not present

## 2024-06-16 DIAGNOSIS — I5032 Chronic diastolic (congestive) heart failure: Secondary | ICD-10-CM | POA: Diagnosis not present

## 2024-06-16 DIAGNOSIS — Z114 Encounter for screening for human immunodeficiency virus [HIV]: Secondary | ICD-10-CM

## 2024-06-16 DIAGNOSIS — D509 Iron deficiency anemia, unspecified: Secondary | ICD-10-CM | POA: Diagnosis not present

## 2024-06-16 DIAGNOSIS — I110 Hypertensive heart disease with heart failure: Secondary | ICD-10-CM

## 2024-06-16 DIAGNOSIS — E66813 Obesity, class 3: Secondary | ICD-10-CM | POA: Diagnosis not present

## 2024-06-16 DIAGNOSIS — Z1211 Encounter for screening for malignant neoplasm of colon: Secondary | ICD-10-CM

## 2024-06-16 DIAGNOSIS — Z0000 Encounter for general adult medical examination without abnormal findings: Secondary | ICD-10-CM | POA: Diagnosis not present

## 2024-06-16 DIAGNOSIS — R7303 Prediabetes: Secondary | ICD-10-CM

## 2024-06-16 DIAGNOSIS — Z9189 Other specified personal risk factors, not elsewhere classified: Secondary | ICD-10-CM

## 2024-06-16 DIAGNOSIS — E559 Vitamin D deficiency, unspecified: Secondary | ICD-10-CM

## 2024-06-16 MED ORDER — ASPIRIN EC 81 MG PO TBEC: 81.0000 mg | DELAYED_RELEASE_TABLET | Freq: Every day | ORAL | 3 refills | Status: AC

## 2024-06-16 MED ORDER — INDAPAMIDE 2.5 MG PO TABS: 2.5000 mg | ORAL_TABLET | Freq: Every day | ORAL | 0 refills | Status: DC

## 2024-06-16 MED ORDER — METOPROLOL SUCCINATE ER 100 MG PO TB24
100.0000 mg | ORAL_TABLET | Freq: Every day | ORAL | 3 refills | Status: AC
Start: 2024-06-16 — End: ?

## 2024-06-16 MED ORDER — SPIRONOLACTONE 100 MG PO TABS: 100.0000 mg | ORAL_TABLET | Freq: Every day | ORAL | 3 refills | Status: AC

## 2024-06-16 MED ORDER — FERROUS SULFATE 325 (65 FE) MG PO TABS: 325.0000 mg | ORAL_TABLET | Freq: Every day | ORAL | 3 refills | Status: AC

## 2024-06-16 MED ORDER — LOSARTAN POTASSIUM 100 MG PO TABS: 100.0000 mg | ORAL_TABLET | Freq: Every day | ORAL | 3 refills | Status: DC

## 2024-06-16 MED ORDER — METFORMIN HCL ER 750 MG PO TB24: 750.0000 mg | ORAL_TABLET | Freq: Every day | ORAL | 3 refills | Status: DC

## 2024-06-16 MED ORDER — WEGOVY 0.25 MG/0.5ML ~~LOC~~ SOAJ: 0.2500 mg | SUBCUTANEOUS | 0 refills | Status: DC

## 2024-06-16 MED ORDER — VITAMIN D3 50 MCG (2000 UT) PO CAPS: 2000.0000 [IU] | ORAL_CAPSULE | Freq: Every day | ORAL | 3 refills | Status: AC

## 2024-06-16 NOTE — Progress Notes (Signed)
 Assessment  Assessment/Plan:  Assessment and Plan Assessment & Plan Adult Wellness Visit Annual wellness visit to assess overall health and manage chronic conditions. She declined vaccinations including influenza, pneumococcal, hepatitis B, and Zoster, but may consider them next month. - Screen for hepatitis C with hepatitis C serology - Screen for HIV with HIV serology - Refer to GI for colonoscopy  Class 3 Obesity with serious comorbidity of chronic diastolic heart failure and hypertensive heart disease Class 3 obesity with associated chronic diastolic heart failure and hypertensive heart disease. Previous attempt to initiate Wegovy  was denied by insurance. She is motivated to manage weight and A1c levels. - Resubmit request for Wegovy  (semaglutide ) 0.25 mg - Check A1c and serum glucose - Check kidney and liver function tests  Chronic diastolic heart failure and hypertensive heart disease with heart failure Chronic diastolic heart failure with hypertensive heart disease. Last echocardiogram in 2023 showed EF of 60-65% with grade 2 diastolic dysfunction. No recent cardiology follow-up. She reports no chest pain or shortness of breath. - Refer to cardiology - Order BNP - Schedule EKG on Monday  Resistant hypertension Resistant hypertension with current medications including amlodipine , losartan , metoprolol , and spironolactone . Blood pressure well controlled today at 123/76. Discussed potential hyperaldosteronism and the need for further evaluation. She reports leg swelling, possibly related to amlodipine  use. - Check metabolic panel including liver function, renal function, electrolytes, and magnesium - Check cortisol with dexamethasone  suppression test - Check urinalysis with microalbumin creatinine ratio and reflex culture - Swap amlodipine  10 mg daily with indapamide  2.5 mg to address leg swelling - Continue spironolactone  100 mg daily, metoprolol  succinate 100 mg daily, losartan   100 mg daily - Refer to pulmonology for sleep assessment  Mixed hyperlipidemia Mixed hyperlipidemia requiring ongoing monitoring. -Continue atorvastatin  40 mg daily - Order lipid panel  Prediabetes Prediabetes with previous use of metformin  750 mg XR. She has been without metformin  for a few weeks. - Recheck blood sugar - Continue metformin  750 mg XR  Iron deficiency anemia Iron deficiency anemia. Currently taking iron supplements. 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 Currently taking vitamin D  2000 units daily and meclizine  25 mg as needed.  Edema of lower extremities Edema of lower extremities possibly related to amlodipine  use. - Swap amlodipine  with indapamide   At risk for obstructive sleep apnea At risk for obstructive sleep apnea, especially with resistant hypertension. No previous sleep study conducted. - Refer to pulmonology for sleep assessment     Medications Discontinued During This Encounter  Medication Reason   Semaglutide -Weight Management (WEGOVY ) 0.25 MG/0.5ML SOAJ Reorder   Potassium Chloride  ER 20 MEQ TBCR    amLODipine  (NORVASC ) 10 MG tablet    metoprolol  tartrate (LOPRESSOR ) 50 MG tablet    Cholecalciferol (VITAMIN D3) 2000 units capsule Reorder   aspirin  EC 81 MG tablet Reorder   ferrous sulfate  325 (65 FE) MG tablet Reorder   losartan  (COZAAR ) 100 MG tablet Reorder   spironolactone  (ALDACTONE ) 50 MG tablet Reorder   metFORMIN  (GLUCOPHAGE -XR) 750 MG 24 hr tablet Reorder    Patient Counseling(The following topics were reviewed and/or handout was given):  -Nutrition: Stressed importance of moderation in sodium/caffeine intake, saturated fat and cholesterol, caloric balance, sufficient intake of fresh fruits, vegetables, and fiber.  -Stressed the importance of regular exercise.   -Substance Abuse: Discussed cessation/primary prevention of tobacco,  alcohol, or other drug use; driving or other dangerous activities under the  influence; availability of treatment for abuse.   -Injury prevention: Discussed safety belts, safety helmets, smoke detector, smoking near bedding or upholstery.   -Sexuality: Discussed sexually transmitted diseases, partner selection, use of condoms, avoidance of unintended pregnancy and contraceptive alternatives.   -Dental health: Discussed importance of regular tooth brushing, flossing, and dental visits.  -Health maintenance and immunizations reviewed. Please refer to Health maintenance section.  Return in about 1 month (around 07/16/2024) for BP.        Subjective:   Encounter date: 06/16/2024  Chief Complaint  Patient presents with   Annual Exam    Pt is fasting today   HM due- vaccinations (patient declined for today) and cologuard      Discussed the use of AI scribe software for clinical note transcription with the patient, who gave verbal consent to proceed.  History of Present Illness Kayla Carey is a 53 year old female with class three severe obesity, chronic diastolic congestive heart failure, and hypertensive heart disease who presents for an annual physical and follow-up on chronic conditions.  She has a history of resistant hypertension, managed with amlodipine  10 mg, losartan  100 mg, metoprolol  tartrate 50 mg BID, and spironolactone  50 mg BID. She reports stable blood pressure readings at home, often lower than today's measurement of 123/76 mmHg. She manages her diet by avoiding salt and sweets.  She experiences vertigo associated with vitamin D  deficiency, for which she takes vitamin D  2000 units daily and meclizine  25 mg as needed.  She has hyperlipidemia and is due for a lipid panel. She also has prediabetes and was previously on metformin  750 mg XR daily, which she has been without for a few weeks. She is concerned about her A1c levels and is trying to manage her weight.  She has a  history of iron deficiency anemia and is currently taking iron supplements. She feels tired due to exertion but not at rest. She is unsure if she snores but suspects she might, and she has not had a sleep study.  Her last echocardiogram in 2023 showed an ejection fraction of 60-65% with grade two diastolic dysfunction. She had a cardiac catheterization in 2019, which showed severe left ventricular hypertrophy and hyperdynamic LV function with normal epicardial coronary arteries.  She has a history of hypokalemia, for which she was taking potassium chloride  20 mEq daily, but she has not taken it for years. She reports leg swelling, which she has experienced since she was 53 years old.  She declined vaccinations today, including influenza, pneumococcal, hepatitis B, and zoster vaccines. She has a history of cervical cancer and recalls having a colonoscopy in 2019, but details are unclear.       06/16/2024    4:17 PM 02/21/2024    2:21 PM 11/01/2018   11:15 AM 06/25/2018    4:44 PM 05/27/2018   11:38 AM  Depression screen PHQ 2/9  Decreased Interest 0 0 0 0 2  Down, Depressed, Hopeless 0 1 0 0 1  PHQ - 2 Score 0 1 0 0 3  Altered sleeping 0 1   2  Tired, decreased energy 1 3   2   Change in appetite 0 0   1  Feeling bad or failure about yourself  0 0   1  Trouble concentrating 0 0   0  Moving slowly or fidgety/restless 0 0   0  Suicidal thoughts 0 0   0  PHQ-9 Score 1 5   9   Difficult doing work/chores  Not difficult at all Somewhat difficult   Somewhat difficult       06/16/2024    4:17 PM 02/21/2024    2:21 PM  GAD 7 : Generalized Anxiety Score  Nervous, Anxious, on Edge 0 1  Control/stop worrying 1 1  Worry too much - different things 0 0  Trouble relaxing 0 1  Restless 0 1  Easily annoyed or irritable 0 1  Afraid - awful might happen 0 1  Total GAD 7 Score 1 6  Anxiety Difficulty Not difficult at all Somewhat difficult    Health Maintenance Due  Topic Date Due   COVID-19 Vaccine  (1) Never done   HIV Screening  Never done   Hepatitis C Screening  Never done   Pneumococcal Vaccine: 50+ Years (1 of 2 - PCV) Never done   Hepatitis B Vaccines 19-59 Average Risk (1 of 3 - 19+ 3-dose series) Never done   Zoster Vaccines- Shingrix (1 of 2) Never done   Colonoscopy  Never done   INFLUENZA VACCINE  Never done      PMH:  The following were reviewed and entered/updated in epic: Past Medical History:  Diagnosis Date   Abnormal electrocardiogram (ECG) (EKG) 08/11/2018   Abnormal myocardial perfusion study 08/11/2018   Cervical cancer [facility])    gyn oncologist-  dr anitra--  bx 08-25-2018  ,  invasive SCC, Stage IA1   Chest pain 08/01/2018   Chronic diastolic (congestive) heart failure [facility])    cardiologist-  dr monetta   GERD (gastroesophageal reflux disease)    per pt just drinks water    Hypertension 2012   Hypertensive heart disease 08/01/2018   IDA (iron deficiency anemia)    Malignant neoplasm of cervix [facility])     Patient Active Problem List   Diagnosis Date Noted   Obesity, unspecified 02/21/2024   Hyperlipidemia 04/16/2023   Vertigo 01/30/2023   Prediabetes 01/30/2023   Hypertension    Chronic diastolic (congestive) heart failure (HCC)    Malignant neoplasm of cervix (HCC)    Hypertensive heart disease 08/01/2018   GERD (gastroesophageal reflux disease) 08/01/2018   IDA (iron deficiency anemia) 08/01/2018    Past Surgical History:  Procedure Laterality Date   ABDOMINAL HYSTERECTOMY  2019   In process of the hysterectomy, the right ovary was removed.   BREAST BIOPSY Left    over [place]- benign   BREAST SURGERY Left age 66   Lumpectomy-benign   CERVICAL CONIZATION W/BX N/A 08/25/2018   Procedure: CONIZATION CERVIX WITH BIOPSY AND ENDOCERVICAL CURETTAGE;  Surgeon: anitra Freddy NOVAK, MD;  Location: [facility];  Service: Gynecology;  Laterality: N/A;   LEFT HEART CATH AND CORONARY ANGIOGRAPHY N/A 08/13/2018    Procedure: LEFT HEART CATH AND CORONARY ANGIOGRAPHY;  Surgeon: Burnard Debby LABOR, MD;  Location: [facility] INVASIVE CV LAB;  Service: Cardiovascular;  Laterality: N/A;    Family History  Problem Relation Age of Onset   Hypertension Mother    Congestive Heart Failure Mother    Hypertension Father    Diabetes Father    Cancer Father        Prostate   Kidney cancer Son        son died from disease    Medications- reviewed and updated Outpatient Medications Prior to Visit  Medication Sig Dispense Refill   Ascorbic Acid (VITAMIN C) 1000 MG tablet Take 1,000 mg by mouth daily.      atorvastatin  (LIPITOR) 40 MG tablet Take  1 tablet (40 mg total) by mouth daily. 90 tablet 3   meclizine  (ANTIVERT ) 25 MG tablet Take 1 tablet (25 mg total) by mouth 3 (three) times daily as needed for dizziness. 60 tablet 2   aspirin  EC 81 MG tablet Take 81 mg by mouth daily.     Cholecalciferol (VITAMIN D3) 2000 units capsule Take 2,000 Units by mouth daily.     ferrous sulfate  325 (65 FE) MG tablet Take 1 tablet (325 mg total) by mouth daily with breakfast. 90 tablet 0   losartan  (COZAAR ) 100 MG tablet Take 1 tablet by mouth once daily 30 tablet 0   metFORMIN  (GLUCOPHAGE -XR) 750 MG 24 hr tablet Take 1 tablet by mouth in the evening 30 tablet 0   metoprolol  tartrate (LOPRESSOR ) 50 MG tablet Take 1 tablet by mouth twice daily 60 tablet 0   spironolactone  (ALDACTONE ) 50 MG tablet Take 1 tablet by mouth twice daily 60 tablet 0   amLODipine  (NORVASC ) 10 MG tablet Take 1 tablet by mouth once daily 30 tablet 0   Potassium Chloride  ER 20 MEQ TBCR 1 tab(s) orally Once daily for 30 days     Semaglutide -Weight Management (WEGOVY ) 0.25 MG/0.5ML SOAJ Inject 0.25 mg into the skin once a week. 2 mL 0   No facility-administered medications prior to visit.    No Known Allergies  Social History   Socioeconomic History   Marital status: Single    Spouse name: Not on file   Number of children: Not on file   Years of education:  Not on file   Highest education level: Associate degree: occupational, Scientist, product/process development, or vocational program  Occupational History   Not on file  Tobacco Use   Smoking status: Former    Current packs/day: 0.00    Average packs/day: 0.5 packs/day for 20.0 years (10.0 ttl pk-yrs)    Types: Cigarettes    Start date: 07/15/1998    Quit date: 07/15/2018    Years since quitting: 5.9   Smokeless tobacco: Never  Vaping Use   Vaping status: Never Used  Substance and Sexual Activity   Alcohol use: Not Currently    Comment: 0   Drug use: Never   Sexual activity: Not Currently    Birth control/protection: Abstinence, None    Comment: 0  Other Topics Concern   Not on file  Social History Narrative   Are you right handed or left handed? right   Are you currently employed ? self   What is your current occupation?   Do you live at home alone?with family   Who lives with you?    What type of home do you live in: 1 story or 2 story? two       Social Drivers of Corporate investment banker Strain: Medium Risk (12/24/2023)   Overall Financial Resource Strain (CARDIA)    Difficulty of Paying Living Expenses: Somewhat hard  Food Insecurity: Food Insecurity Present (12/24/2023)   Hunger Vital Sign    Worried About Running Out of Food in the Last Year: Sometimes true    Ran Out of Food in the Last Year: Sometimes true  Transportation Needs: No Transportation Needs (12/24/2023)   PRAPARE - Administrator, Civil Service (Medical): No    Lack of Transportation (Non-Medical): No  Physical Activity: Insufficiently Active (12/24/2023)   Exercise Vital Sign    Days of Exercise per Week: 3 days    Minutes of Exercise per Session: 30 min  Stress: Stress  Concern Present (12/24/2023)   [employer] of Occupational Health - Occupational Stress Questionnaire    Feeling of Stress : To some extent  Social Connections: Moderately Isolated (12/24/2023)   Social Connection and Isolation Panel     Frequency of Communication with Friends and Family: More than three times a week    Frequency of Social Gatherings with Friends and Family: Once a week    Attends Religious Services: 1 to 4 times per year    Active Member of [employer] or Organizations: No    Attends Engineer, structural: Not on file    Marital Status: Divorced           Objective:  Physical Exam: BP 123/76 (BP Location: Left Arm, Patient Position: Sitting, Cuff Size: Large)   Pulse 65   Temp (!) 97 F (36.1 C) (Temporal)   Resp 18   Wt 284 lb 6.4 oz (129 kg)   LMP  (LMP Unknown)   SpO2 100%   BMI 44.54 kg/m   Body mass index is 44.54 kg/m. Wt Readings from Last 3 Encounters:  06/16/24 284 lb 6.4 oz (129 kg)  02/21/24 282 lb 3.2 oz (128 kg)  01/25/23 288 lb (130.6 kg)    Physical Exam VITALS: BP- 123/76 GENERAL: Alert, cooperative, well developed, no acute distress. HEENT: Normocephalic, normal oropharynx, moist mucous membranes. CHEST: Clear to auscultation bilaterally, no wheezes, rhonchi, or crackles. CARDIOVASCULAR: Normal heart rate and rhythm, S1 and S2 normal without murmurs. ABDOMEN: Soft, non-tender, non-distended, without organomegaly, normal bowel sounds. EXTREMITIES: 2+ pitting edema bilateral mid-shin in lower extremity, Leg swelling present, no cyanosis. NEUROLOGICAL: Cranial nerves grossly intact, moves all extremities without gross motor or sensory deficit.  Physical Exam      Prior labs:   No results found for this or any previous visit (from the past 2160 hours).  Lab Results  Component Value Date   CHOL 188 08/23/2023   CHOL 177 04/01/2023   CHOL 184 05/27/2018   Lab Results  Component Value Date   HDL 47.60 08/23/2023   HDL 42.80 04/01/2023   HDL 48 05/27/2018   Lab Results  Component Value Date   LDLCALC 124 (H) 08/23/2023   LDLCALC 103 (H) 04/01/2023   LDLCALC 125 (H) 05/27/2018   Lab Results  Component Value Date   TRIG 79.0 08/23/2023   TRIG 156.0 (H)  04/01/2023   TRIG 54 05/27/2018   Lab Results  Component Value Date   CHOLHDL 4 08/23/2023   CHOLHDL 4 04/01/2023   CHOLHDL 3.8 05/27/2018   No results found for: LDLDIRECT  Last metabolic panel Lab Results  Component Value Date   GLUCOSE 125 (H) 04/01/2023   NA 138 04/01/2023   K 4.1 04/01/2023   CL 102 04/01/2023   CO2 29 04/01/2023   BUN 19 04/01/2023   CREATININE 0.70 04/01/2023   GFR 99.76 04/01/2023   CALCIUM  9.3 04/01/2023   PROT 7.3 08/23/2023   ALBUMIN 4.5 08/23/2023   LABGLOB 2.8 05/27/2018   AGRATIO 1.5 05/27/2018   BILITOT 0.4 08/23/2023   ALKPHOS 53 08/23/2023   AST 17 08/23/2023   ALT 19 08/23/2023   ANIONGAP 10 08/18/2020    Lab Results  Component Value Date   HGBA1C 5.7 (A) 02/21/2024    Last CBC Lab Results  Component Value Date   WBC 3.6 (L) 04/01/2023   HGB 13.2 04/01/2023   HCT 39.6 04/01/2023   MCV 89.8 04/01/2023   MCH 30.3 08/18/2020   RDW  12.9 04/01/2023   PLT 209.0 04/01/2023    Lab Results  Component Value Date   TSH 1.59 04/01/2023    No results found for: PSA1, PSA  Last vitamin D  No results found for: MARIEN BOLLS, VD25OH  Lab Results  Component Value Date   BILIRUBINUR NEGATIVE 04/01/2023   PROTEINUR NEGATIVE 10/03/2018   UROBILINOGEN 0.2 04/01/2023   LEUKOCYTESUR NEGATIVE 04/01/2023    Lab Results  Component Value Date   LABMICR 34.7 05/27/2018     At today's visit, we discussed treatment options, associated risk and benefits, and engage in counseling as needed.  Additionally the following were reviewed: Past medical records, past medical and surgical history, family and social background, as well as relevant laboratory results, imaging findings, and specialty notes, where applicable.  This message was generated using dictation software, and as a result, it may contain unintentional typos or errors.  Nevertheless, extensive effort was made to accurately convey at the pertinent aspects of  the patient visit.    There may have been are other unrelated non-urgent complaints, but due to the busy schedule and the amount of time already spent with her, time does not permit to address these issues at today's visit. Another appointment may have or has been requested to review these additional issues.     Arvella Hummer, MD, MS

## 2024-06-17 LAB — COMPREHENSIVE METABOLIC PANEL WITH GFR
ALT: 21 U/L (ref 0–35)
AST: 24 U/L (ref 0–37)
Albumin: 4.5 g/dL (ref 3.5–5.2)
Alkaline Phosphatase: 44 U/L (ref 39–117)
BUN: 17 mg/dL (ref 6–23)
CO2: 29 meq/L (ref 19–32)
Calcium: 9.3 mg/dL (ref 8.4–10.5)
Chloride: 100 meq/L (ref 96–112)
Creatinine, Ser: 0.63 mg/dL (ref 0.40–1.20)
GFR: 101.46 mL/min (ref >60.00)
Glucose, Bld: 82 mg/dL (ref 70–99)
Potassium: 3.5 meq/L (ref 3.5–5.1)
Sodium: 138 meq/L (ref 135–145)
Total Bilirubin: 0.5 mg/dL (ref 0.2–1.2)
Total Protein: 7.2 g/dL (ref 6.0–8.3)

## 2024-06-17 LAB — LIPID PANEL
Cholesterol: 109 mg/dL (ref 0–200)
HDL: 45.9 mg/dL (ref >39.00)
LDL Cholesterol: 51 mg/dL (ref 0–99)
NonHDL: 63.34
Total CHOL/HDL Ratio: 2
Triglycerides: 61 mg/dL (ref 0.0–149.0)
VLDL: 12.2 mg/dL (ref 0.0–40.0)

## 2024-06-17 LAB — CBC WITH DIFFERENTIAL/PLATELET
Basophils Absolute: 0 K/uL (ref 0.0–0.1)
Basophils Relative: 0.9 % (ref 0.0–3.0)
Eosinophils Absolute: 0 K/uL (ref 0.0–0.7)
Eosinophils Relative: 1 % (ref 0.0–5.0)
HCT: 37.8 % (ref 36.0–46.0)
Hemoglobin: 12.7 g/dL (ref 12.0–15.0)
Lymphocytes Relative: 47.2 % — ABNORMAL HIGH (ref 12.0–46.0)
Lymphs Abs: 2.1 K/uL (ref 0.7–4.0)
MCHC: 33.6 g/dL (ref 30.0–36.0)
MCV: 89.7 fl (ref 78.0–100.0)
Monocytes Absolute: 0.3 K/uL (ref 0.1–1.0)
Monocytes Relative: 5.8 % (ref 3.0–12.0)
Neutro Abs: 2 K/uL (ref 1.4–7.7)
Neutrophils Relative %: 45.1 % (ref 43.0–77.0)
Platelets: 221 K/uL (ref 150.0–400.0)
RBC: 4.21 Mil/uL (ref 3.87–5.11)
RDW: 12.9 % (ref 11.5–15.5)
WBC: 4.4 K/uL (ref 4.0–10.5)

## 2024-06-17 LAB — TSH: TSH: 1.07 u[IU]/mL (ref 0.35–5.50)

## 2024-06-17 LAB — FOLATE: Folate: 18.6 ng/mL (ref >5.9)

## 2024-06-17 LAB — HEMOGLOBIN A1C: Hgb A1c MFr Bld: 6.2 % (ref 4.6–6.5)

## 2024-06-17 LAB — VITAMIN B12: Vitamin B-12: 592 pg/mL (ref 211–911)

## 2024-06-17 LAB — FERRITIN: Ferritin: 124.5 ng/mL (ref 10.0–291.0)

## 2024-06-17 LAB — MAGNESIUM: Magnesium: 1.7 mg/dL (ref 1.5–2.5)

## 2024-06-18 LAB — SPECIMEN STATUS REPORT

## 2024-06-20 LAB — URINALYSIS W MICROSCOPIC + REFLEX CULTURE
Bilirubin Urine: NEGATIVE
Glucose, UA: NEGATIVE
Hgb urine dipstick: NEGATIVE
Hyaline Cast: NONE SEEN /LPF
Ketones, ur: NEGATIVE
Nitrites, Initial: NEGATIVE
Protein, ur: NEGATIVE
Specific Gravity, Urine: 1.025 (ref 1.001–1.035)
pH: 5.5 (ref 5.0–8.0)

## 2024-06-20 LAB — VITAMIN D 1,25 DIHYDROXY
Vitamin D 1, 25 (OH)2 Total: 34 pg/mL (ref 18–72)
Vitamin D2 1, 25 (OH)2: <8 pg/mL
Vitamin D3 1, 25 (OH)2: 34 pg/mL

## 2024-06-20 LAB — HIV ANTIBODY (ROUTINE TESTING W REFLEX): HIV 1&2 Ab, 4th Generation: NONREACTIVE

## 2024-06-20 LAB — HEPATITIS C ANTIBODY: Hepatitis C Ab: NONREACTIVE

## 2024-06-20 LAB — URINE CULTURE
MICRO NUMBER:: 16914204
SPECIMEN QUALITY:: ADEQUATE

## 2024-06-20 LAB — CULTURE INDICATED

## 2024-06-20 LAB — IRON, TOTAL/TOTAL IRON BINDING CAP
%SAT: 21 % (ref 16–45)
Iron: 75 ug/dL (ref 45–160)
TIBC: 356 ug/dL (ref 250–450)

## 2024-06-30 ENCOUNTER — Telehealth: Payer: Self-pay

## 2024-06-30 NOTE — Progress Notes (Signed)
 Complex Care Management Note  Care Guide Note 06/30/2024 Name: Kayla Carey MRN: [id number] DOB: 08-16-1971  Kayla Carey is a 53 y.o. year old female who sees Sebastian Beverley NOVAK, MD for primary care. I reached out to Aleck Canavan by phone today to offer complex care management services.  [name] was given information about Complex Care Management services today including:   The Complex Care Management services include support from the care team which includes your Nurse Care Manager, Clinical Social Worker, or Pharmacist.  The Complex Care Management team is here to help remove barriers to the health concerns and goals most important to you. Complex Care Management services are voluntary, and the patient may decline or stop services at any time by request to their care team member.   Complex Care Management Consent Status: Patient agreed to services and verbal consent obtained.   Follow up plan:  Telephone appointment with complex care management team member scheduled for:  07/14/24 at 12:00 p.m.   Encounter Outcome:  Patient Scheduled  Dreama [facility]  [facility], [facility] VBCI Assistant Direct Dial: [phone]  Fax: [phone]

## 2024-07-09 ENCOUNTER — Telehealth: Admitting: Physician Assistant

## 2024-07-09 DIAGNOSIS — K219 Gastro-esophageal reflux disease without esophagitis: Secondary | ICD-10-CM | POA: Diagnosis not present

## 2024-07-09 DIAGNOSIS — T50905A Adverse effect of unspecified drugs, medicaments and biological substances, initial encounter: Secondary | ICD-10-CM | POA: Diagnosis not present

## 2024-07-09 MED ORDER — OMEPRAZOLE 40 MG PO CPDR: 40.0000 mg | DELAYED_RELEASE_CAPSULE | Freq: Every day | ORAL | 0 refills | Status: AC

## 2024-07-09 NOTE — Patient Instructions (Signed)
 Aleck Canavan, thank you for joining [name], PA-C for today's virtual visit.  While this provider is not your primary care provider (PCP), if your PCP is located in our provider database this encounter information will be shared with them immediately following your visit.   A [facility] MyChart account gives you access to today's visit and all your visits, tests, and labs performed at [facility]  click here if you don't have a [facility] MyChart account or go to [email or web address]  Consent: (Patient) Kayla Carey provided verbal consent for this virtual visit at the beginning of the encounter.  Current Medications:  Current Outpatient Medications:    omeprazole  (PRILOSEC) 40 MG capsule, Take 1 capsule (40 mg total) by mouth daily., Disp: 30 capsule, Rfl: 0   Ascorbic Acid (VITAMIN C) 1000 MG tablet, Take 1,000 mg by mouth daily. , Disp: , Rfl:    aspirin  EC 81 MG tablet, Take 1 tablet (81 mg total) by mouth daily., Disp: 90 tablet, Rfl: 3   atorvastatin  (LIPITOR) 40 MG tablet, Take 1 tablet (40 mg total) by mouth daily., Disp: 90 tablet, Rfl: 3   Cholecalciferol (VITAMIN D3) 50 MCG (2000 UT) capsule, Take 1 capsule (2,000 Units total) by mouth daily., Disp: 90 capsule, Rfl: 3   ferrous sulfate  325 (65 FE) MG tablet, Take 1 tablet (325 mg total) by mouth daily with breakfast., Disp: 90 tablet, Rfl: 3   indapamide  (LOZOL ) 2.5 MG tablet, Take 1 tablet (2.5 mg total) by mouth daily., Disp: 90 tablet, Rfl: 0   losartan  (COZAAR ) 100 MG tablet, Take 1 tablet (100 mg total) by mouth daily., Disp: 90 tablet, Rfl: 3   meclizine  (ANTIVERT ) 25 MG tablet, Take 1 tablet (25 mg total) by mouth 3 (three) times daily as needed for dizziness., Disp: 60 tablet, Rfl: 2   metFORMIN  (GLUCOPHAGE -XR) 750 MG 24 hr tablet, Take 1 tablet (750 mg total) by mouth daily with breakfast., Disp: 90 tablet, Rfl: 3   metoprolol  succinate (TOPROL -XL) 100 MG 24 hr tablet, Take 1 tablet  (100 mg total) by mouth daily. Take with or immediately following a meal., Disp: 90 tablet, Rfl: 3   semaglutide -weight management (WEGOVY ) 0.25 MG/0.5ML SOAJ SQ injection, Inject 0.25 mg into the skin once a week., Disp: 2 mL, Rfl: 0   spironolactone  (ALDACTONE ) 100 MG tablet, Take 1 tablet (100 mg total) by mouth daily., Disp: 90 tablet, Rfl: 3   Medications ordered in this encounter:  Meds ordered this encounter  Medications   omeprazole  (PRILOSEC) 40 MG capsule    Sig: Take 1 capsule (40 mg total) by mouth daily.    Dispense:  30 capsule    Refill:  0    Supervising Provider:   BLAISE ALEENE KIDD [[id number]]     *If you need refills on other medications prior to your next appointment, please contact your pharmacy*  Follow-Up: Call back or seek an in-person evaluation if the symptoms worsen or if the condition fails to improve as anticipated.  [facility] [phone]   If you have been instructed to have an in-person evaluation today at a local Urgent Care facility, please use the link below. It will take you to a list of all of our available [facility], including address, phone number and hours of operation. Please do not delay care.  [facility]  If you or a family member do not have a primary care provider, use the link below to  schedule a visit and establish care. When you choose a [facility] primary care physician or advanced practice provider, you gain a long-term partner in health. Find a Primary Care Provider  Learn more about [facility]'s in-office and virtual care options: [facility] - Get Care Now

## 2024-07-09 NOTE — Progress Notes (Signed)
 Virtual Visit Consent   Kayla Carey, you are scheduled for a virtual visit with a [facility] provider today. Just as with appointments in the office, your consent must be obtained to participate. Your consent will be active for this visit and any virtual visit you may have with one of our providers in the next 365 days. If you have a MyChart account, a copy of this consent can be sent to you electronically.  As this is a virtual visit, video technology does not allow for your provider to perform a traditional examination. This may limit your provider's ability to fully assess your condition. If your provider identifies any concerns that need to be evaluated in person or the need to arrange testing (such as labs, EKG, etc.), we will make arrangements to do so. Although advances in technology are sophisticated, we cannot ensure that it will always work on either your end or our end. If the connection with a video visit is poor, the visit may have to be switched to a telephone visit. With either a video or telephone visit, we are not always able to ensure that we have a secure connection.  By engaging in this virtual visit, you consent to the provision of healthcare and authorize for your insurance to be billed (if applicable) for the services provided during this visit. Depending on your insurance coverage, you may receive a charge related to this service.  I need to obtain your verbal consent now. Are you willing to proceed with your visit today? Kayla Carey has provided verbal consent on 07/09/2024 for a virtual visit (video or telephone). Kayla CHRISTELLA Dickinson, PA-C  Date: 07/09/2024 9:53 AM   Virtual Visit via Video Note   I, Kayla Carey, connected with  Kayla Carey  ([id number], [birth year]) on 07/09/24 at  8:00 AM EDT by a video-enabled telemedicine application and verified that I am speaking with the correct person using two identifiers.  Location: Patient: Virtual Visit  Location Patient: Home Provider: Virtual Visit Location Provider: Home Office   I discussed the limitations of evaluation and management by telemedicine and the availability of in person appointments. The patient expressed understanding and agreed to proceed.    History of Present Illness: Kayla Carey is a 53 y.o. who identifies as a female who was assigned female at birth, and is being seen today for chest discomfort. Started about 6 days ago. Located in the substernal region and does not radiate. Is associated with a spasm type sensation and a choking sensation. Worsens with eating and anxiety. Does subside with rest. Took tums once and that helped as well. Was started on Indapamide  10 days ago. Was switched from Amlodipine  that was causing leg swelling. Feels this medication is causing the symptoms.   Problems:  Patient Active Problem List   Diagnosis Date Noted   Obesity, unspecified 02/21/2024   Hyperlipidemia 04/16/2023   Vertigo 01/30/2023   Prediabetes 01/30/2023   Hypertension    Chronic diastolic (congestive) heart failure (HCC)    Malignant neoplasm of cervix (HCC)    Hypertensive heart disease 08/01/2018   GERD (gastroesophageal reflux disease) 08/01/2018   IDA (iron deficiency anemia) 08/01/2018    Allergies: No Known Allergies Medications:  Current Outpatient Medications:    omeprazole  (PRILOSEC) 40 MG capsule, Take 1 capsule (40 mg total) by mouth daily., Disp: 30 capsule, Rfl: 0   Ascorbic Acid (VITAMIN C) 1000 MG tablet, Take 1,000 mg by mouth daily. , Disp: , Rfl:    aspirin   EC 81 MG tablet, Take 1 tablet (81 mg total) by mouth daily., Disp: 90 tablet, Rfl: 3   atorvastatin  (LIPITOR) 40 MG tablet, Take 1 tablet (40 mg total) by mouth daily., Disp: 90 tablet, Rfl: 3   Cholecalciferol (VITAMIN D3) 50 MCG (2000 UT) capsule, Take 1 capsule (2,000 Units total) by mouth daily., Disp: 90 capsule, Rfl: 3   ferrous sulfate  325 (65 FE) MG tablet, Take 1 tablet (325 mg  total) by mouth daily with breakfast., Disp: 90 tablet, Rfl: 3   indapamide  (LOZOL ) 2.5 MG tablet, Take 1 tablet (2.5 mg total) by mouth daily., Disp: 90 tablet, Rfl: 0   losartan  (COZAAR ) 100 MG tablet, Take 1 tablet (100 mg total) by mouth daily., Disp: 90 tablet, Rfl: 3   meclizine  (ANTIVERT ) 25 MG tablet, Take 1 tablet (25 mg total) by mouth 3 (three) times daily as needed for dizziness., Disp: 60 tablet, Rfl: 2   metFORMIN  (GLUCOPHAGE -XR) 750 MG 24 hr tablet, Take 1 tablet (750 mg total) by mouth daily with breakfast., Disp: 90 tablet, Rfl: 3   metoprolol  succinate (TOPROL -XL) 100 MG 24 hr tablet, Take 1 tablet (100 mg total) by mouth daily. Take with or immediately following a meal., Disp: 90 tablet, Rfl: 3   semaglutide -weight management (WEGOVY ) 0.25 MG/0.5ML SOAJ SQ injection, Inject 0.25 mg into the skin once a week., Disp: 2 mL, Rfl: 0   spironolactone  (ALDACTONE ) 100 MG tablet, Take 1 tablet (100 mg total) by mouth daily., Disp: 90 tablet, Rfl: 3  Observations/Objective: Patient is well-developed, well-nourished in no acute distress.  Resting comfortably at home.  Head is normocephalic, atraumatic.  No labored breathing.  Speech is clear and coherent with logical content.  Patient is alert and oriented at baseline.    Assessment and Plan: 1. Gastroesophageal reflux disease without esophagitis (Primary) - omeprazole  (PRILOSEC) 40 MG capsule; Take 1 capsule (40 mg total) by mouth daily.  Dispense: 30 capsule; Refill: 0  2. Medication side effect, initial encounter - omeprazole  (PRILOSEC) 40 MG capsule; Take 1 capsule (40 mg total) by mouth daily.  Dispense: 30 capsule; Refill: 0  - Suspect medication side effect causing GERD/esophagitis symptoms - Added Omeprazole  for now to lessen symptoms - Advised to continue Indapamide  for now until she discusses with her PCP so it can be replaced with another medication to not lose control of her BP - Could take at her awakening as she  works 3rd shift so she would be upright after taking and to take with food (she has been taking in the morning which is her bedtime and lying down after taking) - Seek immediate in-person evaluation if symptoms worsen or fail to resolve  Follow Up Instructions: I discussed the assessment and treatment plan with the patient. The patient was provided an opportunity to ask questions and all were answered. The patient agreed with the plan and demonstrated an understanding of the instructions.  A copy of instructions were sent to the patient via MyChart unless otherwise noted below.    The patient was advised to call back or seek an in-person evaluation if the symptoms worsen or if the condition fails to improve as anticipated.    Kayla CHRISTELLA Dickinson, PA-C

## 2024-07-11 LAB — DEXAMETHASONE, BLOOD: Dexamethasone, Blood: <30 ng/dL

## 2024-07-11 LAB — CORTISOL DEXAMETHASONE REFLEX: Cortisol, Serum LCMS: 3.7 ug/dL — ABNORMAL HIGH

## 2024-07-11 LAB — PRO B NATRIURETIC PEPTIDE: NT-Pro BNP: 82 pg/mL (ref 0–249)

## 2024-07-12 ENCOUNTER — Ambulatory Visit: Payer: Self-pay | Admitting: Family Medicine

## 2024-07-14 ENCOUNTER — Other Ambulatory Visit: Payer: Self-pay

## 2024-07-14 NOTE — Progress Notes (Signed)
 07/14/2024 Name: Kayla Carey MRN: [id number] DOB: 1971-02-04  Chief Complaint  Patient presents with   Medication Management   Kayla Carey is a 53 y.o. year old female who presented for a telephone visit.   They were referred to the pharmacist by their PCP for assistance in managing medication access.   Subjective:  Care Team: Primary Care Provider: Sebastian Beverley NOVAK, MD Medication Access/Adherence  Current Pharmacy:  [facility] [address] ([address]), [place] - [address] [place] ([place]) [place] [address] Phone: [phone] Fax: [phone]  [id number] - [address] [place] - [address] [place] [address] Phone: [phone] Fax: [phone]  -Patient reports affordability concerns with their medications: No  -Patient reports access/transportation concerns to their pharmacy: No  -Patient reports adherence concerns with their medications:  Yes    Heart Failure (EF 60-65%): Current medications:  ACEi/ARB/ARNI: losartan  100mg  daily SGLT2i: N/A Beta blocker: metoprolol  xl 100mg  daily Mineralocorticoid Receptor Antagonist: spironolactone  100mg  daily Diuretic regimen: indapamide  2.5mg  daily -Current home blood pressure readings: 103/73 today, last OV BP 123/76 on 9/2 -Amlodipine  was recently changed to indapamide  based on swelling in the ankles.  Patient states this has improved, but she has noticed chest discomfort.  Had a telephone visit to address this recently, and she endorsed discomfort is associated with eating and worsens when she lays down to sleep. Omeprazole  40mg  daily was initiated and has helped.  Obesity/Pre-Diabetes Current medications:  metformin  XR 750mg  daily -Wegovy  was prescribed, but insurance has denied coverage -Current BMI:  44.5kg/m2 -A1c 6.2%  Objective: Lab Results  Component Value Date   HGBA1C 6.2 06/16/2024   Lab Results  Component Value Date   CREATININE 0.63  06/16/2024   BUN 17 06/16/2024   NA 138 06/16/2024   K 3.5 06/16/2024   CL 100 06/16/2024   CO2 29 06/16/2024   Lab Results  Component Value Date   CHOL 109 06/16/2024   HDL 45.90 06/16/2024   LDLCALC 51 06/16/2024   TRIG 61.0 06/16/2024   CHOLHDL 2 06/16/2024   Medications Reviewed Today     Reviewed by Kayla Carey, RPH (Pharmacist) on 07/14/24 at 1219  Med List Status: <None>   Medication Order Taking? Sig Documenting Provider Last Dose Status Informant  Ascorbic Acid (VITAMIN C) 1000 MG tablet [id number] Yes Take 1,000 mg by mouth daily.   Patient taking differently: Take 500 mg by mouth 2 (two) times daily.   [provider]  Active Self  aspirin  EC 81 MG tablet [id number] Yes Take 1 tablet (81 mg total) by mouth daily. Kayla Beverley NOVAK, MD  Active   atorvastatin  (LIPITOR) 40 MG tablet [id number] Yes Take 1 tablet (40 mg total) by mouth daily. Kayla Beverley NOVAK, MD  Active   B Complex Vitamins (B COMPLEX PO) [id number] Yes Take 1 tablet by mouth daily. [provider]  Active   Cholecalciferol (VITAMIN D3) 50 MCG (2000 UT) capsule [id number] Yes Take 1 capsule (2,000 Units total) by mouth daily. Kayla Beverley NOVAK, MD  Active   ferrous sulfate  325 (65 FE) MG tablet [id number] Yes Take 1 tablet (325 mg total) by mouth daily with breakfast. Kayla Beverley NOVAK, MD  Active   indapamide  (LOZOL ) 2.5 MG tablet [id number] Yes Take 1 tablet (2.5 mg total) by mouth daily. Kayla Beverley NOVAK, MD  Active   losartan  (COZAAR ) 100 MG tablet [id number] Yes Take 1 tablet (100 mg total) by mouth daily. Kayla Beverley NOVAK,  MD  Active   meclizine  (ANTIVERT ) 25 MG tablet [id number]  Take 1 tablet (25 mg total) by mouth 3 (three) times daily as needed for dizziness.  Patient not taking: Reported on 07/14/2024   Kayla Beverley NOVAK, MD  Active   metFORMIN  (GLUCOPHAGE -XR) 750 MG 24 hr tablet [id number] Yes Take 1 tablet (750 mg total) by mouth daily with breakfast. Kayla Beverley NOVAK, MD  Active    metoprolol  succinate (TOPROL -XL) 100 MG 24 hr tablet [id number] Yes Take 1 tablet (100 mg total) by mouth daily. Take with or immediately following a meal. Kayla Beverley NOVAK, MD  Active   omeprazole  (PRILOSEC) 40 MG capsule [id number] Yes Take 1 capsule (40 mg total) by mouth daily. Vivienne Nest M, PA-C  Active   semaglutide -weight management (WEGOVY ) 0.25 MG/0.5ML SOAJ SQ injection [id number]  Inject 0.25 mg into the skin once a week.  Patient not taking: Reported on 07/14/2024   Kayla Beverley NOVAK, MD  Active   spironolactone  (ALDACTONE ) 100 MG tablet [id number] Yes Take 1 tablet (100 mg total) by mouth daily. Kayla Beverley NOVAK, MD  Active            Assessment/Plan:   Heart Failure: -Currently appropriately managed -Continue current regimen at this time -Based on improvement in symptoms, it does sound like chest discomfort is related to heart burn/acid reflux.  Continue omeprazole  40mg  daily.  Advised patient to take this 30 minutes prior to meal closest to when she goes to bed (works 3rd shift). -Continue to monitor and record home BP -Monitor for fluid retention, any SOB, chest pain and report this to PCP or cardiologist ASAP  Obesity/Pre-Diabetes -Continue metformin  XR 750mg  daily -Contacted patient's insurance, and no matter the indication, they do not cover any weight loss medications (injectables or oral)  Follow Up Plan: Telephone visit scheduled for Thursday to discuss Wegovy - will also schedule f/u in 3-4 weeks to make sure omeprazole  continues to be effective  Kayla Carey, PharmD, DPLA

## 2024-07-15 ENCOUNTER — Ambulatory Visit: Admitting: Cardiology

## 2024-07-16 ENCOUNTER — Other Ambulatory Visit: Payer: Self-pay

## 2024-07-16 NOTE — Progress Notes (Signed)
 07/16/2024 Name: Kayla Carey MRN: [id number] DOB: Jan 05, 1971  SubjectiveObjective: Telephone visit to follow-up on medication management/access  Heart Failure (EF 60-65%): Current medications:  ACEi/ARB/ARNI: losartan  100mg  daily SGLT2i: N/A Beta blocker: metoprolol  xl 100mg  daily Mineralocorticoid Receptor Antagonist: spironolactone  100mg  daily Diuretic regimen: indapamide  2.5mg  daily -Current home blood pressure readings: 103/73 today, last OV BP 123/76 on 9/2 -Amlodipine  was recently changed to indapamide  based on swelling in the ankles.  Patient states this has improved, but she has noticed chest discomfort.  Had a telephone visit to address this recently, and she endorsed discomfort is associated with eating and worsens when she lays down to sleep. Omeprazole  40mg  daily was initiated and this is helping with symptoms.  Obesity/Pre-Diabetes Current medications:  metformin  XR 750mg  daily -Wegovy  was prescribed, but insurance will not cover any weight loss medications for any indication -Current BMI:  44.5kg/m2 -A1c 6.2%  Objective: Lab Results  Component Value Date   HGBA1C 6.2 06/16/2024   Lab Results  Component Value Date   CREATININE 0.63 06/16/2024   BUN 17 06/16/2024   NA 138 06/16/2024   K 3.5 06/16/2024   CL 100 06/16/2024   CO2 29 06/16/2024   Lab Results  Component Value Date   CHOL 109 06/16/2024   HDL 45.90 06/16/2024   LDLCALC 51 06/16/2024   TRIG 61.0 06/16/2024   CHOLHDL 2 06/16/2024   Medications Reviewed Today     Reviewed by Deanna Channing LABOR, RPH (Pharmacist) on 07/16/24 at 1008  Med List Status: <None>   Medication Order Taking? Sig Documenting Provider Last Dose Status Informant  Ascorbic Acid (VITAMIN C) 1000 MG tablet [id number]  Take 1,000 mg by mouth daily.   Patient taking differently: Take 500 mg by mouth 2 (two) times daily.   [provider]  Active Self  aspirin  EC 81 MG tablet [id number] Yes Take 1 tablet (81 mg total) by  mouth daily. Sebastian Beverley NOVAK, MD  Active   atorvastatin  (LIPITOR) 40 MG tablet [id number] Yes Take 1 tablet (40 mg total) by mouth daily. Sebastian Beverley NOVAK, MD  Active   B Complex Vitamins (B COMPLEX PO) [id number]  Take 1 tablet by mouth daily. [provider]  Active   Cholecalciferol (VITAMIN D3) 50 MCG (2000 UT) capsule [id number]  Take 1 capsule (2,000 Units total) by mouth daily. Sebastian Beverley NOVAK, MD  Active   ferrous sulfate  325 (65 FE) MG tablet [id number]  Take 1 tablet (325 mg total) by mouth daily with breakfast. Sebastian Beverley NOVAK, MD  Active   indapamide  (LOZOL ) 2.5 MG tablet [id number] Yes Take 1 tablet (2.5 mg total) by mouth daily. Sebastian Beverley NOVAK, MD  Active   losartan  (COZAAR ) 100 MG tablet [id number] Yes Take 1 tablet (100 mg total) by mouth daily. Sebastian Beverley NOVAK, MD  Active   meclizine  (ANTIVERT ) 25 MG tablet [id number]  Take 1 tablet (25 mg total) by mouth 3 (three) times daily as needed for dizziness.  Patient not taking: Reported on 07/14/2024   Sebastian Beverley NOVAK, MD  Active   metFORMIN  (GLUCOPHAGE -XR) 750 MG 24 hr tablet [id number] Yes Take 1 tablet (750 mg total) by mouth daily with breakfast. Sebastian Beverley NOVAK, MD  Active   metoprolol  succinate (TOPROL -XL) 100 MG 24 hr tablet [id number] Yes Take 1 tablet (100 mg total) by mouth daily. Take with or immediately following a meal. Sebastian Beverley NOVAK, MD  Active   omeprazole  (PRILOSEC) 40 MG capsule [id number] Yes Take 1 capsule (40 mg total)  by mouth daily. Vivienne Nest M, PA-C  Active   spironolactone  (ALDACTONE ) 100 MG tablet [id number] Yes Take 1 tablet (100 mg total) by mouth daily. Sebastian Beverley NOVAK, MD  Active            Assessment/Plan:   Heart Failure: -Continue current regimen at this time -Based on improvement in symptoms, it does sound like chest discomfort is related to heart burn/acid reflux.  Continue omeprazole  40mg  daily.  Advised patient to take this 30 minutes prior to meal closest to when she  goes to bed (works 3rd shift). -Continue to monitor and record home BP -Monitor for fluid retention, any SOB, chest pain and report this to PCP or cardiologist ASAP -Patient sees cardiology again 10/14 and then plans to schedule a follow-up with Dr. Sebastian in the near future -Could consider addition of SGLT2 based on HF diagnosis (and pre-diabetes) as long as patient does not have PMH of recurrent/frequent genitourinary infections- test claim reflects a 90 day supply of Jardiance 10mg  daily would be $29.99  Obesity/Pre-Diabetes -Continue metformin  XR 750mg  daily -Patient states she will be changing jobs in the next 60 days, and her insurance may change.  Even if not, she may consider paying out of pocket for Wegovy .  I informed her this can be ordered with a prescription directly through the manufacturer at $499/month.    Follow Up Plan: Sending a MyChart message with my direct phone number in case patient would like to further discuss Wegovy    Channing DELENA Mealing, PharmD, DPLA

## 2024-07-17 ENCOUNTER — Ambulatory Visit: Admitting: Cardiology

## 2024-07-24 NOTE — Progress Notes (Signed)
 Cardiology Office Note   Date:  07/28/2024  ID:  Kayla Carey, DOB Jun 17, 1971, MRN [id number] PCP: Sebastian Beverley NOVAK, MD  [facility] Providers Cardiologist:  Redell Leiter, MD     History of Present Illness Kayla Carey is a 53 y.o. female with a past medical history of HFpEF, hypertension, GERD, history of cervical cancer, dyslipidemia, iron deficiency anemia.  08/17/2022 echo EF 60 to 65%, moderate to severe LVH, grade 2 DD, LA severely dilated, RA moderately dilated 08/13/2018 left heart cath normal coronary arteries 08/06/2018 Lexiscan  intermediate risk secondary to transient ischemic dilatation 08/05/2018 echo EF 75-80, LVH present, grade 1 DD, LA moderately dilated, RA mildly dilated  She initially established care with Dr. Leiter in October 2019 for preoperative cardiovascular evaluation, she had been having inexplicable chest pain and an abnormal EKG along with a murmur.  An echo was arranged revealing left ventricular hypertrophy and grade 1 DD, she underwent a Lexiscan  which was intermediate risk secondary to transient ischemic dilatation and so she ultimately underwent a left heart cath on 08/13/2018 revealing normal coronary arteries.  Most recently evaluated by Dr. Leiter on 08/15/2022, blood pressure had been hard to control, repeat echocardiogram was arranged revealing moderate to severe LVH, grade 2 DD which was felt to be secondary to uncontrolled hypertension.  She presents today for follow-up of her hypertension, she has been doing well since she was last evaluated in our office.  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  Her blood pressure is under good control, at her PCP on 06/16/2024 it was 123/76.  She does have some pedal edema however this is at baseline and overall unchanged for her. She denies chest pain, palpitations, dyspnea, pnd, orthopnea, n, v, dizziness, syncope, edema, weight gain, or early satiety.    ROS:  Review of Systems  Cardiovascular:  Positive for leg swelling.  All other systems reviewed and are negative.    Studies Reviewed EKG Interpretation Date/Time:  Tuesday July 28 2024 15:50:56 EDT Ventricular Rate:  74 PR Interval:  188 QRS Duration:  92 QT Interval:  386 QTC Calculation: 428 R Axis:   24  Text Interpretation: Normal sinus rhythm Biatrial enlargement ST & T wave abnormality, consider lateral ischemia When compared with ECG of 08-Aug-2020 19:38, PREVIOUS ECG IS PRESENT Confirmed by Carlin Nest [phone]) on 07/28/2024 3:54:43 PM    Cardiac Studies & Procedures   ______________________________________________________________________________________________ CARDIAC CATHETERIZATION  CARDIAC CATHETERIZATION 08/13/2018  Conclusion  LV end diastolic pressure is severely elevated.  Normal epicardial coronary arteries with a dominant RCA coronary circulation.  Significant elevation of LVEDP at 40 mm in this patient with hyperdynamic LV function and severe LVH with diastolic dysfunction.  RECOMMENDATION: Medical therapy with optimal blood pressure control. No indication for antiplatelet therapy at this time.  Findings Coronary Findings Diagnostic  Dominance: Right  No diagnostic findings have been documented. Intervention  No interventions have been documented.   STRESS TESTS  MYOCARDIAL PERFUSION IMAGING 08/06/2018  Interpretation Summary  Baseline EKG with LVH and repolarization abnormality with no change during infusion.  The left ventricular ejection fraction is normal (55-65%).  The perfusion study is normal.  There is transient ischemic dilatation with a TID of 1.3 which can be seen with multivessel CAD. Consider coronary CTA for further evaluation.  This is an intermediate risk study due to transient ischemic dilatation.   ECHOCARDIOGRAM  ECHOCARDIOGRAM COMPLETE 08/17/2022  Narrative ECHOCARDIOGRAM REPORT    Patient  Name:   Kayla  Carey Date of Exam: 08/17/2022 Medical Rec #:  [id number]        Height:       67.0 in Accession #:    [id number]       Weight:       273.0 lb Date of Birth:  Jan 04, 1971         BSA:          2.308 m Patient Age:    51 years         BP:           145/90 mmHg Patient Gender: F                HR:           74 bpm. Exam Location:  [place]  Procedure: 2D Echo, Cardiac Doppler, Color Doppler, Strain Analysis and 3D Echo  Indications:    I50.30* Unspecified diastolic (congestive) heart failure  History:        Patient has prior history of Echocardiogram examinations, most recent 08/05/2018. CHF, Signs/Symptoms:Dizziness/Lightheadedness; Risk Factors:Former Smoker and Hypertension. Patient denies chest pain, SOB and leg edema. She does have dizziness.  Sonographer:    Annabella Cater RVT, RDCS (AE), RDMS Referring Phys: [phone] BRIAN J MUNLEY  IMPRESSIONS   1. Left ventricular ejection fraction, by estimation, is 60 to 65%. The left ventricle has normal function. The left ventricle has no regional wall motion abnormalities. moderate to severe left ventricular hypertrophy. Left ventricular diastolic parameters are consistent with Grade II diastolic dysfunction (pseudonormalization). 2. Right ventricular systolic function is normal. The right ventricular size is normal. 3. Left atrial size was severely dilated. 4. Right atrial size was moderately dilated. 5. The mitral valve is normal in structure. No evidence of mitral valve regurgitation. No evidence of mitral stenosis. 6. The aortic valve is normal in structure. Aortic valve regurgitation is not visualized. No aortic stenosis is present. 7. The inferior vena cava is normal in size with greater than 50% respiratory variability, suggesting right atrial pressure of 3 mmHg.  Comparison(s): EF 75%, severe LVH, + mid LV gradient.  FINDINGS Left Ventricle: Left ventricular ejection fraction, by estimation, is 60 to 65%. The left ventricle has  normal function. The left ventricle has no regional wall motion abnormalities. The left ventricular internal cavity size was normal in size. Moderate to severe left ventricular hypertrophy. Left ventricular diastolic parameters are consistent with Grade II diastolic dysfunction (pseudonormalization).  Right Ventricle: The right ventricular size is normal. No increase in right ventricular wall thickness. Right ventricular systolic function is normal.  Left Atrium: Left atrial size was severely dilated.  Right Atrium: Right atrial size was moderately dilated.  Pericardium: There is no evidence of pericardial effusion.  Mitral Valve: The mitral valve is normal in structure. No evidence of mitral valve regurgitation. No evidence of mitral valve stenosis.  Tricuspid Valve: The tricuspid valve is normal in structure. Tricuspid valve regurgitation is not demonstrated. No evidence of tricuspid stenosis.  Aortic Valve: The aortic valve is normal in structure. Aortic valve regurgitation is not visualized. No aortic stenosis is present. Aortic valve mean gradient measures 5.0 mmHg. Aortic valve peak gradient measures 11.0 mmHg. Aortic valve area, by VTI measures 2.87 cm.  Pulmonic Valve: The pulmonic valve was normal in structure. Pulmonic valve regurgitation is not visualized. No evidence of pulmonic stenosis.  Aorta: The aortic root is normal in size and structure.  Venous: The inferior vena cava is normal in size  with greater than 50% respiratory variability, suggesting right atrial pressure of 3 mmHg.  IAS/Shunts: No atrial level shunt detected by color flow Doppler.   LEFT VENTRICLE PLAX 2D LVIDd:         4.40 cm      Diastology LVIDs:         2.30 cm      LV e' medial:    5.06 cm/s LV PW:         1.80 cm      LV E/e' medial:  14.7 LV IVS:        2.00 cm      LV e' lateral:   6.53 cm/s LVOT diam:     2.00 cm      LV E/e' lateral: 11.4 LV SV:         92 LV SV Index:   40 LVOT Area:      3.14 cm  3D Volume EF: LV Volumes (MOD)            3D EF:        54 % LV vol d, MOD A2C: 110.0 ml LV EDV:       153 ml LV vol d, MOD A4C: 94.2 ml  LV ESV:       71 ml LV vol s, MOD A2C: 41.4 ml  LV SV:        83 ml LV vol s, MOD A4C: 26.4 ml LV SV MOD A2C:     68.6 ml LV SV MOD A4C:     94.2 ml LV SV MOD BP:      71.8 ml  RIGHT VENTRICLE RV S prime:     12.00 cm/s TAPSE (M-mode): 2.0 cm  LEFT ATRIUM              Index        RIGHT ATRIUM           Index LA diam:        5.30 cm  2.30 cm/m   RA Area:     16.80 cm LA Vol (A2C):   160.0 ml 69.32 ml/m  RA Volume:   37.30 ml  16.16 ml/m LA Vol (A4C):   116.0 ml 50.26 ml/m LA Biplane Vol: 136.0 ml 58.92 ml/m AORTIC VALVE                     PULMONIC VALVE AV Area (Vmax):    2.95 cm      PV Vmax:       1.00 m/s AV Area (Vmean):   2.94 cm      PV Peak grad:  4.0 mmHg AV Area (VTI):     2.87 cm AV Vmax:           166.00 cm/s AV Vmean:          104.000 cm/s AV VTI:            0.322 m AV Peak Grad:      11.0 mmHg AV Mean Grad:      5.0 mmHg LVOT Vmax:         156.00 cm/s LVOT Vmean:        97.200 cm/s LVOT VTI:          0.294 m LVOT/AV VTI ratio: 0.91  AORTA Ao Root diam: 3.00 cm Ao Asc diam:  3.80 cm Ao Arch diam: 2.6 cm  MITRAL VALVE  TRICUSPID VALVE MV Area (PHT): 3.70 cm    TR Peak grad:   5.2 mmHg MV Decel Time: 205 msec    TR Vmax:        114.00 cm/s MV E velocity: 74.40 cm/s MV A velocity: 61.40 cm/s  SHUNTS MV E/A ratio:  1.21        Systemic VTI:  0.29 m Systemic Diam: 2.00 cm  Letti Towell Crape MD Electronically signed by Troy Hartzog Crape MD Signature Date/Time: 08/17/2022/10:12:52 AM    Final          ______________________________________________________________________________________________      Risk Assessment/Calculations           Physical Exam VS:  BP 122/68   Pulse 74   Ht 5' 7 (1.702 m)   Wt 284 lb 12.8 oz (129.2 kg)   LMP  (LMP Unknown)   SpO2 97%   BMI 44.61 kg/m         Wt Readings from Last 3 Encounters:  07/28/24 284 lb 12.8 oz (129.2 kg)  06/16/24 284 lb 6.4 oz (129 kg)  02/21/24 282 lb 3.2 oz (128 kg)    GEN: Well nourished, well developed in no acute distress NECK: No JVD; No carotid bruits CARDIAC: RRR, soft murmur 2/6 best heard at right sternal border, rubs, gallops RESPIRATORY:  Clear to auscultation without rales, wheezing or rhonchi  ABDOMEN: Soft, non-tender, non-distended EXTREMITIES: Trace pedal edema; No deformity   ASSESSMENT AND PLAN Left ventricular hypertrophy -NYHA class I, euvolemic, changes on her echo were felt to be related to resistant hypertension.  Resistant hypertension -blood pressure is well-controlled today as it has been at her other office visits, continue indapamide  2.5 mg daily, losartan  100 mg daily, metoprolol  100 mg daily, spironolactone  100 mg daily.  Lab work on 06/16/2024 by her PCP potassium 3.5, creatinine 0.63.  Dyslipidemia-formally followed by her PCP, most recent LDL was very well-controlled at 51, currently on Lipitor 40 mg daily.  Obesity-BMI 44, her PCP has tried to get her approved for Wegovy  however her insurance would not cover this.  Prediabetes-currently on metformin .       Dispo:  1 year.   Signed, Delon JAYSON Hoover, NP

## 2024-07-28 ENCOUNTER — Encounter: Payer: Self-pay | Admitting: Cardiology

## 2024-07-28 ENCOUNTER — Ambulatory Visit: Attending: Cardiology | Admitting: Cardiology

## 2024-07-28 ENCOUNTER — Ambulatory Visit: Admitting: Cardiology

## 2024-07-28 VITALS — BP 122/68 | HR 74 | Ht 67.0 in | Wt 284.8 lb

## 2024-07-28 DIAGNOSIS — E66813 Obesity, class 3: Secondary | ICD-10-CM

## 2024-07-28 DIAGNOSIS — Z6841 Body Mass Index (BMI) 40.0 and over, adult: Secondary | ICD-10-CM

## 2024-07-28 DIAGNOSIS — I1A0 Resistant hypertension: Secondary | ICD-10-CM | POA: Diagnosis not present

## 2024-07-28 DIAGNOSIS — E782 Mixed hyperlipidemia: Secondary | ICD-10-CM | POA: Diagnosis not present

## 2024-07-28 DIAGNOSIS — I517 Cardiomegaly: Secondary | ICD-10-CM | POA: Diagnosis not present

## 2024-07-28 NOTE — Patient Instructions (Signed)
 Medication Instructions:  Your physician recommends that you continue on your current medications as directed. Please refer to the Current Medication list given to you today.  *If you need a refill on your cardiac medications before your next appointment, please call your pharmacy*  Lab Work: NONE If you have labs (blood work) drawn today and your tests are completely normal, you will receive your results only by: MyChart Message (if you have MyChart) OR A paper copy in the mail If you have any lab test that is abnormal or we need to change your treatment, we will call you to review the results.  Testing/Procedures: NONE  Follow-Up: At [facility], you and your health needs are our priority.  As part of our continuing mission to provide you with exceptional heart care, our providers are all part of one team.  This team includes your primary Cardiologist (physician) and Advanced Practice Providers or APPs (Physician Assistants and Nurse Practitioners) who all work together to provide you with the care you need, when you need it.  Your next appointment:   1 year(s)  Provider:   Redell Leiter, MD    We recommend signing up for the patient portal called MyChart.  Sign up information is provided on this After Visit Summary.  MyChart is used to connect with patients for Virtual Visits (Telemedicine).  Patients are able to view lab/test results, encounter notes, upcoming appointments, etc.  Non-urgent messages can be sent to your provider as well.   To learn more about what you can do with MyChart, go to [email or web address].   Other Instructions

## 2024-08-04 ENCOUNTER — Ambulatory Visit (INDEPENDENT_AMBULATORY_CARE_PROVIDER_SITE_OTHER): Admitting: Adult Health

## 2024-08-04 ENCOUNTER — Encounter: Payer: Self-pay | Admitting: Adult Health

## 2024-08-04 VITALS — BP 122/80 | HR 75 | Ht 67.0 in | Wt 283.0 lb

## 2024-08-04 DIAGNOSIS — R0683 Snoring: Secondary | ICD-10-CM | POA: Diagnosis not present

## 2024-08-04 DIAGNOSIS — G4719 Other hypersomnia: Secondary | ICD-10-CM

## 2024-08-04 DIAGNOSIS — Z6841 Body Mass Index (BMI) 40.0 and over, adult: Secondary | ICD-10-CM

## 2024-08-04 NOTE — Progress Notes (Signed)
 @Patient  ID: Kayla Carey, female    DOB: December 29, 1970, 53 y.o.   MRN: [id number]  Chief Complaint  Patient presents with   Consult   Snoring    Referring provider: Sebastian Beverley NOVAK, MD  HPI: 53 year old female seen for sleep consult August 04, 2024 for snoring, restless sleep and daytime sleepiness Medical history significant for diastolic heart failure, hypertension, diabetes    TEST/EVENTS :  2D echo November 2023 EF 60 to 65%, grade 2 diastolic dysfunction, RV size normal, RV systolic function normal, left atrium severely dilated right atrium moderately dilated  Discussed the use of AI scribe software for clinical note transcription with the patient, who gave verbal consent to proceed.  History of Present Illness Kayla Carey is a 53 year old female with hypertension, hyperlipidemia, and diabetes who presents with snoring and suspected sleep apnea. She was referred by her primary doctor for evaluation of snoring and suspected sleep apnea.  She experiences snoring, restless sleep, daytime sleepiness.  her sleep is restless, and she has daytime sleepiness, feeling tired when sitting down to watch TV or rest. She works night shifts from 7 PM to 7 AM, seven days a week, for over three years and plans to switch to a 3 PM to 11 PM shift, five days a week, to reduce work hours. She does not use sleep aids and has difficulty falling asleep during the day, resulting in about five to six hours of sleep.  She denies any history of stroke.  Occasionally takes a nap.  No symptoms suspicious for cataplexy or sleep paralysis.  Her past medical history includes hypertension, hyperlipidemia, diabetes, cervical cancer treated with hysterectomy in 2019, and a benign breast cyst removed at age 89. She notes her weight is higher than usual.  Current weight is 283 pounds with a BMI at 44  Socially, she lives with her family. , and is divorced. She does not smoke, drink alcohol, or use drugs. 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  Originally from [place] came to the [place]  years ago.       08/04/2024   10:00 AM  Results of the Epworth flowsheet  Sitting and reading 2  Watching TV 2  Sitting, inactive in a public place (e.g. a theatre or a meeting) 2  As a passenger in a car for an hour without a break 1  Lying down to rest in the afternoon when circumstances permit 2  Sitting and talking to someone 2  Sitting quietly after a lunch without alcohol 3  In a car, while stopped for a few minutes in traffic 2  Total score 16      No Known Allergies  Immunization History  Administered Date(s) Administered   Influenza-Unspecified 08/22/2021    Past Medical History:  Diagnosis Date   Chronic diastolic (congestive) heart failure [facility])    cardiologist-  dr monetta   GERD (gastroesophageal reflux disease)    per pt just drinks water    Hyperlipidemia 04/16/2023   Hypertension 2012   Hypertensive heart disease 08/01/2018   IDA (iron deficiency anemia)    Malignant neoplasm of cervix (HCC)    Obesity, unspecified 02/21/2024   Prediabetes 01/30/2023   Vertigo 01/30/2023    Tobacco History: Social History   Tobacco Use  Smoking Status Former   Current packs/day: 0.00   Average packs/day: 0.5 packs/day for 20.0 years (10.0 ttl pk-yrs)   Types: Cigarettes  Start date: 07/15/1998   Quit date: 07/15/2018   Years since quitting: 6.0  Smokeless Tobacco Never   Counseling given: Not Answered   Outpatient Medications Prior to Visit  Medication Sig Dispense Refill   Ascorbic Acid (VITAMIN C) 1000 MG tablet Take 1,000 mg by mouth daily.  (Patient taking differently: Take 500 mg by mouth 2 (two) times daily.)     aspirin  EC 81 MG tablet Take 1 tablet (81 mg total) by mouth daily. 90 tablet 3   atorvastatin  (LIPITOR) 40 MG tablet Take 1 tablet (40 mg total) by mouth daily. 90 tablet 3   B Complex Vitamins (B  COMPLEX PO) Take 1 tablet by mouth daily.     Cholecalciferol (VITAMIN D3) 50 MCG (2000 UT) capsule Take 1 capsule (2,000 Units total) by mouth daily. 90 capsule 3   ferrous sulfate  325 (65 FE) MG tablet Take 1 tablet (325 mg total) by mouth daily with breakfast. 90 tablet 3   indapamide  (LOZOL ) 2.5 MG tablet Take 1 tablet (2.5 mg total) by mouth daily. 90 tablet 0   losartan  (COZAAR ) 100 MG tablet Take 1 tablet (100 mg total) by mouth daily. 90 tablet 3   metFORMIN  (GLUCOPHAGE -XR) 750 MG 24 hr tablet Take 1 tablet (750 mg total) by mouth daily with breakfast. 90 tablet 3   metoprolol  succinate (TOPROL -XL) 100 MG 24 hr tablet Take 1 tablet (100 mg total) by mouth daily. Take with or immediately following a meal. 90 tablet 3   omeprazole  (PRILOSEC) 40 MG capsule Take 1 capsule (40 mg total) by mouth daily. 30 capsule 0   spironolactone  (ALDACTONE ) 100 MG tablet Take 1 tablet (100 mg total) by mouth daily. 90 tablet 3   No facility-administered medications prior to visit.     Review of Systems:   Constitutional:   No  weight loss, night sweats,  Fevers, chills, +fatigue, or  lassitude.  HEENT:   No headaches,  Difficulty swallowing,  Tooth/dental problems, or  Sore throat,                No sneezing, itching, ear ache, nasal congestion, post nasal drip,   CV:  No chest pain,  Orthopnea, PND, +swelling in lower extremities, No anasarca, dizziness, palpitations, syncope.   GI  No heartburn, indigestion, abdominal pain, nausea, vomiting, diarrhea, change in bowel habits, loss of appetite, bloody stools.   Resp: No shortness of breath with exertion or at rest.  No excess mucus, no productive cough,  No non-productive cough,  No coughing up of blood.  No change in color of mucus.  No wheezing.  No chest wall deformity  Skin: no rash or lesions.  GU: no dysuria, change in color of urine, no urgency or frequency.  No flank pain, no hematuria   MS:  No joint pain or swelling.  No decreased range  of motion.  No back pain.    Physical Exam  Wt 283 lb (128.4 kg)   LMP  (LMP Unknown)   BMI 44.32 kg/m   GEN: A/Ox3; pleasant , NAD, well nourished    HEENT:  [place]/AT,  , NOSE-clear, THROAT-clear, no lesions, no postnasal drip or exudate noted. Class IV MP airway   NECK:  Supple w/ fair ROM; no JVD; normal carotid impulses w/o bruits; no thyromegaly or nodules palpated; no lymphadenopathy.    RESP  Clear  P & A; w/o, wheezes/ rales/ or rhonchi. no accessory muscle use, no dullness to percussion  CARD:  RRR, GR1-2  SM  tr  peripheral edema, pulses intact, no cyanosis or clubbing.  GI:   Soft & nt; nml bowel sounds; no organomegaly or masses detected.   Musco: Warm bil, no deformities or joint swelling noted.   Neuro: alert, no focal deficits noted.    Skin: Warm, no lesions or rashes    Lab Results:  CBC    Component Value Date/Time   WBC 4.4 06/16/2024 1619   RBC 4.21 06/16/2024 1619   HGB 12.7 06/16/2024 1619   HGB 14.0 12/22/2019 1651   HCT 37.8 06/16/2024 1619   HCT 40.5 12/22/2019 1651   PLT 221.0 06/16/2024 1619   PLT 218 12/22/2019 1651   MCV 89.7 06/16/2024 1619   MCV 92 12/22/2019 1651   MCH 30.3 08/18/2020 1902   MCHC 33.6 06/16/2024 1619   RDW 12.9 06/16/2024 1619   RDW 12.4 12/22/2019 1651   LYMPHSABS 2.1 06/16/2024 1619   MONOABS 0.3 06/16/2024 1619   EOSABS 0.0 06/16/2024 1619   BASOSABS 0.0 06/16/2024 1619    BMET    Component Value Date/Time   NA 138 06/16/2024 1619   NA 141 08/15/2022 1110   K 3.5 06/16/2024 1619   CL 100 06/16/2024 1619   CO2 29 06/16/2024 1619   GLUCOSE 82 06/16/2024 1619   BUN 17 06/16/2024 1619   BUN 15 08/15/2022 1110   CREATININE 0.63 06/16/2024 1619   CALCIUM  9.3 06/16/2024 1619   GFRNONAA >60 08/18/2020 1902   GFRAA 101 12/22/2019 1651    BNP No results found for: BNP  ProBNP    Component Value Date/Time   PROBNP 82 06/16/2024 1619    Imaging: No results found.  Administration History      None           No data to display          No results found for: NITRICOXIDE      Assessment & Plan:   Assessment and Plan Assessment & Plan Suspected obstructive sleep apnea   She presents with symptoms such as snoring, restless sleep, and daytime sleepiness, suggestive of obstructive sleep apnea. Her night shifts and demanding work schedule contribute to sleep disturbances. There is concern for sleep apnea due to comorbid conditions like hypertension, hyperlipidemia, and type 2 diabetes mellitus, which can worsen if untreated.  Order a home sleep study through [facility]. Provide information on sleep apnea via MyChart. Schedule a follow-up video visit in six weeks to discuss sleep study results. Discuss potential treatment options, including CPAP, weight loss, and dental appliances if sleep apnea is confirmed.  Insomnia, inadequate sleep regimen with shiftwork.-Healthy sleep regimen discussed in detail.  Obesity   Her BMI of 44  a risk factor for obstructive sleep apnea and other comorbid conditions. Weight loss is recommended as part of the treatment plan for sleep apnea, but achieving weight loss can be challenging without adequate sleep.  Hypertension   Hypertension is part of her medical history. Untreated sleep apnea can contribute to elevated blood pressure. Managing sleep apnea may help control hypertension.          Madelin Stank, NP 08/04/2024

## 2024-08-04 NOTE — Patient Instructions (Addendum)
 Set up for home sleep study  Work on healthy weight loss  Do not drive if sleepy  Follow up in 6 weeks to review results and treatment plan -[facility]

## 2024-09-17 ENCOUNTER — Other Ambulatory Visit: Payer: Self-pay | Admitting: Family Medicine

## 2024-09-17 DIAGNOSIS — R7303 Prediabetes: Secondary | ICD-10-CM

## 2024-09-17 DIAGNOSIS — I1A0 Resistant hypertension: Secondary | ICD-10-CM

## 2024-09-17 MED ORDER — INDAPAMIDE 2.5 MG PO TABS: 2.5000 mg | ORAL_TABLET | Freq: Every day | ORAL | 0 refills | Status: AC

## 2024-09-17 NOTE — Telephone Encounter (Signed)
 Refill request received for multiple medications. QNC:Wnwz LOV:06/16/2024 Last refill: Indapamide  refilled. The others were already refilled earlier today.

## 2024-09-17 NOTE — Telephone Encounter (Signed)
 Copied from CRM [id number]. Topic: Clinical - Medication Refill >> Sep 17, 2024 12:27 PM Drema MATSU wrote: Medication: metoprolol  succinate (TOPROL -XL) 100 MG 24 hr tablet, losartan  (COZAAR ) 100 MG tablet, spironolactone  (ALDACTONE ) 100 MG tablet, metFORMIN  (GLUCOPHAGE -XR) 750 MG 24 hr tablet, indapamide  (LOZOL ) 2.5 MG tablet  *requesting 3 month supply   Has the patient contacted their pharmacy? Yes (Agent: If no, request that the patient contact the pharmacy for the refill. If patient does not wish to contact the pharmacy document the reason why and proceed with request.) advised that prescriptions had expired  (Agent: If yes, when and what did the pharmacy advise?)  This is the patient's preferred pharmacy:  [facility] [address] ([address]), [place] - [address] [place] ([place]) [place] [address] Phone: [phone] Fax: [phone]  Is this the correct pharmacy for this prescription? Yes If no, delete pharmacy and type the correct one.   Has the prescription been filled recently? Yes  Is the patient out of the medication? No 6 days left   Has the patient been seen for an appointment in the last year OR does the patient have an upcoming appointment? Yes  Can we respond through MyChart? Yes  Agent: Please be advised that Rx refills may take up to 3 business days. We ask that you follow-up with your pharmacy.
